# Patient Record
Sex: Female | Born: 1985 | Race: White | Hispanic: No | Marital: Single | Smoking: Former smoker
Health system: Southern US, Community
[De-identification: ages and names within clinical notes are randomized; demographics above are authoritative.]

## PROBLEM LIST (undated history)

## (undated) ENCOUNTER — Inpatient Hospital Stay (HOSPITAL_COMMUNITY): Payer: Self-pay

## (undated) DIAGNOSIS — N390 Urinary tract infection, site not specified: Secondary | ICD-10-CM

## (undated) DIAGNOSIS — F419 Anxiety disorder, unspecified: Secondary | ICD-10-CM

## (undated) DIAGNOSIS — IMO0002 Reserved for concepts with insufficient information to code with codable children: Secondary | ICD-10-CM

## (undated) DIAGNOSIS — M419 Scoliosis, unspecified: Secondary | ICD-10-CM

## (undated) DIAGNOSIS — E7800 Pure hypercholesterolemia, unspecified: Secondary | ICD-10-CM

## (undated) HISTORY — PX: OTHER SURGICAL HISTORY: SHX169

## (undated) HISTORY — PX: BACK SURGERY: SHX140

---

## 2009-01-23 ENCOUNTER — Encounter: Payer: Self-pay | Admitting: Pulmonary Disease

## 2009-06-25 ENCOUNTER — Emergency Department (HOSPITAL_COMMUNITY): Admission: EM | Admit: 2009-06-25 | Discharge: 2009-06-25 | Payer: Self-pay | Admitting: Family Medicine

## 2010-06-26 ENCOUNTER — Emergency Department (HOSPITAL_COMMUNITY): Admission: EM | Admit: 2010-06-26 | Discharge: 2010-06-26 | Payer: Self-pay | Admitting: Emergency Medicine

## 2010-11-06 ENCOUNTER — Emergency Department (HOSPITAL_COMMUNITY)
Admission: EM | Admit: 2010-11-06 | Discharge: 2010-11-06 | Payer: Self-pay | Source: Home / Self Care | Admitting: Family Medicine

## 2010-11-24 ENCOUNTER — Encounter: Payer: Self-pay | Admitting: Pulmonary Disease

## 2010-11-24 ENCOUNTER — Ambulatory Visit: Payer: Self-pay | Admitting: Pulmonary Disease

## 2010-11-24 DIAGNOSIS — K219 Gastro-esophageal reflux disease without esophagitis: Secondary | ICD-10-CM | POA: Insufficient documentation

## 2010-11-24 DIAGNOSIS — R0602 Shortness of breath: Secondary | ICD-10-CM | POA: Insufficient documentation

## 2010-11-24 DIAGNOSIS — Z9189 Other specified personal risk factors, not elsewhere classified: Secondary | ICD-10-CM | POA: Insufficient documentation

## 2010-11-24 DIAGNOSIS — J45909 Unspecified asthma, uncomplicated: Secondary | ICD-10-CM | POA: Insufficient documentation

## 2010-11-24 DIAGNOSIS — J309 Allergic rhinitis, unspecified: Secondary | ICD-10-CM | POA: Insufficient documentation

## 2010-12-16 ENCOUNTER — Ambulatory Visit
Admission: RE | Admit: 2010-12-16 | Discharge: 2010-12-16 | Payer: Self-pay | Source: Home / Self Care | Attending: Pulmonary Disease | Admitting: Pulmonary Disease

## 2010-12-16 ENCOUNTER — Other Ambulatory Visit: Payer: Self-pay | Admitting: Pulmonary Disease

## 2010-12-16 ENCOUNTER — Encounter: Payer: Self-pay | Admitting: Pulmonary Disease

## 2010-12-16 LAB — CBC WITH DIFFERENTIAL/PLATELET
Basophils Absolute: 0 10*3/uL (ref 0.0–0.1)
Basophils Relative: 0.3 % (ref 0.0–3.0)
Eosinophils Absolute: 0 10*3/uL (ref 0.0–0.7)
Eosinophils Relative: 0.5 % (ref 0.0–5.0)
HCT: 42.2 % (ref 36.0–46.0)
Hemoglobin: 14.7 g/dL (ref 12.0–15.0)
Lymphocytes Relative: 24.2 % (ref 12.0–46.0)
Lymphs Abs: 1.5 10*3/uL (ref 0.7–4.0)
MCHC: 34.8 g/dL (ref 30.0–36.0)
MCV: 98.8 fl (ref 78.0–100.0)
Monocytes Absolute: 0.6 10*3/uL (ref 0.1–1.0)
Monocytes Relative: 8.8 % (ref 3.0–12.0)
Neutro Abs: 4.2 10*3/uL (ref 1.4–7.7)
Neutrophils Relative %: 66.2 % (ref 43.0–77.0)
Platelets: 241 10*3/uL (ref 150.0–400.0)
RBC: 4.28 Mil/uL (ref 3.87–5.11)
RDW: 13.4 % (ref 11.5–14.6)
WBC: 6.4 10*3/uL (ref 4.5–10.5)

## 2010-12-16 LAB — BASIC METABOLIC PANEL
BUN: 14 mg/dL (ref 6–23)
CO2: 27 mEq/L (ref 19–32)
Calcium: 9.4 mg/dL (ref 8.4–10.5)
Chloride: 106 mEq/L (ref 96–112)
Creatinine, Ser: 0.7 mg/dL (ref 0.4–1.2)
GFR: 101.84 mL/min (ref >60.00)
Glucose, Bld: 69 mg/dL — ABNORMAL LOW (ref 70–99)
Potassium: 3.9 mEq/L (ref 3.5–5.1)
Sodium: 141 mEq/L (ref 135–145)

## 2010-12-16 LAB — HEPATIC FUNCTION PANEL
ALT: 12 U/L (ref 0–35)
AST: 17 U/L (ref 0–37)
Albumin: 4.1 g/dL (ref 3.5–5.2)
Alkaline Phosphatase: 60 U/L (ref 39–117)
Bilirubin, Direct: 0.1 mg/dL (ref 0.0–0.3)
Total Bilirubin: 0.6 mg/dL (ref 0.3–1.2)
Total Protein: 7.2 g/dL (ref 6.0–8.3)

## 2010-12-18 ENCOUNTER — Ambulatory Visit
Admission: RE | Admit: 2010-12-18 | Discharge: 2010-12-18 | Payer: Self-pay | Source: Home / Self Care | Attending: Pulmonary Disease | Admitting: Pulmonary Disease

## 2010-12-18 ENCOUNTER — Encounter: Payer: Self-pay | Admitting: Pulmonary Disease

## 2010-12-21 LAB — CONVERTED CEMR LAB: IgE (Immunoglobulin E), Serum: <1.5 intl units/mL (ref 0.0–180.0)

## 2010-12-23 ENCOUNTER — Encounter: Payer: Self-pay | Admitting: Pulmonary Disease

## 2010-12-24 ENCOUNTER — Encounter: Payer: Self-pay | Admitting: Pulmonary Disease

## 2010-12-29 ENCOUNTER — Telehealth (INDEPENDENT_AMBULATORY_CARE_PROVIDER_SITE_OTHER): Payer: Self-pay | Admitting: *Deleted

## 2011-01-07 ENCOUNTER — Ambulatory Visit: Admit: 2011-01-07 | Payer: Self-pay | Admitting: Pulmonary Disease

## 2011-01-07 ENCOUNTER — Encounter: Payer: Self-pay | Admitting: Pulmonary Disease

## 2011-01-07 ENCOUNTER — Ambulatory Visit (INDEPENDENT_AMBULATORY_CARE_PROVIDER_SITE_OTHER): Payer: Self-pay | Admitting: Pulmonary Disease

## 2011-01-07 DIAGNOSIS — K219 Gastro-esophageal reflux disease without esophagitis: Secondary | ICD-10-CM

## 2011-01-07 DIAGNOSIS — R0602 Shortness of breath: Secondary | ICD-10-CM

## 2011-01-07 DIAGNOSIS — Z9189 Other specified personal risk factors, not elsewhere classified: Secondary | ICD-10-CM

## 2011-01-07 DIAGNOSIS — J309 Allergic rhinitis, unspecified: Secondary | ICD-10-CM

## 2011-01-07 DIAGNOSIS — J45909 Unspecified asthma, uncomplicated: Secondary | ICD-10-CM

## 2011-01-07 NOTE — Miscellaneous (Signed)
Summary: Orders Update pft charges  Clinical Lists Changes  Orders: Added new Service order of Carbon Monoxide diffusing w/capacity (94720) - Signed Added new Service order of Lung Volumes (94240) - Signed Added new Service order of Spirometry (Pre & Post) (94060) - Signed 

## 2011-01-07 NOTE — Assessment & Plan Note (Signed)
Summary: rov 3 wks ///kp   Primary Provider/Referring Provider:  Dr. Cheri Rous in [place], [place]  CC:  3 month asthma follow up. Pt states breathing is the same. No new complaints.  History of Present Illness: 25 yo female with dyspnea, asthma, rhinitis, GERD, atypical chest pain, and ?vcd  She has been breathing better through her nose.  Her chest pain has resolved with therapy for reflux.  She still gets winded with walking after about 1/2 mile.  She feels like her asthma is coming on when she walks to much.  She feels like something is stuck in her throat.  She was confused about how to use her inhaler.  She is now using prevacid every other day.   Preventive Screening-Counseling & Management  Alcohol-Tobacco     Alcohol drinks/day: <1     Smoking Status: never  Current Medications (verified): 1)  Symbicort 160-4.5 Mcg/act Aero (Budesonide-Formoterol Fumarate) .... 2 Puffs Two Times A Day 2)  Proair Hfa 108 (90 Base) Mcg/act Aers (Albuterol Sulfate) .... 2 Puffs As Needed For Shortness of Breath 3)  Singulair 10 Mg Tabs (Montelukast Sodium) .... One By Mouth At Bedtime 4)  Prevacid 15 Mg Cpdr (Lansoprazole) .... Take 1 Tablet By Mouth Once A Day (Otc Pt Unsure of Strength) 5)  Fluticasone Propionate 50 Mcg/act Susp (Fluticasone Propionate) .... Two Sprays Once Daily  Allergies (verified): No Known Drug Allergies  Vital Signs:  Patient profile:   25 year old female Height:      67 inches Weight:      169.2 pounds BMI:     26.60 O2 Sat:      98 % on Room air Temp:     98.1 degrees F oral Pulse rate:   80 / minute BP sitting:   106 / 70  (left arm) Cuff size:   regular  Vitals Entered By: Zackery Barefoot CMA (December 16, 2010 9:11 AM)  O2 Flow:  Room air CC: 3 month asthma follow up. Pt states breathing is the same. No new complaints Comments Medications reviewed with patient Verified contact number and pharmacy with patient Zackery Barefoot CMA  December 16, 2010  9:11 AM    Physical Exam  General:  normal appearance and healthy appearing.   Nose:  Narrow nasal angles, clear drainage, no tenderness Mouth:  Retrognathic, MP 4, no exudate Neck:  no masses, thyromegaly, or abnormal cervical nodes Lungs:  clear bilaterally to auscultation and percussion Heart:  regular rate and rhythm, S1, S2 without murmurs, rubs, gallops, or clicks Extremities:  no clubbing, cyanosis, edema, or deformity noted Neurologic:  normal CN II-XII and strength normal.   Cervical Nodes:  no significant adenopathy Psych:  alert and cooperative; normal mood and affect; normal attention span and concentration   Impression & Recommendations:  Problem # 1:  DYSPNEA (ICD-786.05) She has partial improvement.  Will arrange for pulmonary function testing to further assess.  Will then determine is she needs to have CPST.  May also need further evaluation for VCD.  Problem # 2:  ASTHMA (ICD-493.90) She is to continue on symbicort, singulair, and as needed proair.  Reviewed proper use of her inhalers.  Will reassess after review of her labs and PFT.  Problem # 3:  ALLERGIC RHINITIS (ICD-477.9) Continue flonase and singulair.  Will check RAST, IgE, and hypersensitivity panel.  Problem # 4:  GERD (ICD-530.81) She can use as needed PPI.  Problem # 5:  CHEST PAIN, ATYPICAL, HX OF (ICD-V15.89) Likely from  GERD.  Improved with PPI therapy.  Medications Added to Medication List This Visit: 1)  Prevacid 15 Mg Cpdr (Lansoprazole) .... Take 1 tablet by mouth once a day (otc pt unsure of strength)  Complete Medication List: 1)  Symbicort 160-4.5 Mcg/act Aero (Budesonide-formoterol fumarate) .... 2 puffs two times a day 2)  Proair Hfa 108 (90 Base) Mcg/act Aers (Albuterol sulfate) .... 2 puffs as needed for shortness of breath 3)  Singulair 10 Mg Tabs (Montelukast sodium) .... One by mouth at bedtime 4)  Prevacid 15 Mg Cpdr (Lansoprazole) .... Take 1 tablet by mouth once a day (otc pt  unsure of strength) 5)  Fluticasone Propionate 50 Mcg/act Susp (Fluticasone propionate) .... Two sprays once daily  Other Orders: Est. Patient Level IV (16109) Full Pulmonary Function Test (PFT) TLB-BMP (Basic Metabolic Panel-BMET) (80048-METABOL) TLB-CBC Platelet - w/Differential (85025-CBCD) TLB-Hepatic/Liver Function Pnl (80076-HEPATIC) T-"RAST" (Allergy Full Profile) IGE (60454-09811) T-Hypersens Panel (86331/86609-85902)  Patient Instructions: 1)  Lab test today 2)  Will schedule breathing test (PFT) 3)  Continue symbicort two puffs two times a day 4)  Continue singulair 10mg  at bedtime 5)  Continue flonase two sprays once daily 6)  Prevacid 15 mg as needed for heartburn 7)  Follow up in 3 to 4 weeks

## 2011-01-07 NOTE — Procedures (Signed)
Summary: Spirometry / [place]  Spirometry / [facility]   Imported By: Lennie Odor 12/28/2010 15:39:35  _____________________________________________________________________  External Attachment:    Type:   Image     Comment:   External Document

## 2011-01-07 NOTE — Assessment & Plan Note (Signed)
Summary: self-referral SOB//jrc   Primary Provider/Referring Provider:  Dr. Cheri Rous in [place], [place]  CC:  Patient is self referred for asthma evaluation...pt c/o increased SOB x3 months and using her Proair 5-6 times daily.  History of Present Illness: 25 yo female fore evaluation of dyspnea in setting of asthma.  She was diagnosed with asthma at age 22.  She had trouble with her breathing associated with cough and wheeze.  This started after she had a cold.  She has used inhaler therapy intermittently since then, and currently has been using symbicort and proair.  These have helped, but don't seem to be as effective now.  As a result she has decided to have further pulmonary evaluation.  She was seen in the emergency room on Dec. 2 for throat swelling.  She was given prednisone and flonase.  It does not appear that she used either of these.  She states that she has not been on prednisone for the past year, and has not used nasal sprays before.  She has been getting a runny nose.  She feels like something is stuck in her throat, but she can't really cough anything up.  She notices her cough is worse with activity.  She has been getting chest tightness, but not so much wheeze.  She feels she has more trouble getting air into her lungs.  She denies fever, hemoptysis, epistaxis, rashes, gland swelling, or joint swelling.  She gets frequent episodes of heartburn.  This happens at least twice per day.  She is not taking any medicine for this.  She gets seasonal allergies.  She has noticed most problems related to dust and pollen.  Summer seems to be her worst season.  She has never had allergy testing done before.  She has never seen an allergist or pulmonologist before.  She had PFT's years ago.  There is no history of pneumonia or TB.  She denies smoking, but has second hand exposure from her Father and boyfriend.  She works as a Advertising copywriter, and has noticed trouble around cleaning product  fumes.  Her Father breeds birds.  She had to move out of his house because the exposure to the birds made her breathing worse.  She does have a pet parrot.  She denies recent sick exposures or travel history.  She had difficulty doing spirometry today, and therefore test can not be interpretted.  CXR  Procedure date:  06/26/2010  Findings:       CHEST - 2 VIEW    Comparison: None.    Findings: The lungs are clear bilaterally.  There are no confluent   airspace opacities, pleural effusions or pneumothoraces seen. A   metallic foreign body seen projecting in the mid chest, correlated   to a piercing.  The cardiac silhouette is normal in size and   contour. The upper abdomen and osseous structures are unremarkable.    IMPRESSION:   No acute cardiopulmonary disease.  CXR  Procedure date:  11/24/2010  Findings:      CHEST - 2 VIEW    Comparison: June 26, 2010    Findings: The cardiac silhouette, mediastinum, pulmonary   vasculature are within normal limits.  Both lungs are clear.   There is no acute bony abnormality.    IMPRESSION:   There is no evidence of acute cardiac or pulmonary process.   Imaging Exam  Procedure date:  11/06/2010  Findings:      NECK SOFT TISSUES - 1+ VIEW  Comparison: None.    Findings: No opaque foreign body is seen.  The hypopharyngeal   airway appears normal and the epiglottis appears normal.  The   cervical vertebrae are in normal alignment.    IMPRESSION:   Negative lateral soft tissue view of the neck.  No opaque foreign   body is seen.    Preventive Screening-Counseling & Management  Alcohol-Tobacco     Alcohol drinks/day: <1     Smoking Status: never  Current Medications (verified): 1)  Symbicort 160-4.5 Mcg/act Aero (Budesonide-Formoterol Fumarate) .... 2 Puffs Two Times A Day 2)  Proair Hfa 108 (90 Base) Mcg/act Aers (Albuterol Sulfate) .... 2 Puffs As Needed For Shortness of Breath  Allergies (verified): No Known  Drug Allergies  Past History:  Past Medical History: Asthma Allergic rhinitis Atypical chest pain  Past Surgical History: None  Family History: Paternal grandfather - Prostate cancer, Heart disease, Bone Cancer, DM Paternal grandmother - Colon cancer, allergies  Social History: Single.  No children.  Works as Advertising copywriter.  Second hand tobacco exposure from Father and Boyfriend.Smoking Status:  never Alcohol drinks/day:  <1  Review of Systems       The patient complains of shortness of breath with activity, productive cough, acid heartburn, loss of appetite, tooth/dental problems, and anxiety.  The patient denies shortness of breath at rest, non-productive cough, coughing up blood, chest pain, irregular heartbeats, indigestion, weight change, abdominal pain, difficulty swallowing, sore throat, headaches, nasal congestion/difficulty breathing through nose, sneezing, itching, ear ache, depression, hand/feet swelling, joint stiffness or pain, rash, change in color of mucus, and fever.    Vital Signs:  Patient profile:   25 year old female Height:      67 inches (170.18 cm) Weight:      172.38 pounds (78.35 kg) BMI:     27.10 O2 Sat:      100 % on Room air Temp:     98.1 degrees F (36.72 degrees C) oral Pulse rate:   91 / minute BP sitting:   108 / 70  (left arm) Cuff size:   regular  Vitals Entered By: Michel Bickers CMA (November 24, 2010 3:43 PM)  O2 Sat at Rest %:  100 O2 Flow:  Room air CC: Patient is self referred for asthma evaluation...pt c/o increased SOB x3 months and using her Proair 5-6 times daily Is Patient Diabetic? No Comments Medications reviewed with patient Michel Bickers [facility]  November 24, 2010 3:44 PM   Physical Exam  General:  normal appearance and healthy appearing.   Eyes:  PERRLA and EOMI.   Ears:  TMs intact and clear with normal canals Nose:  Narrow nasal angles, clear drainage, no tenderness Mouth:  Retrognathic, MP 4, no exudate Neck:  no masses,  thyromegaly, or abnormal cervical nodes Chest Wall:  no deformities noted Lungs:  clear bilaterally to auscultation and percussion Heart:  regular rate and rhythm, S1, S2 without murmurs, rubs, gallops, or clicks Abdomen:  bowel sounds positive; abdomen soft and non-tender without masses, or organomegaly Msk:  no deformity or scoliosis noted with normal posture Pulses:  pulses normal Extremities:  no clubbing, cyanosis, edema, or deformity noted Neurologic:  normal CN II-XII and strength normal.   Skin:  intact without lesions or rashes Cervical Nodes:  no significant adenopathy Psych:  alert and cooperative; normal mood and affect; normal attention span and concentration   Impression & Recommendations:  Problem # 1:  DYSPNEA (ICD-786.05) This is likely related to asthma.  She  may also have a component of vocal cord dysfunction related to rhinitis with post-nasal drip and reflux.  She does have exposure to birds, but her chest xray and clinical exam are not in favor of allergic alveolitis as a cause of her dyspnea.  There is nothing in her history or exam to suggest cardiac disease, thrombo-embolic disease, interstitial lung disease or muscle weakness as a cause of her dyspnea.  Will defer evaluation for these disorders.  Problem # 2:  ASTHMA (ICD-493.90) Will continue symbicort and as needed proair.  Will add singulair to her regimen.  Depending on her response she may need further pulmonary function testing.  Problem # 3:  ALLERGIC RHINITIS (ICD-477.9) She likely has post-nasal drip contributing to her symptoms.  Will have her use nasal irrigation and fluticasone on a regular basis until her return visit.  If she has not improved, she would then need further allergy testing.  Problem # 4:  GERD (ICD-530.81) She likely has reflux contributing to her upper airway irritation.  Will have her use omeprazole once daily until return visit, and then re-assess whether she needs further  evaluation for reflux.  Problem # 5:  CHEST PAIN, ATYPICAL, HX OF (ICD-V15.89) This may be related to reflux.    Medications Added to Medication List This Visit: 1)  Symbicort 160-4.5 Mcg/act Aero (Budesonide-formoterol fumarate) .... 2 puffs two times a day 2)  Proair Hfa 108 (90 Base) Mcg/act Aers (Albuterol sulfate) .... 2 puffs as needed for shortness of breath 3)  Singulair 10 Mg Tabs (Montelukast sodium) .... One by mouth at bedtime 4)  Omeprazole 20 Mg Cpdr (Omeprazole) .... One by mouth once daily 5)  Fluticasone Propionate 50 Mcg/act Susp (Fluticasone propionate) .... Two sprays once daily  Complete Medication List: 1)  Symbicort 160-4.5 Mcg/act Aero (Budesonide-formoterol fumarate) .... 2 puffs two times a day 2)  Proair Hfa 108 (90 Base) Mcg/act Aers (Albuterol sulfate) .... 2 puffs as needed for shortness of breath 3)  Singulair 10 Mg Tabs (Montelukast sodium) .... One by mouth at bedtime 4)  Omeprazole 20 Mg Cpdr (Omeprazole) .... One by mouth once daily 5)  Fluticasone Propionate 50 Mcg/act Susp (Fluticasone propionate) .... Two sprays once daily  Other Orders: Spirometry w/Graph (94010) New Patient Level IV (16109) T-2 View CXR (71020TC)  Patient Instructions: 1)  Continue symbicort two puffs two times a day 2)  Singulair 10 mg at bedtime 3)  Nasal irrigation (Salt water saline spray) once daily  4)  Fluticasone two sprays in each nostril once daily  5)  Omeprazole 20 mg once daily for heartburn 6)  Follow up in 3 weeks Prescriptions: FLUTICASONE PROPIONATE 50 MCG/ACT SUSP (FLUTICASONE PROPIONATE) two sprays once daily  #1 x 3   Entered and Authorized by:   Coralyn Helling MD   Signed by:   Coralyn Helling MD on 11/24/2010   Method used:   Print then Give to Patient   RxID:   [id number] OMEPRAZOLE 20 MG CPDR (OMEPRAZOLE) one by mouth once daily  #30 x 3   Entered and Authorized by:   Coralyn Helling MD   Signed by:   Coralyn Helling MD on 11/24/2010   Method used:   Print  then Give to Patient   RxID:   [id number] SINGULAIR 10 MG TABS (MONTELUKAST SODIUM) one by mouth at bedtime  #30 x 3   Entered and Authorized by:   Coralyn Helling MD   Signed by:   Coralyn Helling MD on 11/24/2010  Method used:   Print then Give to Patient   RxID:   [id number]    Immunization History:  Influenza Immunization History:    Influenza:  historical (09/05/2010)

## 2011-01-07 NOTE — Miscellaneous (Signed)
Summary: Pulmonary function test   Pulmonary Function Test Date: 12/18/2010 Height (in.): 67 Gender: Female  Pre-Spirometry FVC    Value: 3.92 L/min   Pred: 4.28 L/min     % Pred: 92 % FEV1    Value: 3.46 L     Pred: 3.43 L     % Pred: 101 % FEV1/FVC  Value: 88 %     Pred: 80 %     % Pred: . % FEF 25-75  Value: 4.30 L/min   Pred: 3.85 L/min     % Pred: 112 %  Post-Spirometry FVC    Value: 3.92 L/min   Pred: 4.28 L/min     % Pred: 92 % FEV1    Value: 3.54 L     Pred: 3.43 L     % Pred: 102 % FEV1/FVC  Value: 90 %     Pred: 80 %     % Pred: . % FEF 25-75  Value: 4.55 L/min   Pred: 3.85 L/min     % Pred: 118 %  Lung Volumes TLC    Value: 5.08 L   % Pred: 88 % RV    Value: 1.13 L   % Pred: 64 % DLCO    Value: 25.6 %   % Pred: 95 % DLCO/VA  Value: 5.78 %   % Pred: 126 %  Comments: Normal spirometry.  No bronchodilator response.  Isolated decrease in residual volume, otherwise normal lung volumes.  Normal diffusion capacity. Clinical Lists Changes  Observations: Added new observation of PFT COMMENTS: Normal spirometry.  No bronchodilator response.  Isolated decrease in residual volume, otherwise normal lung volumes.  Normal diffusion capacity. (12/18/2010 13:15) Added new observation of DLCO/VA%EXP: 126 % (12/18/2010 13:15) Added new observation of DLCO/VA: 5.78 % (12/18/2010 13:15) Added new observation of DLCO % EXPEC: 95 % (12/18/2010 13:15) Added new observation of DLCO: 25.6 % (12/18/2010 13:15) Added new observation of RV % EXPECT: 64 % (12/18/2010 13:15) Added new observation of RV: 1.13 L (12/18/2010 13:15) Added new observation of TLC % EXPECT: 88 % (12/18/2010 13:15) Added new observation of TLC: 5.08 L (12/18/2010 13:15) Added new observation of FEF2575%EXPS: 118 % (12/18/2010 13:15) Added new observation of PSTFEF25/75P: 3.85  (12/18/2010 13:15) Added new observation of PSTFEF25/75%: 4.55 L/min (12/18/2010 13:15) Added new observation of PSTFEV1/FCV%: . % (12/18/2010  13:15) Added new observation of FEV1FVCPRDPS: 80 % (12/18/2010 13:15) Added new observation of PSTFEV1/FVC: 90 % (12/18/2010 13:15) Added new observation of POSTFEV1%PRD: 102 % (12/18/2010 13:15) Added new observation of FEV1PRDPST: 3.43 L (12/18/2010 13:15) Added new observation of POST FEV1: 3.54 L/min (12/18/2010 13:15) Added new observation of POST FVC%EXP: 92 % (12/18/2010 13:15) Added new observation of FVCPRDPST: 4.28 L/min (12/18/2010 13:15) Added new observation of POST FVC: 3.92 L (12/18/2010 13:15) Added new observation of FEF % EXPEC: 112 % (12/18/2010 13:15) Added new observation of FEF25-75%PRE: 3.85 L/min (12/18/2010 13:15) Added new observation of FEF 25-75%: 4.30 L/min (12/18/2010 13:15) Added new observation of FEV1/FVC%EXP: . % (12/18/2010 13:15) Added new observation of FEV1/FVC PRE: 80 % (12/18/2010 13:15) Added new observation of FEV1/FVC: 88 % (12/18/2010 13:15) Added new observation of FEV1 % EXP: 101 % (12/18/2010 13:15) Added new observation of FEV1 PREDICT: 3.43 L (12/18/2010 13:15) Added new observation of FEV1: 3.46 L (12/18/2010 13:15) Added new observation of FVC % EXPECT: 92 % (12/18/2010 13:15) Added new observation of FVC PREDICT: 4.28 L (12/18/2010 13:15) Added new observation of FVC: 3.92 L (12/18/2010 13:15) Added new  observation of PFT HEIGHT: 67  (12/18/2010 13:15) Added new observation of PFT DATE: 12/18/2010  (12/18/2010 13:15)

## 2011-01-07 NOTE — Letter (Signed)
Summary: [facility]  [facility]   Imported By: Lester Richardton 12/10/2010 08:34:19  _____________________________________________________________________  External Attachment:    Type:   Image     Comment:   External Document

## 2011-01-07 NOTE — Progress Notes (Signed)
Summary: returning a call  Phone Note Call from Patient   Caller: Patient Call For: dr sood Summary of Call: patient phoned stated that she was returning a call from the nurse. She can be reached [phone] Initial call taken by: Vedia Coffer,  December 29, 2010 10:50 AM  Follow-up for Phone Call        Spoke with pt and notified of results/recs per VS append to labs.  Pt verbalized understanding. Follow-up by: Vernie Murders,  December 29, 2010 10:54 AM

## 2011-01-07 NOTE — Letter (Signed)
Summary: [facility] Results Follow Up Letter  [facility]  [address]   [place], [place] [address]   Phone: [phone]  Fax: [phone]    12/24/2010 MRN: [id number]  JASENIA WEILBACHER [address] [place], [place]  [address]  Dear Ms. Sahota,  We have received the results from your recent tests and have been unable to contact you.  Please call our office at [phone] so that Dr. Craige Cotta or his nurse may review the results with you.    Thank you,  Nature conservation officer Pulmonary Division

## 2011-01-13 NOTE — Assessment & Plan Note (Signed)
Summary: 3/4 week rov   Vital Signs:  Patient profile:   25 year old female Height:      67 inches Weight:      171.25 pounds BMI:     26.92 O2 Sat:      95 % on Room air Temp:     98.4 degrees F oral Pulse rate:   109 / minute BP sitting:   110 / 74  (left arm) Cuff size:   regular  Vitals Entered By: Carver Fila (January 07, 2011 3:53 PM)  O2 Flow:  Room air CC: follow up. Pt states her breathing is getting better but still has some sob but is getting better. Pt denies any cough.  Comments meds and allergies updated Phone number updated Carver Fila  January 07, 2011 3:53 PM    Primary Provider/Referring Provider:  Dr. Cheri Rous in [place], [place]  CC:  follow up. Pt states her breathing is getting better but still has some sob but is getting better. Pt denies any cough. .  History of Present Illness: 25 yo female with dyspnea, asthma, rhinitis, GERD, atypical chest pain, and ?vcd  She has been doing better.  She has not needed to use proair recently.  She is only using prevacid as needed.  She continues to use symbicort and singulair w/o problem.  She has started exercising, and feels like this helps.  Her sinuses have been okay.  Pulmonary function test Dec 18, 2009>>Normal spirometry.  No bronchodilator response.  Isolated decrease in residual volume, otherwise normal lung volumes.  Normal diffusion capacity.  Preventive Screening-Counseling & Management  Alcohol-Tobacco     Smoking Status: quit  Current Medications (verified): 1)  Symbicort 160-4.5 Mcg/act Aero (Budesonide-Formoterol Fumarate) .... 2 Puffs Two Times A Day 2)  Proair Hfa 108 (90 Base) Mcg/act Aers (Albuterol Sulfate) .... 2 Puffs As Needed For Shortness of Breath 3)  Singulair 10 Mg Tabs (Montelukast Sodium) .... One By Mouth At Bedtime 4)  Prevacid 15 Mg Cpdr (Lansoprazole) .... Take 1 Tablet By Mouth Once A Day (Otc Pt Unsure of Strength) As Needed 5)  Fluticasone Propionate 50 Mcg/act Susp  (Fluticasone Propionate) .... Two Sprays Once Daily As Needed  Allergies (verified): No Known Drug Allergies  Past History:  Past Medical History: Last updated: 11/24/2010 Asthma Allergic rhinitis Atypical chest pain  Past Surgical History: Last updated: 11/24/2010 None  Family History: Last updated: 11/24/2010 Paternal grandfather - Prostate cancer, Heart disease, Bone Cancer, DM Paternal grandmother - Colon cancer, allergies  Social History: Last updated: 01/07/2011 Single.  No children.  Works as Advertising copywriter.  Second hand tobacco exposure from Father and Boyfriend. Patient states former smoker. Quit 2005. 1/2 ppd. started age 75  Social History: Single.  No children.  Works as Advertising copywriter.  Second hand tobacco exposure from Father and Boyfriend. Patient states former smoker. Quit 2005. 1/2 ppd. started age 61 Smoking Status:  quit  Physical Exam  General:  normal appearance and healthy appearing.   Nose:  Narrow nasal angles, clear drainage, no tenderness Mouth:  Retrognathic, MP 4, no exudate Neck:  no masses, thyromegaly, or abnormal cervical nodes Lungs:  clear bilaterally to auscultation and percussion Heart:  regular rate and rhythm, S1, S2 without murmurs, rubs, gallops, or clicks Extremities:  no clubbing, cyanosis, edema, or deformity noted Neurologic:  normal CN II-XII and strength normal.   Cervical Nodes:  no significant adenopathy Psych:  alert and cooperative; normal mood and affect; normal attention span and  concentration   Impression & Recommendations:  Problem # 1:  DYSPNEA (ICD-786.05)  She has continued improvement with therapy.  Problem # 2:  ASTHMA (ICD-493.90) She is to continue symbicort and singulair for now.  I have advised her that she could try to stop singulair, and monitor her response.  Problem # 3:  ALLERGIC RHINITIS (ICD-477.9)  Continue flonase.  Problem # 4:  GERD (ICD-530.81) Improved.  She can continue as needed as  needed prevacid.  Problem # 5:  CHEST PAIN, ATYPICAL, HX OF (ICD-V15.89) Resolved, likely related reflux.  Medications Added to Medication List This Visit: 1)  Prevacid 15 Mg Cpdr (Lansoprazole) .... Take 1 tablet by mouth once a day (otc pt unsure of strength) as needed 2)  Fluticasone Propionate 50 Mcg/act Susp (Fluticasone propionate) .... Two sprays once daily as needed  Complete Medication List: 1)  Symbicort 160-4.5 Mcg/act Aero (Budesonide-formoterol fumarate) .... 2 puffs two times a day 2)  Proair Hfa 108 (90 Base) Mcg/act Aers (Albuterol sulfate) .... 2 puffs as needed for shortness of breath 3)  Singulair 10 Mg Tabs (Montelukast sodium) .... One by mouth at bedtime 4)  Prevacid 15 Mg Cpdr (Lansoprazole) .... Take 1 tablet by mouth once a day (otc pt unsure of strength) as needed 5)  Fluticasone Propionate 50 Mcg/act Susp (Fluticasone propionate) .... Two sprays once daily as needed  Other Orders: Est. Patient Level III (16109)  Patient Instructions: 1)  Follow up in 2 to 3 months   Orders Added: 1)  Est. Patient Level III [60454]

## 2011-02-16 LAB — POCT PREGNANCY, URINE: Preg Test, Ur: NEGATIVE

## 2011-02-20 LAB — BASIC METABOLIC PANEL
BUN: 11 mg/dL (ref 6–23)
CO2: 24 mEq/L (ref 19–32)
Chloride: 110 mEq/L (ref 96–112)
Glucose, Bld: 87 mg/dL (ref 70–99)
Potassium: 3.7 mEq/L (ref 3.5–5.1)
Sodium: 141 mEq/L (ref 135–145)

## 2011-02-20 LAB — DIFFERENTIAL
Basophils Relative: 1 % (ref 0–1)
Eosinophils Absolute: 0.1 10*3/uL (ref 0.0–0.7)
Eosinophils Relative: 1 % (ref 0–5)
Monocytes Absolute: 0.4 10*3/uL (ref 0.1–1.0)
Monocytes Relative: 6 % (ref 3–12)
Neutro Abs: 3.3 10*3/uL (ref 1.7–7.7)

## 2011-02-20 LAB — URINALYSIS, ROUTINE W REFLEX MICROSCOPIC
Bilirubin Urine: NEGATIVE
Hgb urine dipstick: NEGATIVE
Ketones, ur: NEGATIVE mg/dL
Specific Gravity, Urine: 1.034 — ABNORMAL HIGH (ref 1.005–1.030)
pH: 6.5 (ref 5.0–8.0)

## 2011-02-20 LAB — CBC
HCT: 40.2 % (ref 36.0–46.0)
Hemoglobin: 13.8 g/dL (ref 12.0–15.0)
MCH: 33.9 pg (ref 26.0–34.0)
MCHC: 34.4 g/dL (ref 30.0–36.0)
MCV: 98.6 fL (ref 78.0–100.0)
RDW: 12.4 % (ref 11.5–15.5)

## 2011-02-20 LAB — POCT CARDIAC MARKERS: Myoglobin, poc: 89.5 ng/mL (ref 12–200)

## 2011-03-14 LAB — URINE CULTURE: Colony Count: >=100000

## 2011-03-14 LAB — POCT URINALYSIS DIP (DEVICE)
Bilirubin Urine: NEGATIVE
Glucose, UA: NEGATIVE mg/dL
Specific Gravity, Urine: 1.02 (ref 1.005–1.030)
Urobilinogen, UA: 0.2 mg/dL (ref 0.0–1.0)

## 2011-03-14 LAB — POCT PREGNANCY, URINE: Preg Test, Ur: NEGATIVE

## 2011-07-23 ENCOUNTER — Emergency Department (HOSPITAL_COMMUNITY)
Admission: EM | Admit: 2011-07-23 | Discharge: 2011-07-23 | Disposition: A | Discharge status: Home / Self Care | Payer: BC Managed Care – PPO | Attending: Emergency Medicine | Admitting: Emergency Medicine

## 2011-07-23 DIAGNOSIS — J45909 Unspecified asthma, uncomplicated: Secondary | ICD-10-CM | POA: Insufficient documentation

## 2011-10-03 ENCOUNTER — Emergency Department (HOSPITAL_COMMUNITY)
Admission: EM | Admit: 2011-10-03 | Discharge: 2011-10-03 | Disposition: A | Discharge status: Home / Self Care | Payer: BC Managed Care – PPO | Attending: Emergency Medicine | Admitting: Emergency Medicine

## 2011-10-03 DIAGNOSIS — J45909 Unspecified asthma, uncomplicated: Secondary | ICD-10-CM | POA: Insufficient documentation

## 2011-10-03 DIAGNOSIS — R059 Cough, unspecified: Secondary | ICD-10-CM

## 2011-10-03 DIAGNOSIS — R0602 Shortness of breath: Secondary | ICD-10-CM | POA: Insufficient documentation

## 2011-10-03 DIAGNOSIS — J3489 Other specified disorders of nose and nasal sinuses: Secondary | ICD-10-CM | POA: Insufficient documentation

## 2011-10-03 DIAGNOSIS — F17200 Nicotine dependence, unspecified, uncomplicated: Secondary | ICD-10-CM | POA: Insufficient documentation

## 2011-10-03 DIAGNOSIS — R05 Cough: Secondary | ICD-10-CM | POA: Insufficient documentation

## 2011-10-03 MED ORDER — HYDROCOD POLST-CHLORPHEN POLST 10-8 MG/5ML PO LQCR: ORAL | Status: DC

## 2011-10-03 MED ORDER — PREDNISONE 10 MG PO TABS: ORAL_TABLET | ORAL | Status: DC

## 2011-10-03 MED ORDER — BUDESONIDE-FORMOTEROL FUMARATE 80-4.5 MCG/ACT IN AERO: 2.0000 | INHALATION_SPRAY | Freq: Two times a day (BID) | RESPIRATORY_TRACT | Status: DC

## 2011-10-03 MED ORDER — ALBUTEROL SULFATE HFA 108 (90 BASE) MCG/ACT IN AERS: 1.0000 | INHALATION_SPRAY | Freq: Four times a day (QID) | RESPIRATORY_TRACT | Status: DC | PRN

## 2011-10-03 NOTE — ED Notes (Signed)
Cough, congestion and sore chest

## 2011-10-03 NOTE — ED Notes (Signed)
Pt c/o sore throat cough and congestion. Pt states she has had sx since Thursday. Pt states her chest is now sore from coughing. Pts longs clear bilat, no cough present at this time.

## 2011-10-03 NOTE — ED Provider Notes (Signed)
History     CSN: [id number] Arrival date & time: 10/03/2011 10:06 AM   First MD Initiated Contact with Patient 10/03/11 1005      Chief Complaint  Patient presents with  . Nasal Congestion    (Consider location/radiation/quality/duration/timing/severity/associated sxs/prior treatment) HPI Comments: Patient c/o non-productive cough, sore throat and nasal congestion for 3 days.  States she has recently ran out of her albuterol and symbicort inhalers.  C/o wheezing and shortness of breath with exertion.  She denies fever, neck pain or stiffness.    Patient is a 25 y.o. female presenting with cough. The history is provided by the patient.  Cough This is a new problem. The current episode started more than 2 days ago. The problem occurs every few minutes. The problem has not changed since onset.The cough is non-productive. There has been no fever. Associated symptoms include rhinorrhea, sore throat, shortness of breath and wheezing. Pertinent negatives include no chest pain, no chills, no ear congestion, no ear pain, no headaches, no myalgias and no eye redness. She has tried nothing for the symptoms. The treatment provided no relief. She is a smoker. Her past medical history is significant for asthma. Her past medical history does not include bronchitis, pneumonia or COPD.    Past Medical History  Diagnosis Date  . Asthma     History reviewed. No pertinent past surgical history.  History reviewed. No pertinent family history.  History  Substance Use Topics  . Smoking status: Current Everyday Smoker  . Smokeless tobacco: Not on file  . Alcohol Use: No    OB History    Grav Para Term Preterm Abortions TAB SAB Ect Mult Living                  Review of Systems  Constitutional: Negative for fever, chills and fatigue.  HENT: Positive for sore throat, rhinorrhea and sneezing. Negative for ear pain, facial swelling, trouble swallowing, neck pain and neck stiffness.   Eyes:  Negative for redness.  Respiratory: Positive for cough, shortness of breath and wheezing. Negative for chest tightness.   Cardiovascular: Negative for chest pain.  Gastrointestinal: Negative for nausea, vomiting and abdominal pain.  Genitourinary: Negative for dysuria, hematuria and flank pain.  Musculoskeletal: Negative for myalgias, back pain and arthralgias.  Skin: Negative for rash.  Neurological: Negative for dizziness, weakness, numbness and headaches.  Hematological: Does not bruise/bleed easily.  All other systems reviewed and are negative.    Allergies  Review of patient's allergies indicates no known allergies.  Home Medications  No current outpatient prescriptions on file.  BP 106/78  Pulse 113  Temp(Src) 98.1 F (36.7 C) (Oral)  Resp 18  Ht 5\' 7"  (1.702 m)  Wt 143 lb 7 oz (65.063 kg)  BMI 22.47 kg/m2  SpO2 100%  LMP 10/01/2011  Physical Exam  Nursing note and vitals reviewed. Constitutional: She is oriented to person, place, and time. She appears well-developed and well-nourished. No distress.  HENT:  Head: Normocephalic and atraumatic. No trismus in the jaw.  Right Ear: Tympanic membrane normal. No mastoid tenderness. No hemotympanum.  Left Ear: Tympanic membrane normal. No mastoid tenderness. No hemotympanum.  Nose: Mucosal edema and rhinorrhea present.  Mouth/Throat: Uvula is midline, oropharynx is clear and moist and mucous membranes are normal. No uvula swelling.  Neck: Normal range of motion. Neck supple.  Cardiovascular: Normal rate, regular rhythm and normal heart sounds.   Pulmonary/Chest: Effort normal. No respiratory distress. She has wheezes. She has no rales.  She exhibits no tenderness.  Abdominal: Soft. She exhibits no distension. There is no tenderness.  Musculoskeletal: Normal range of motion. She exhibits no tenderness.  Lymphadenopathy:    She has no cervical adenopathy.  Neurological: She is alert and oriented to person, place, and time. No  cranial nerve deficit. She exhibits normal muscle tone. Coordination normal.  Skin: Skin is warm and dry.  Psychiatric: She has a normal mood and affect.    ED Course  Procedures (including critical care time)       MDM    10:32 AM patient is alert, NAD.  Non-toxic appearing. Few scattered expiratory wheezes on exam.  Vitals stable.  Has hx of asthma and currently out of her inhalers.  Sx's today are likely related to URI.  Lung sounds cleared with active coughing.  No fever, hypoxia, tachycardia or tachypnea.  Patient agrees to close f/u with her PMD   Pt feels improved after observation and/or treatment in ED.   Patient / Family / Caregiver understand and agree with initial ED impression and plan with expectations set for ED visit.   OUTPATIENT MEDICATIONS PRESCRIBED FROM THE ED:   New Prescriptions   ALBUTEROL (PROVENTIL HFA;VENTOLIN HFA) 108 (90 BASE) MCG/ACT INHALER    Inhale 1-2 puffs into the lungs every 6 (six) hours as needed for wheezing.   BUDESONIDE-FORMOTEROL (SYMBICORT) 80-4.5 MCG/ACT INHALER    Inhale 2 puffs into the lungs 2 (two) times daily.   CHLORPHENIRAMINE-HYDROCODONE (TUSSIONEX PENNKINETIC ER) 10-8 MG/5ML LQCR    Take 5 ml po q 12 hrs prn cough   PREDNISONE (DELTASONE) 10 MG TABLET    Take 6 tablets day one, 5 tablets day two, 4 tablets day three, 3 tablets day four, 2 tablets day five, then 1 tablet day six        Pierson Vantol L. [place], [place] 10/06/11 1639

## 2011-10-04 ENCOUNTER — Telehealth: Payer: Self-pay | Admitting: *Deleted

## 2011-10-04 NOTE — Telephone Encounter (Signed)
[email or web address]

## 2011-10-04 NOTE — Telephone Encounter (Signed)
Message copied by Tommie Sams on Mon Oct 04, 2011  8:37 AM ------      Message from: Coralyn Helling      Created: Sun Oct 03, 2011 11:52 AM       [name] was in ED for asthma exacerbation.  Please schedule her for ROV to further assess.  Thanks.

## 2011-10-05 NOTE — Telephone Encounter (Signed)
[email or web address]  

## 2011-10-06 NOTE — Telephone Encounter (Signed)
[email or web address]  

## 2011-10-07 NOTE — ED Provider Notes (Signed)
Medical screening examination/treatment/procedure(s) were performed by non-physician practitioner and as supervising physician I was immediately available for consultation/collaboration.   Debarah Mccumbers W. Justiss Gerbino, MD 10/07/11 0822 

## 2011-10-08 NOTE — Telephone Encounter (Signed)
[email or web address]  

## 2011-10-11 ENCOUNTER — Encounter: Payer: Self-pay | Admitting: *Deleted

## 2011-10-11 NOTE — Telephone Encounter (Signed)
[email or web address]  

## 2011-10-11 NOTE — Telephone Encounter (Signed)
Will send letter out to pt home advising her to call our office back

## 2011-11-24 ENCOUNTER — Encounter (HOSPITAL_COMMUNITY): Payer: Self-pay | Admitting: *Deleted

## 2011-11-24 ENCOUNTER — Emergency Department (HOSPITAL_COMMUNITY)
Admission: EM | Admit: 2011-11-24 | Discharge: 2011-11-24 | Disposition: A | Discharge status: Home / Self Care | Payer: BC Managed Care – PPO | Attending: Emergency Medicine | Admitting: Emergency Medicine

## 2011-11-24 DIAGNOSIS — J45909 Unspecified asthma, uncomplicated: Secondary | ICD-10-CM | POA: Insufficient documentation

## 2011-11-24 DIAGNOSIS — R6883 Chills (without fever): Secondary | ICD-10-CM | POA: Insufficient documentation

## 2011-11-24 DIAGNOSIS — R059 Cough, unspecified: Secondary | ICD-10-CM | POA: Insufficient documentation

## 2011-11-24 DIAGNOSIS — R51 Headache: Secondary | ICD-10-CM | POA: Insufficient documentation

## 2011-11-24 DIAGNOSIS — R05 Cough: Secondary | ICD-10-CM | POA: Insufficient documentation

## 2011-11-24 DIAGNOSIS — J111 Influenza due to unidentified influenza virus with other respiratory manifestations: Secondary | ICD-10-CM | POA: Insufficient documentation

## 2011-11-24 DIAGNOSIS — IMO0001 Reserved for inherently not codable concepts without codable children: Secondary | ICD-10-CM | POA: Insufficient documentation

## 2011-11-24 DIAGNOSIS — R61 Generalized hyperhidrosis: Secondary | ICD-10-CM | POA: Insufficient documentation

## 2011-11-24 DIAGNOSIS — F17200 Nicotine dependence, unspecified, uncomplicated: Secondary | ICD-10-CM | POA: Insufficient documentation

## 2011-11-24 MED ORDER — HYDROCOD POLST-CHLORPHEN POLST 10-8 MG/5ML PO LQCR: 5.0000 mL | Freq: Two times a day (BID) | ORAL | Status: DC

## 2011-11-24 NOTE — ED Notes (Signed)
Pt states body aches, nonproductive cough, congestion, headache. Vomited 2-3 times yesterday. Denies N/V/D today.

## 2011-11-24 NOTE — ED Provider Notes (Signed)
History     CSN: [id number] Arrival date & time: 11/24/2011  8:08 AM   First MD Initiated Contact with Patient 11/24/11 [phone]      Chief Complaint  Patient presents with  . Cough    (Consider location/radiation/quality/duration/timing/severity/associated sxs/prior treatment) Patient is a 25 y.o. female presenting with cough. The history is provided by the patient.  Cough This is a new problem. The current episode started yesterday. The problem occurs hourly. The problem has been gradually worsening. The cough is non-productive. Maximum temperature: suspected, but not measured. Associated symptoms include chills, sweats, headaches and myalgias. Pertinent negatives include no chest pain, no shortness of breath and no wheezing. She has tried nothing for the symptoms. The treatment provided no relief. She is a smoker. Her past medical history is significant for bronchitis.    Past Medical History  Diagnosis Date  . Asthma     History reviewed. No pertinent past surgical history.  No family history on file.  History  Substance Use Topics  . Smoking status: Current Everyday Smoker  . Smokeless tobacco: Not on file  . Alcohol Use: No    OB History    Grav Para Term Preterm Abortions TAB SAB Ect Mult Living                  Review of Systems  Constitutional: Positive for chills. Negative for activity change.       All ROS Neg except as noted in HPI  HENT: Negative for nosebleeds and neck pain.   Eyes: Negative for photophobia and discharge.  Respiratory: Positive for cough. Negative for shortness of breath and wheezing.   Cardiovascular: Negative for chest pain and palpitations.  Gastrointestinal: Negative for abdominal pain and blood in stool.  Genitourinary: Negative for dysuria, frequency and hematuria.  Musculoskeletal: Positive for myalgias. Negative for back pain and arthralgias.  Skin: Negative.   Neurological: Positive for headaches. Negative for dizziness,  seizures and speech difficulty.  Psychiatric/Behavioral: Negative for hallucinations and confusion.    Allergies  Review of patient's allergies indicates no known allergies.  Home Medications   Current Outpatient Rx  Name Route Sig Dispense Refill  . ALBUTEROL SULFATE HFA 108 (90 BASE) MCG/ACT IN AERS Inhalation Inhale 1-2 puffs into the lungs every 6 (six) hours as needed for wheezing. 1 Inhaler 0  . BUDESONIDE-FORMOTEROL FUMARATE 80-4.5 MCG/ACT IN AERO Inhalation Inhale 2 puffs into the lungs 2 (two) times daily. 1 Inhaler 0  . GUAIFENESIN 100 MG/5ML PO SOLN Oral Take 5 mLs by mouth every 4 (four) hours as needed. For cough    . HYDROCOD POLST-CHLORPHEN POLST 10-8 MG/5ML PO LQCR Oral Take 5 mLs by mouth every 12 (twelve) hours. 120 mL 0    BP 97/71  Pulse 104  Temp(Src) 98.7 F (37.1 C) (Oral)  Resp 18  Ht 5\' 7"  (1.702 m)  Wt 140 lb (63.504 kg)  BMI 21.93 kg/m2  SpO2 97%  LMP 11/24/2011  Physical Exam  Nursing note and vitals reviewed. Constitutional: She is oriented to person, place, and time. She appears well-developed and well-nourished.  Non-toxic appearance.  HENT:  Head: Normocephalic.  Right Ear: Tympanic membrane and external ear normal.  Left Ear: Tympanic membrane and external ear normal.       Congestion present.  Eyes: EOM and lids are normal. Pupils are equal, round, and reactive to light.  Neck: Normal range of motion. Neck supple. Carotid bruit is not present.  Cardiovascular: Normal rate, regular rhythm, normal heart  sounds, intact distal pulses and normal pulses.   Pulmonary/Chest: No respiratory distress. She has rhonchi.  Abdominal: Soft. Bowel sounds are normal. There is no tenderness. There is no guarding.  Musculoskeletal: Normal range of motion.  Lymphadenopathy:       Head (right side): No submandibular adenopathy present.       Head (left side): No submandibular adenopathy present.    She has no cervical adenopathy.  Neurological: She is alert  and oriented to person, place, and time. She has normal strength. No cranial nerve deficit or sensory deficit.  Skin: Skin is warm and dry.  Psychiatric: She has a normal mood and affect. Her speech is normal.    ED Course  Procedures (including critical care time)  Labs Reviewed - No data to display No results found.   1. Influenza       MDM  I have reviewed nursing notes, vital signs, and all appropriate lab and imaging results for this patient.        Kathie Dike, [place] 11/24/11 1723

## 2011-11-25 NOTE — ED Provider Notes (Signed)
Medical screening examination/treatment/procedure(s) were performed by non-physician practitioner and as supervising physician I was immediately available for consultation/collaboration.  Donnetta Hutching, MD 11/25/11 [phone]

## 2012-01-17 ENCOUNTER — Emergency Department (HOSPITAL_COMMUNITY): Payer: BC Managed Care – PPO

## 2012-01-17 ENCOUNTER — Encounter (HOSPITAL_COMMUNITY): Payer: Self-pay

## 2012-01-17 ENCOUNTER — Emergency Department (HOSPITAL_COMMUNITY)
Admission: EM | Admit: 2012-01-17 | Discharge: 2012-01-17 | Disposition: A | Discharge status: Home / Self Care | Payer: BC Managed Care – PPO | Attending: Emergency Medicine | Admitting: Emergency Medicine

## 2012-01-17 DIAGNOSIS — J45909 Unspecified asthma, uncomplicated: Secondary | ICD-10-CM

## 2012-01-17 DIAGNOSIS — R05 Cough: Secondary | ICD-10-CM | POA: Insufficient documentation

## 2012-01-17 DIAGNOSIS — R059 Cough, unspecified: Secondary | ICD-10-CM | POA: Insufficient documentation

## 2012-01-17 DIAGNOSIS — J3489 Other specified disorders of nose and nasal sinuses: Secondary | ICD-10-CM | POA: Insufficient documentation

## 2012-01-17 MED ORDER — PREDNISONE 20 MG PO TABS: 60.0000 mg | ORAL_TABLET | Freq: Every day | ORAL | Status: DC

## 2012-01-17 MED ORDER — BUDESONIDE-FORMOTEROL FUMARATE 80-4.5 MCG/ACT IN AERO: 2.0000 | INHALATION_SPRAY | Freq: Two times a day (BID) | RESPIRATORY_TRACT | Status: DC

## 2012-01-17 MED ORDER — ALBUTEROL SULFATE HFA 108 (90 BASE) MCG/ACT IN AERS
2.0000 | INHALATION_SPRAY | Freq: Once | RESPIRATORY_TRACT | Status: AC
  Administered 2012-01-17: 2 via RESPIRATORY_TRACT
  Filled 2012-01-17: qty 6.7

## 2012-01-17 MED ORDER — PREDNISONE 20 MG PO TABS
60.0000 mg | ORAL_TABLET | Freq: Once | ORAL | Status: AC
  Administered 2012-01-17: 60 mg via ORAL
  Filled 2012-01-17: qty 3

## 2012-01-17 MED ORDER — ALBUTEROL SULFATE (5 MG/ML) 0.5% IN NEBU
5.0000 mg | INHALATION_SOLUTION | Freq: Once | RESPIRATORY_TRACT | Status: AC
  Administered 2012-01-17: 5 mg via RESPIRATORY_TRACT
  Filled 2012-01-17: qty 1

## 2012-01-17 MED ORDER — ALBUTEROL SULFATE HFA 108 (90 BASE) MCG/ACT IN AERS: 1.0000 | INHALATION_SPRAY | Freq: Four times a day (QID) | RESPIRATORY_TRACT | Status: DC | PRN

## 2012-01-17 MED ORDER — IPRATROPIUM BROMIDE 0.02 % IN SOLN
0.5000 mg | Freq: Once | RESPIRATORY_TRACT | Status: AC
  Administered 2012-01-17: 0.5 mg via RESPIRATORY_TRACT
  Filled 2012-01-17: qty 2.5

## 2012-01-17 NOTE — ED Notes (Signed)
Pain across chest, pt thinks is due to frequent coughing.  Out of inhalers.

## 2012-01-17 NOTE — ED Notes (Signed)
Pt reports having "asthma attack this morning and where I coughed so much my chest hurts now" nad noted. Pt a/o x3. resp even/nonlabored. Skin warm/dry. Speaks complete sentences without difficulty. Pt also reports using last dose of medication for asthma this am.

## 2012-01-17 NOTE — ED Notes (Signed)
HHN in progress 

## 2012-01-19 NOTE — ED Provider Notes (Signed)
History     CSN: [id number]  Arrival date & time 01/17/12  1252   First MD Initiated Contact with Patient 01/17/12 1416      Chief Complaint  Patient presents with  . Nasal Congestion  . Asthma    (Consider location/radiation/quality/duration/timing/severity/associated sxs/prior treatment) Patient is a 26 y.o. female presenting with asthma. The history is provided by the patient.  Asthma This is a recurrent problem. The current episode started today. The problem has been unchanged. Associated symptoms include congestion and coughing. Pertinent negatives include no fever or sore throat. The symptoms are aggravated by nothing. Treatments tried: She has been using her home medications, but used up her albuterol this morning.  Also states has been out of  her steroid inhaler last week. The treatment provided no relief.    Past Medical History  Diagnosis Date  . Asthma     History reviewed. No pertinent past surgical history.  No family history on file.  History  Substance Use Topics  . Smoking status: Current Everyday Smoker  . Smokeless tobacco: Not on file  . Alcohol Use: No    OB History    Grav Para Term Preterm Abortions TAB SAB Ect Mult Living                  Review of Systems  Constitutional: Negative for fever.  HENT: Positive for congestion and rhinorrhea. Negative for sore throat.   Respiratory: Positive for cough and wheezing.     Allergies  Other  Home Medications   Current Outpatient Rx  Name Route Sig Dispense Refill  . ALBUTEROL SULFATE HFA 108 (90 BASE) MCG/ACT IN AERS Inhalation Inhale 1-2 puffs into the lungs every 6 (six) hours as needed for wheezing. 1 Inhaler 0  . BUDESONIDE-FORMOTEROL FUMARATE 80-4.5 MCG/ACT IN AERO Inhalation Inhale 2 puffs into the lungs 2 (two) times daily. 1 Inhaler 0  . ALBUTEROL SULFATE HFA 108 (90 BASE) MCG/ACT IN AERS Inhalation Inhale 1-2 puffs into the lungs every 6 (six) hours as needed for wheezing. 1 Inhaler 0    . BUDESONIDE-FORMOTEROL FUMARATE 80-4.5 MCG/ACT IN AERO Inhalation Inhale 2 puffs into the lungs 2 (two) times daily. 1 Inhaler 12  . PREDNISONE 20 MG PO TABS Oral Take 3 tablets (60 mg total) by mouth daily. 12 tablet 0    BP 104/66  Pulse 64  Temp(Src) 97.9 F (36.6 C) (Oral)  Resp 18  Ht 5\' 7"  (1.702 m)  Wt 135 lb (61.236 kg)  BMI 21.14 kg/m2  SpO2 98%  LMP 01/16/2012  Physical Exam  Nursing note and vitals reviewed. Constitutional: She is oriented to person, place, and time. She appears well-developed and well-nourished.  HENT:  Head: Normocephalic and atraumatic.  Eyes: Conjunctivae are normal.  Neck: Normal range of motion.  Cardiovascular: Normal rate, regular rhythm, normal heart sounds and intact distal pulses.   Pulmonary/Chest: Effort normal. She has wheezes. She exhibits no tenderness.  Abdominal: Soft. Bowel sounds are normal. There is no tenderness.  Musculoskeletal: Normal range of motion.  Neurological: She is alert and oriented to person, place, and time.  Skin: Skin is warm and dry.  Psychiatric: She has a normal mood and affect.    ED Course  Procedures (including critical care time)  Labs Reviewed - No data to display No results found.   1. ASTHMA     Albuterol 5mg  /atrovent 0.5 mg neb given with resolution of symptoms.  MDM  Patient prescribed albuterol and symbicort (refill).  Also prescribed prednisone,  Advised to start taking symbicort as prescribed once steroid pulse dose completed.        Candis Musa, PA 01/19/12 1514

## 2012-01-21 NOTE — ED Provider Notes (Signed)
Medical screening examination/treatment/procedure(s) were performed by non-physician practitioner and as supervising physician I was immediately available for consultation/collaboration.   Celene Kras, MD 01/21/12 1501

## 2012-03-06 ENCOUNTER — Encounter (HOSPITAL_COMMUNITY): Payer: Self-pay | Admitting: *Deleted

## 2012-03-06 ENCOUNTER — Emergency Department (HOSPITAL_COMMUNITY)
Admission: EM | Admit: 2012-03-06 | Discharge: 2012-03-06 | Disposition: A | Discharge status: Home / Self Care | Payer: BC Managed Care – PPO | Attending: Emergency Medicine | Admitting: Emergency Medicine

## 2012-03-06 DIAGNOSIS — M545 Low back pain, unspecified: Secondary | ICD-10-CM

## 2012-03-06 DIAGNOSIS — Z87891 Personal history of nicotine dependence: Secondary | ICD-10-CM | POA: Insufficient documentation

## 2012-03-06 DIAGNOSIS — M4120 Other idiopathic scoliosis, site unspecified: Secondary | ICD-10-CM | POA: Insufficient documentation

## 2012-03-06 DIAGNOSIS — M419 Scoliosis, unspecified: Secondary | ICD-10-CM

## 2012-03-06 DIAGNOSIS — J45909 Unspecified asthma, uncomplicated: Secondary | ICD-10-CM | POA: Insufficient documentation

## 2012-03-06 MED ORDER — PREDNISONE 20 MG PO TABS
60.0000 mg | ORAL_TABLET | Freq: Once | ORAL | Status: AC
  Administered 2012-03-06: 60 mg via ORAL
  Filled 2012-03-06: qty 3

## 2012-03-06 MED ORDER — PREDNISONE 10 MG PO TABS: 20.0000 mg | ORAL_TABLET | Freq: Every day | ORAL | Status: DC

## 2012-03-06 MED ORDER — HYDROCODONE-ACETAMINOPHEN 5-325 MG PO TABS
2.0000 | ORAL_TABLET | Freq: Once | ORAL | Status: AC
  Administered 2012-03-06: 2 via ORAL
  Filled 2012-03-06: qty 2

## 2012-03-06 MED ORDER — HYDROCODONE-ACETAMINOPHEN 5-325 MG PO TABS: ORAL_TABLET | ORAL | Status: DC

## 2012-03-06 MED ORDER — METHOCARBAMOL 500 MG PO TABS: 500.0000 mg | ORAL_TABLET | Freq: Three times a day (TID) | ORAL | Status: AC

## 2012-03-06 MED ORDER — ONDANSETRON HCL 4 MG PO TABS
4.0000 mg | ORAL_TABLET | Freq: Once | ORAL | Status: AC
  Administered 2012-03-06: 4 mg via ORAL
  Filled 2012-03-06: qty 1

## 2012-03-06 MED ORDER — DIAZEPAM 5 MG PO TABS
5.0000 mg | ORAL_TABLET | Freq: Once | ORAL | Status: AC
  Administered 2012-03-06: 5 mg via ORAL
  Filled 2012-03-06: qty 1

## 2012-03-06 NOTE — ED Notes (Signed)
Low back pain with radiation down lt leg for 2-3 days, No known injury.

## 2012-03-06 NOTE — Discharge Instructions (Signed)
I do not find acute or emergent changes involving your lower back on this emergency room visit. Please rest her back, please use a heating pad when possible, and please take medications as suggested. Norco and Robaxin may cause drowsiness, please use with caution. Please see your primary physician as soon as possible for additional evaluation and possible referral concerning your back.Back Pain, Adult Low back pain is very common. About 1 in 5 people have back pain.The cause of low back pain is rarely dangerous. The pain often gets better over time.About half of people with a sudden onset of back pain feel better in just 2 weeks. About 8 in 10 people feel better by 6 weeks.  CAUSES Some common causes of back pain include:  Strain of the muscles or ligaments supporting the spine.   Wear and tear (degeneration) of the spinal discs.   Arthritis.   Direct injury to the back.  DIAGNOSIS Most of the time, the direct cause of low back pain is not known.However, back pain can be treated effectively even when the exact cause of the pain is unknown.Answering your caregiver's questions about your overall health and symptoms is one of the most accurate ways to make sure the cause of your pain is not dangerous. If your caregiver needs more information, he or she may order lab work or imaging tests (X-rays or MRIs).However, even if imaging tests show changes in your back, this usually does not require surgery. HOME CARE INSTRUCTIONS For many people, back pain returns.Since low back pain is rarely dangerous, it is often a condition that people can learn to [facility] their own.   Remain active. It is stressful on the back to sit or stand in one place. Do not sit, drive, or stand in one place for more than 30 minutes at a time. Take short walks on level surfaces as soon as pain allows.Try to increase the length of time you walk each day.   Do not stay in bed.Resting more than 1 or 2 days can delay your  recovery.   Do not avoid exercise or work.Your body is made to move.It is not dangerous to be active, even though your back may hurt.Your back will likely heal faster if you return to being active before your pain is gone.   Pay attention to your body when you bend and lift. Many people have less discomfortwhen lifting if they bend their knees, keep the load close to their bodies,and avoid twisting. Often, the most comfortable positions are those that put less stress on your recovering back.   Find a comfortable position to sleep. Use a firm mattress and lie on your side with your knees slightly bent. If you lie on your back, put a pillow under your knees.   Only take over-the-counter or prescription medicines as directed by your caregiver. Over-the-counter medicines to reduce pain and inflammation are often the most helpful.Your caregiver may prescribe muscle relaxant drugs.These medicines help dull your pain so you can more quickly return to your normal activities and healthy exercise.   Put ice on the injured area.   Put ice in a plastic bag.   Place a towel between your skin and the bag.   Leave the ice on for 15 to 20 minutes, 3 to 4 times a day for the first 2 to 3 days. After that, ice and heat may be alternated to reduce pain and spasms.   Ask your caregiver about trying back exercises and gentle massage. This  may be of some benefit.   Avoid feeling anxious or stressed.Stress increases muscle tension and can worsen back pain.It is important to recognize when you are anxious or stressed and learn ways to manage it.Exercise is a great option.  SEEK MEDICAL CARE IF:  You have pain that is not relieved with rest or medicine.   You have pain that does not improve in 1 week.   You have new symptoms.   You are generally not feeling well.  SEEK IMMEDIATE MEDICAL CARE IF:   You have pain that radiates from your back into your legs.   You develop new bowel or bladder  control problems.   You have unusual weakness or numbness in your arms or legs.   You develop nausea or vomiting.   You develop abdominal pain.   You feel faint.  Document Released: 11/22/2005 Document Revised: 11/11/2011 Document Reviewed: 04/12/2011 [facility] Patient Information 2012 [place], [place].

## 2012-03-06 NOTE — ED Notes (Signed)
Pt presents with lower back pain that radiates to left leg x 3 days. Pt denies injury to back.

## 2012-03-08 ENCOUNTER — Encounter (HOSPITAL_COMMUNITY): Payer: Self-pay | Admitting: *Deleted

## 2012-03-08 ENCOUNTER — Emergency Department (HOSPITAL_COMMUNITY)
Admission: EM | Admit: 2012-03-08 | Discharge: 2012-03-08 | Disposition: A | Discharge status: Home / Self Care | Payer: BC Managed Care – PPO | Attending: Emergency Medicine | Admitting: Emergency Medicine

## 2012-03-08 DIAGNOSIS — G8929 Other chronic pain: Secondary | ICD-10-CM | POA: Insufficient documentation

## 2012-03-08 DIAGNOSIS — M5416 Radiculopathy, lumbar region: Secondary | ICD-10-CM

## 2012-03-08 DIAGNOSIS — Z87891 Personal history of nicotine dependence: Secondary | ICD-10-CM | POA: Insufficient documentation

## 2012-03-08 DIAGNOSIS — M4120 Other idiopathic scoliosis, site unspecified: Secondary | ICD-10-CM | POA: Insufficient documentation

## 2012-03-08 DIAGNOSIS — J45909 Unspecified asthma, uncomplicated: Secondary | ICD-10-CM | POA: Insufficient documentation

## 2012-03-08 DIAGNOSIS — M545 Low back pain, unspecified: Secondary | ICD-10-CM

## 2012-03-08 HISTORY — DX: Scoliosis, unspecified: M41.9

## 2012-03-08 MED ORDER — PREDNISONE 50 MG PO TABS: 50.0000 mg | ORAL_TABLET | Freq: Every day | ORAL | Status: DC

## 2012-03-08 MED ORDER — HYDROCODONE-ACETAMINOPHEN 5-325 MG PO TABS: 1.0000 | ORAL_TABLET | Freq: Four times a day (QID) | ORAL | Status: AC | PRN

## 2012-03-08 NOTE — ED Notes (Signed)
Pain low back , seen here 4/1.  Numbness in left leg "getting worse"

## 2012-03-08 NOTE — ED Provider Notes (Signed)
History     CSN: [id number]  Arrival date & time 03/08/12  1151   First MD Initiated Contact with Patient 03/08/12 1205      Chief Complaint  Patient presents with  . Back Pain    (Consider location/radiation/quality/duration/timing/severity/associated sxs/prior treatment) HPI Comments: Here 2 days ago with same complaint.  She has known scoliosis and chronic low back pain.  She has developed L leg radicular symptoms in past week or so.  No known injury.  Out of pain med.  Taking prednisone 20 mg daily without improvement.  States she contacted dr. Tressie Stalker office who told her they would see her if she had an ED referral.  She also wants a note stating she can return to work tomorrow.  If she doesn't return to work in 2 days she was told they would fire her.  Patient is a 26 y.o. female presenting with back pain. The history is provided by the patient.  Back Pain  This is a chronic problem. The problem occurs constantly. The problem has not changed since onset.The pain is associated with no known injury. The pain is present in the lumbar spine. The pain is severe. The symptoms are aggravated by bending and twisting. The pain is the same all the time. Associated symptoms include numbness. She has tried analgesics and muscle relaxants for the symptoms.    Past Medical History  Diagnosis Date  . Asthma   . Scoliosis     History reviewed. No pertinent past surgical history.  History reviewed. No pertinent family history.  History  Substance Use Topics  . Smoking status: Former Smoker    Quit date: 02/14/2012  . Smokeless tobacco: Not on file  . Alcohol Use: No    OB History    Grav Para Term Preterm Abortions TAB SAB Ect Mult Living                  Review of Systems  Musculoskeletal: Positive for back pain.  Neurological: Positive for numbness.  All other systems reviewed and are negative.    Allergies  Other  Home Medications   Current Outpatient Rx    Name Route Sig Dispense Refill  . ALBUTEROL SULFATE HFA 108 (90 BASE) MCG/ACT IN AERS Inhalation Inhale 1-2 puffs into the lungs every 6 (six) hours as needed for wheezing. 1 Inhaler 0  . BUDESONIDE-FORMOTEROL FUMARATE 80-4.5 MCG/ACT IN AERO Inhalation Inhale 2 puffs into the lungs 2 (two) times daily. 1 Inhaler 0  . HYDROCODONE-ACETAMINOPHEN 5-325 MG PO TABS  1 or 2 po q4h prn pain 20 tablet 0  . METHOCARBAMOL 500 MG PO TABS Oral Take 1 tablet (500 mg total) by mouth 3 (three) times daily. 21 tablet 0  . PREDNISONE 10 MG PO TABS Oral Take 2 tablets (20 mg total) by mouth daily. Take with food 12 tablet 0    BP 96/59  Pulse 96  Temp(Src) 99.2 F (37.3 C) (Oral)  Resp 18  Ht 5\' 7"  (1.702 m)  Wt 140 lb (63.504 kg)  BMI 21.93 kg/m2  SpO2 99%  LMP 02/17/2012  Physical Exam  Nursing note and vitals reviewed. Constitutional: She is oriented to person, place, and time. She appears well-developed and well-nourished. No distress.  HENT:  Head: Normocephalic and atraumatic.  Eyes: EOM are normal.  Neck: Normal range of motion.  Cardiovascular: Normal rate, regular rhythm and normal heart sounds.   Pulmonary/Chest: Effort normal and breath sounds normal.  Abdominal: Soft. She exhibits no  distension. There is no tenderness.  Musculoskeletal:       Lumbar back: She exhibits decreased range of motion and pain. She exhibits no tenderness, no bony tenderness and normal pulse.       Back:  Neurological: She is alert and oriented to person, place, and time. She has normal strength. She displays normal reflexes. No sensory deficit. Coordination normal. GCS eye subscore is 4. GCS verbal subscore is 5. GCS motor subscore is 6.  Reflex Scores:      Patellar reflexes are 2+ on the right side and 2+ on the left side.      Achilles reflexes are 2+ on the right side and 2+ on the left side. Skin: Skin is warm and dry.  Psychiatric: She has a normal mood and affect. Judgment normal.    ED Course   Procedures (including critical care time)  Labs Reviewed - No data to display No results found.   No diagnosis found.    MDM  rx prednisone 50 mg, 6 rx-hydrocodone, 20 Ice F/u with dr. Laurey Morale, PA 03/08/12 [phone]

## 2012-03-08 NOTE — ED Provider Notes (Signed)
Medical screening examination/treatment/procedure(s) were performed by non-physician practitioner and as supervising physician I was immediately available for consultation/collaboration.  Paulena Servais M Eliel Dudding, MD 03/08/12 2156 

## 2012-03-08 NOTE — Discharge Instructions (Signed)
Back Pain, Adult Low back pain is very common. About 1 in 5 people have back pain.The cause of low back pain is rarely dangerous. The pain often gets better over time.About half of people with a sudden onset of back pain feel better in just 2 weeks. About 8 in 10 people feel better by 6 weeks.  CAUSES Some common causes of back pain include:  Strain of the muscles or ligaments supporting the spine.   Wear and tear (degeneration) of the spinal discs.   Arthritis.   Direct injury to the back.  DIAGNOSIS Most of the time, the direct cause of low back pain is not known.However, back pain can be treated effectively even when the exact cause of the pain is unknown.Answering your caregiver's questions about your overall health and symptoms is one of the most accurate ways to make sure the cause of your pain is not dangerous. If your caregiver needs more information, he or she may order lab work or imaging tests (X-rays or MRIs).However, even if imaging tests show changes in your back, this usually does not require surgery. HOME CARE INSTRUCTIONS For many people, back pain returns.Since low back pain is rarely dangerous, it is often a condition that people can learn to manageon their own.   Remain active. It is stressful on the back to sit or stand in one place. Do not sit, drive, or stand in one place for more than 30 minutes at a time. Take short walks on level surfaces as soon as pain allows.Try to increase the length of time you walk each day.   Do not stay in bed.Resting more than 1 or 2 days can delay your recovery.   Do not avoid exercise or work.Your body is made to move.It is not dangerous to be active, even though your back may hurt.Your back will likely heal faster if you return to being active before your pain is gone.   Pay attention to your body when you bend and lift. Many people have less discomfortwhen lifting if they bend their knees, keep the load close to their  bodies,and avoid twisting. Often, the most comfortable positions are those that put less stress on your recovering back.   Find a comfortable position to sleep. Use a firm mattress and lie on your side with your knees slightly bent. If you lie on your back, put a pillow under your knees.   Only take over-the-counter or prescription medicines as directed by your caregiver. Over-the-counter medicines to reduce pain and inflammation are often the most helpful.Your caregiver may prescribe muscle relaxant drugs.These medicines help dull your pain so you can more quickly return to your normal activities and healthy exercise.   Put ice on the injured area.   Put ice in a plastic bag.   Place a towel between your skin and the bag.   Leave the ice on for 15 to 20 minutes, 3 to 4 times a day for the first 2 to 3 days. After that, ice and heat may be alternated to reduce pain and spasms.   Ask your caregiver about trying back exercises and gentle massage. This may be of some benefit.   Avoid feeling anxious or stressed.Stress increases muscle tension and can worsen back pain.It is important to recognize when you are anxious or stressed and learn ways to manage it.Exercise is a great option.  SEEK MEDICAL CARE IF:  You have pain that is not relieved with rest or medicine.   You have   pain that does not improve in 1 week.   You have new symptoms.   You are generally not feeling well.  SEEK IMMEDIATE MEDICAL CARE IF:   You have pain that radiates from your back into your legs.   You develop new bowel or bladder control problems.   You have unusual weakness or numbness in your arms or legs.   You develop nausea or vomiting.   You develop abdominal pain.   You feel faint.  Document Released: 11/22/2005 Document Revised: 11/11/2011 Document Reviewed: 04/12/2011 [facility] Patient Information 2012 [place], [place].   Take the pain med as directed.  i have written for a higher dose of  prednisone.  Apply ice several times daily to lower back.  Call dr. Lovell Sheehan for an appt.  Either he or a primary care MD will need to prescribe any further narcotic pain meds you might need.

## 2012-03-22 NOTE — ED Provider Notes (Signed)
Medical screening examination/treatment/procedure(s) were performed by non-physician practitioner and as supervising physician I was immediately available for consultation/collaboration.   Benny Lennert, MD 03/22/12 [phone]

## 2012-03-22 NOTE — ED Provider Notes (Signed)
History     CSN: [id number]  Arrival date & time 03/06/12  1641   First MD Initiated Contact with Patient 03/06/12 1855      Chief Complaint  Patient presents with  . Back Pain    (Consider location/radiation/quality/duration/timing/severity/associated sxs/prior treatment) Patient is a 26 y.o. female presenting with back pain. The history is provided by the patient.  Back Pain  This is a recurrent problem. The current episode started more than 2 days ago. The problem occurs daily. The problem has been gradually worsening. Associated with: Scolliosis and chronic back problems. The pain is present in the thoracic spine and lumbar spine. The quality of the pain is described as aching (sharpe). The pain is severe. The symptoms are aggravated by certain positions. The pain is the same all the time. Stiffness is present all day. Associated symptoms include numbness and tingling. Pertinent negatives include no chest pain, no fever, no abdominal pain, no bowel incontinence, no perianal numbness, no bladder incontinence and no dysuria. She has tried analgesics and bed rest for the symptoms. The treatment provided no relief.    Past Medical History  Diagnosis Date  . Asthma   . Scoliosis     History reviewed. No pertinent past surgical history.  History reviewed. No pertinent family history.  History  Substance Use Topics  . Smoking status: Former Smoker    Quit date: 02/14/2012  . Smokeless tobacco: Not on file  . Alcohol Use: No    OB History    Grav Para Term Preterm Abortions TAB SAB Ect Mult Living                  Review of Systems  Constitutional: Negative for fever and activity change.       All ROS Neg except as noted in HPI  HENT: Negative for nosebleeds and neck pain.   Eyes: Negative for photophobia and discharge.  Respiratory: Negative for cough, shortness of breath and wheezing.   Cardiovascular: Negative for chest pain and palpitations.  Gastrointestinal:  Negative for abdominal pain, blood in stool and bowel incontinence.  Genitourinary: Negative for bladder incontinence, dysuria, frequency and hematuria.  Musculoskeletal: Positive for back pain. Negative for arthralgias.  Skin: Negative.   Neurological: Positive for tingling and numbness. Negative for dizziness, seizures and speech difficulty.  Psychiatric/Behavioral: Negative for hallucinations and confusion.    Allergies  Other  Home Medications   Current Outpatient Rx  Name Route Sig Dispense Refill  . ALBUTEROL SULFATE HFA 108 (90 BASE) MCG/ACT IN AERS Inhalation Inhale 1-2 puffs into the lungs every 6 (six) hours as needed for wheezing. 1 Inhaler 0  . BUDESONIDE-FORMOTEROL FUMARATE 80-4.5 MCG/ACT IN AERO Inhalation Inhale 2 puffs into the lungs 2 (two) times daily. 1 Inhaler 0  . HYDROCODONE-ACETAMINOPHEN 5-325 MG PO TABS  1 or 2 po q4h prn pain 20 tablet 0  . PREDNISONE 10 MG PO TABS Oral Take 2 tablets (20 mg total) by mouth daily. Take with food 12 tablet 0  . PREDNISONE 50 MG PO TABS Oral Take 1 tablet (50 mg total) by mouth daily. 6 tablet 0    BP 103/66  Pulse 92  Temp(Src) 97.8 F (36.6 C) (Oral)  Resp 16  Ht 5\' 7"  (1.702 m)  Wt 140 lb (63.504 kg)  BMI 21.93 kg/m2  SpO2 100%  LMP 02/17/2012  Physical Exam  Nursing note and vitals reviewed. Constitutional: She is oriented to person, place, and time. She appears well-developed and well-nourished.  Non-toxic appearance.  HENT:  Head: Normocephalic.  Right Ear: Tympanic membrane and external ear normal.  Left Ear: Tympanic membrane and external ear normal.  Eyes: EOM and lids are normal. Pupils are equal, round, and reactive to light.  Neck: Normal range of motion. Neck supple. Carotid bruit is not present.  Cardiovascular: Normal rate, regular rhythm, normal heart sounds, intact distal pulses and normal pulses.   Pulmonary/Chest: Breath sounds normal. No respiratory distress.  Abdominal: Soft. Bowel sounds are  normal. There is no tenderness. There is no guarding.  Musculoskeletal: Normal range of motion.       Lower thoracic and lumbar area pain to palpation. Decrease attempted ROM due to pain. The area is not Hot.  Lymphadenopathy:       Head (right side): No submandibular adenopathy present.       Head (left side): No submandibular adenopathy present.    She has no cervical adenopathy.  Neurological: She is alert and oriented to person, place, and time. She has normal strength. No cranial nerve deficit or sensory deficit. She exhibits normal muscle tone. Coordination normal. GCS eye subscore is 4. GCS verbal subscore is 5. GCS motor subscore is 6.  Skin: Skin is warm and dry.  Psychiatric: Her speech is normal. Her mood appears anxious.    ED Course  Procedures (including critical care time)  Labs Reviewed - No data to display No results found.   1. Low back pain   2. Scoliosis       MDM  I have reviewed nursing notes, vital signs, and all appropriate lab and imaging results for this patient. Pt has hx of scoliosis and back problems. She is working now and stand is aggravating the lower back. No gross neuro changes noted on exam. Pt to see her PCP or neurosurgeon for followup and recheck. Rx for prednisone and Norco given to the patient.       Kathie Dike, [place] 03/22/12 1531

## 2012-03-29 ENCOUNTER — Emergency Department (HOSPITAL_COMMUNITY)
Admission: EM | Admit: 2012-03-29 | Discharge: 2012-03-29 | Disposition: A | Discharge status: Home / Self Care | Payer: Worker's Compensation | Attending: Emergency Medicine | Admitting: Emergency Medicine

## 2012-03-29 ENCOUNTER — Encounter (HOSPITAL_COMMUNITY): Payer: Self-pay | Admitting: Emergency Medicine

## 2012-03-29 ENCOUNTER — Emergency Department (HOSPITAL_COMMUNITY): Payer: Worker's Compensation

## 2012-03-29 DIAGNOSIS — M5416 Radiculopathy, lumbar region: Secondary | ICD-10-CM

## 2012-03-29 DIAGNOSIS — IMO0002 Reserved for concepts with insufficient information to code with codable children: Secondary | ICD-10-CM | POA: Insufficient documentation

## 2012-03-29 DIAGNOSIS — R209 Unspecified disturbances of skin sensation: Secondary | ICD-10-CM | POA: Insufficient documentation

## 2012-03-29 DIAGNOSIS — J45909 Unspecified asthma, uncomplicated: Secondary | ICD-10-CM | POA: Insufficient documentation

## 2012-03-29 DIAGNOSIS — Z79899 Other long term (current) drug therapy: Secondary | ICD-10-CM | POA: Insufficient documentation

## 2012-03-29 LAB — URINALYSIS, ROUTINE W REFLEX MICROSCOPIC
Ketones, ur: NEGATIVE mg/dL
Leukocytes, UA: NEGATIVE
Nitrite: NEGATIVE
Protein, ur: NEGATIVE mg/dL
Urobilinogen, UA: 0.2 mg/dL (ref 0.0–1.0)
pH: 7 (ref 5.0–8.0)

## 2012-03-29 MED ORDER — OXYCODONE-ACETAMINOPHEN 5-325 MG PO TABS: 1.0000 | ORAL_TABLET | ORAL | Status: AC | PRN

## 2012-03-29 MED ORDER — HYDROCODONE-ACETAMINOPHEN 5-325 MG PO TABS
1.0000 | ORAL_TABLET | Freq: Once | ORAL | Status: AC
  Administered 2012-03-29: 1 via ORAL
  Filled 2012-03-29: qty 1

## 2012-03-29 MED ORDER — IBUPROFEN 800 MG PO TABS
800.0000 mg | ORAL_TABLET | Freq: Once | ORAL | Status: AC
  Administered 2012-03-29: 800 mg via ORAL
  Filled 2012-03-29: qty 1

## 2012-03-29 NOTE — ED Provider Notes (Signed)
History     CSN: [id number]  Arrival date & time 03/29/12  1932   First MD Initiated Contact with Patient 03/29/12 1956      Chief Complaint  Patient presents with  . Back Pain    (Consider location/radiation/quality/duration/timing/severity/associated sxs/prior treatment) HPI Comments: Olivia Fuller presents for treatment of acute on chronic low back pain.  She is currently under the care of Dr. Lovell Sheehan, neurosurgeon who has scheduled her for physical therapy for treatment of her chronic low back pain which is secondary to scoliosis.  He is also in the process of scheduling an MRI for this patient.  At work this morning she was throwing trash out when she felt a pop in her lower back and now has increased pain at this location.  The pain is sharp and constant and worse with certain movements such as bending and with walking.  She does have radiation into her left posterior upper thigh, but states this radiation of pain is chronic.  She also has numbness in her left lateral thigh which is also chronic.  She denies fevers and chills, no chest pain or abdominal pain, no urinary or bowel retention or incontinence.  No perianal numbness.  She is currently taking hydrocodone which does not relieve her pain.  Patient is a 26 y.o. female presenting with back pain. The history is provided by the patient.  Back Pain  Associated symptoms include numbness. Pertinent negatives include no chest pain, no fever, no abdominal pain, no dysuria and no weakness.    Past Medical History  Diagnosis Date  . Asthma   . Scoliosis     History reviewed. No pertinent past surgical history.  No family history on file.  History  Substance Use Topics  . Smoking status: Former Smoker    Quit date: 02/14/2012  . Smokeless tobacco: Not on file  . Alcohol Use: No    OB History    Grav Para Term Preterm Abortions TAB SAB Ect Mult Living                  Review of Systems  Constitutional: Negative for  fever.  Respiratory: Negative for shortness of breath.   Cardiovascular: Negative for chest pain and leg swelling.  Gastrointestinal: Negative for abdominal pain, constipation and abdominal distention.  Genitourinary: Negative for dysuria, urgency, frequency, flank pain and difficulty urinating.  Musculoskeletal: Positive for back pain. Negative for joint swelling and gait problem.  Skin: Negative for rash.  Neurological: Positive for numbness. Negative for weakness.    Allergies  Other  Home Medications   Current Outpatient Rx  Name Route Sig Dispense Refill  . ALBUTEROL SULFATE HFA 108 (90 BASE) MCG/ACT IN AERS Inhalation Inhale 1-2 puffs into the lungs every 6 (six) hours as needed for wheezing. 1 Inhaler 0  . BUDESONIDE-FORMOTEROL FUMARATE 80-4.5 MCG/ACT IN AERO Inhalation Inhale 2 puffs into the lungs 2 (two) times daily. 1 Inhaler 0  . CLOMIPHENE CITRATE 50 MG PO TABS Oral Take 50 mg by mouth See admin instructions. Take one tablet by mouth each day on days 3 through 7 of menstrual cycle.    . ETODOLAC 400 MG PO TABS Oral Take 400 mg by mouth 2 (two) times daily with a meal.    . HYDROCODONE-ACETAMINOPHEN 10-325 MG PO TABS Oral Take 1 tablet by mouth every 4 (four) hours as needed. For pain    . METHOCARBAMOL 500 MG PO TABS Oral Take 1,000 mg by mouth 3 (three) times  daily as needed. For pain    . ONE-A-DAY WOMENS FORMULA PO Oral Take 1 tablet by mouth daily.    . OXYCODONE-ACETAMINOPHEN 5-325 MG PO TABS Oral Take 1 tablet by mouth every 4 (four) hours as needed for pain. 20 tablet 0  . PREDNISONE 10 MG PO TABS Oral Take 2 tablets (20 mg total) by mouth daily. Take with food 12 tablet 0  . PREDNISONE 50 MG PO TABS Oral Take 1 tablet (50 mg total) by mouth daily. 6 tablet 0    BP 106/61  Pulse 86  Temp(Src) 98.2 F (36.8 C) (Oral)  Resp 16  Ht 5\' 7"  (1.702 m)  Wt 135 lb (61.236 kg)  BMI 21.14 kg/m2  SpO2 100%  LMP 03/24/2012  Physical Exam  Nursing note and vitals  reviewed. Constitutional: She appears well-developed and well-nourished.  HENT:  Head: Normocephalic.  Eyes: Conjunctivae are normal.  Neck: Normal range of motion. Neck supple.  Cardiovascular: Normal rate and intact distal pulses.        Pedal pulses normal.  Pulmonary/Chest: Effort normal.  Abdominal: Soft. Bowel sounds are normal. She exhibits no distension and no mass.  Musculoskeletal: Normal range of motion. She exhibits tenderness. She exhibits no edema.       Lumbar back: She exhibits tenderness. She exhibits no swelling, no edema and no spasm.       Patient is tender to palpation over the mid lower lumbar a left paralumbar.  Neurological: She is alert. She has normal strength. She displays no atrophy and no tremor. No sensory deficit. Gait normal.  Reflex Scores:      Patellar reflexes are 2+ on the right side and 2+ on the left side.      Achilles reflexes are 2+ on the right side and 2+ on the left side.      No strength deficit noted in hip and knee flexor and extensor muscle groups.  Ankle flexion and extension intact.  Skin: Skin is warm and dry.  Psychiatric: She has a normal mood and affect.    ED Course  Procedures (including critical care time)   Labs Reviewed  URINALYSIS, ROUTINE W REFLEX MICROSCOPIC  POCT PREGNANCY, URINE   Dg Lumbar Spine Complete  03/29/2012  *RADIOLOGY REPORT*  Clinical Data: Scoliosis.  Low back pain.  LUMBAR SPINE - COMPLETE 4+ VIEW  Comparison: None.  Findings: Leftward curvature of the lumbar spine is centered at L3. Five non-rib bearing lumbar type vertebral bodies are present.  The vertebral body heights and alignment are maintained.  Limited imaging of the abdomen is unremarkable.  IMPRESSION:  1.  Leftward curvature of lumbar spine. 2.  No other acute or focal abnormality.  Original Report Authenticated By: Jamesetta Orleans. MATTERN, M.D.     1. Lumbar radiculopathy       MDM    Percocet prescribed in place of hydrocodone.   Patient instructed to continue her Robaxin which she is already taking.  Heat therapy apply to lower back.  Follow up with Dr. Lovell Sheehan as his instructions.  No neuro deficit on exam or by history to suggest emergent or surgical presentation.         Candis Musa, PA 03/29/12 2131

## 2012-03-29 NOTE — ED Notes (Signed)
Low lt side back pain with radiation down lt leg. Says she was at work and throwing something into dumpster and felt pain in back.  Injured this am.

## 2012-03-29 NOTE — Discharge Instructions (Signed)
Lumbosacral Radiculopathy Lumbosacral radiculopathy is a pinched nerve or nerves in the low back (lumbosacral area). When this happens you may have weakness in your legs and may not be able to stand on your toes. You may have pain going down into your legs. There may be difficulties with walking normally. There are many causes of this problem. Sometimes this may happen from an injury, or simply from arthritis or boney problems. It may also be caused by other illnesses such as diabetes. If there is no improvement after treatment, further studies may be done to find the exact cause. DIAGNOSIS  X-rays may be needed if the problems become long standing. Electromyograms may be done. This study is one in which the working of nerves and muscles is studied. HOME CARE INSTRUCTIONS   Applications of ice packs may be helpful. Ice can be used in a plastic bag with a towel around it to prevent frostbite to skin. This may be used every 2 hours for 20 to 30 minutes, or as needed, while awake, or as directed by your caregiver.   Only take over-the-counter or prescription medicines for pain, discomfort, or fever as directed by your caregiver.   If physical therapy was prescribed, follow your caregiver's directions.  SEEK IMMEDIATE MEDICAL CARE IF:   You have pain not controlled with medications.   You seem to be getting worse rather than better.   You develop increasing weakness in your legs.   You develop loss of bowel or bladder control.   You have difficulty with walking or balance, or develop clumsiness in the use of your legs.   You have a fever.  MAKE SURE YOU:   Understand these instructions.   Will watch your condition.   Will get help right away if you are not doing well or get worse.  Document Released: 11/22/2005 Document Revised: 11/11/2011 Document Reviewed: 07/12/2008 [facility] Patient Information 2012 [place], [place].   You may try the Percocet in place of hydrocodone which may give  better pain relief.  Follow up with Dr. Lovell Sheehan as already planned for further management of your back pain.

## 2012-03-29 NOTE — ED Notes (Signed)
Pt injured herself this am at working throwing trash out.

## 2012-03-30 NOTE — ED Provider Notes (Signed)
Medical screening examination/treatment/procedure(s) were performed by non-physician practitioner and as supervising physician I was immediately available for consultation/collaboration.   Glynn Octave, MD 03/30/12 [phone]

## 2012-04-16 ENCOUNTER — Encounter (HOSPITAL_COMMUNITY): Payer: Self-pay | Admitting: *Deleted

## 2012-04-16 ENCOUNTER — Emergency Department (HOSPITAL_COMMUNITY)
Admission: EM | Admit: 2012-04-16 | Discharge: 2012-04-16 | Disposition: A | Discharge status: Home / Self Care | Payer: BC Managed Care – PPO | Attending: Emergency Medicine | Admitting: Emergency Medicine

## 2012-04-16 DIAGNOSIS — M549 Dorsalgia, unspecified: Secondary | ICD-10-CM

## 2012-04-16 DIAGNOSIS — Z79899 Other long term (current) drug therapy: Secondary | ICD-10-CM | POA: Insufficient documentation

## 2012-04-16 DIAGNOSIS — J45909 Unspecified asthma, uncomplicated: Secondary | ICD-10-CM | POA: Insufficient documentation

## 2012-04-16 MED ORDER — ONDANSETRON HCL 4 MG PO TABS
4.0000 mg | ORAL_TABLET | Freq: Once | ORAL | Status: AC
  Administered 2012-04-16: 4 mg via ORAL
  Filled 2012-04-16: qty 1

## 2012-04-16 MED ORDER — DIAZEPAM 5 MG PO TABS
5.0000 mg | ORAL_TABLET | Freq: Once | ORAL | Status: AC
  Administered 2012-04-16: 5 mg via ORAL
  Filled 2012-04-16: qty 1

## 2012-04-16 MED ORDER — KETOROLAC TROMETHAMINE 10 MG PO TABS
10.0000 mg | ORAL_TABLET | Freq: Once | ORAL | Status: AC
  Administered 2012-04-16: 10 mg via ORAL
  Filled 2012-04-16: qty 1

## 2012-04-16 MED ORDER — HYDROCODONE-ACETAMINOPHEN 5-325 MG PO TABS: 1.0000 | ORAL_TABLET | ORAL | Status: AC | PRN

## 2012-04-16 MED ORDER — METHOCARBAMOL 500 MG PO TABS: ORAL_TABLET | ORAL | Status: DC

## 2012-04-16 MED ORDER — MELOXICAM 7.5 MG PO TABS: ORAL_TABLET | ORAL | Status: DC

## 2012-04-16 MED ORDER — HYDROCODONE-ACETAMINOPHEN 5-325 MG PO TABS
2.0000 | ORAL_TABLET | Freq: Once | ORAL | Status: AC
  Administered 2012-04-16: 2 via ORAL
  Filled 2012-04-16: qty 2

## 2012-04-16 NOTE — ED Provider Notes (Signed)
Medical screening examination/treatment/procedure(s) were performed by non-physician practitioner and as supervising physician I was immediately available for consultation/collaboration.   Benny Lennert, MD 04/16/12 1440

## 2012-04-16 NOTE — Discharge Instructions (Signed)
Please apply heating pad to your back. See your therapy team tomorrow as scheduled. Use Robaxin for spasm and Mobic for inflammation daily. Use Norco for pain if needed. Back Pain, Adult Low back pain is very common. About 1 in 5 people have back pain.The cause of low back pain is rarely dangerous. The pain often gets better over time.About half of people with a sudden onset of back pain feel better in just 2 weeks. About 8 in 10 people feel better by 6 weeks.  CAUSES Some common causes of back pain include:  Strain of the muscles or ligaments supporting the spine.   Wear and tear (degeneration) of the spinal discs.   Arthritis.   Direct injury to the back.  DIAGNOSIS Most of the time, the direct cause of low back pain is not known.However, back pain can be treated effectively even when the exact cause of the pain is unknown.Answering your caregiver's questions about your overall health and symptoms is one of the most accurate ways to make sure the cause of your pain is not dangerous. If your caregiver needs more information, he or she may order lab work or imaging tests (X-rays or MRIs).However, even if imaging tests show changes in your back, this usually does not require surgery. HOME CARE INSTRUCTIONS For many people, back pain returns.Since low back pain is rarely dangerous, it is often a condition that people can learn to [facility] their own.   Remain active. It is stressful on the back to sit or stand in one place. Do not sit, drive, or stand in one place for more than 30 minutes at a time. Take short walks on level surfaces as soon as pain allows.Try to increase the length of time you walk each day.   Do not stay in bed.Resting more than 1 or 2 days can delay your recovery.   Do not avoid exercise or work.Your body is made to move.It is not dangerous to be active, even though your back may hurt.Your back will likely heal faster if you return to being active before your pain is  gone.   Pay attention to your body when you bend and lift. Many people have less discomfortwhen lifting if they bend their knees, keep the load close to their bodies,and avoid twisting. Often, the most comfortable positions are those that put less stress on your recovering back.   Find a comfortable position to sleep. Use a firm mattress and lie on your side with your knees slightly bent. If you lie on your back, put a pillow under your knees.   Only take over-the-counter or prescription medicines as directed by your caregiver. Over-the-counter medicines to reduce pain and inflammation are often the most helpful.Your caregiver may prescribe muscle relaxant drugs.These medicines help dull your pain so you can more quickly return to your normal activities and healthy exercise.   Put ice on the injured area.   Put ice in a plastic bag.   Place a towel between your skin and the bag.   Leave the ice on for 15 to 20 minutes, 3 to 4 times a day for the first 2 to 3 days. After that, ice and heat may be alternated to reduce pain and spasms.   Ask your caregiver about trying back exercises and gentle massage. This may be of some benefit.   Avoid feeling anxious or stressed.Stress increases muscle tension and can worsen back pain.It is important to recognize when you are anxious or stressed and learn  ways to manage it.Exercise is a great option.  SEEK MEDICAL CARE IF:  You have pain that is not relieved with rest or medicine.   You have pain that does not improve in 1 week.   You have new symptoms.   You are generally not feeling well.  SEEK IMMEDIATE MEDICAL CARE IF:   You have pain that radiates from your back into your legs.   You develop new bowel or bladder control problems.   You have unusual weakness or numbness in your arms or legs.   You develop nausea or vomiting.   You develop abdominal pain.   You feel faint.  Document Released: 11/22/2005 Document Revised:  11/11/2011 Document Reviewed: 04/12/2011 [facility] Patient Information 2012 [place], [place].

## 2012-04-16 NOTE — ED Notes (Signed)
Lower back pain since injury on April 1st.

## 2012-04-16 NOTE — ED Provider Notes (Signed)
History     CSN: [id number]  Arrival date & time 04/16/12  4098   First MD Initiated Contact with Patient 04/16/12 [phone]      Chief Complaint  Patient presents with  . Back Pain    (Consider location/radiation/quality/duration/timing/severity/associated sxs/prior treatment) HPI Comments: Pt hurt her back April 1 while  cleaning a shower at work. Seen by Dr Lovell Sheehan (Neurosurg) 16th of April. Diagnosed with pulled muscle . Scheduled for therapy on 24th of April but hurt back while pushing trash in dumpster. Saw occupational health. Placed on light duty at work. To see Dr Lovell Sheehan on 31st. Of May. For physical therapy tomorrow 5/13. Pt continues to have pain an is out of hydrocodone. Request assistance with pain.  Patient is a 26 y.o. female presenting with back pain. The history is provided by the patient.  Back Pain  Pertinent negatives include no chest pain, no abdominal pain and no dysuria.    Past Medical History  Diagnosis Date  . Asthma   . Scoliosis     History reviewed. No pertinent past surgical history.  No family history on file.  History  Substance Use Topics  . Smoking status: Former Smoker    Quit date: 02/14/2012  . Smokeless tobacco: Not on file  . Alcohol Use: No    OB History    Grav Para Term Preterm Abortions TAB SAB Ect Mult Living                  Review of Systems  Constitutional: Negative for activity change.       All ROS Neg except as noted in HPI  HENT: Negative for nosebleeds and neck pain.   Eyes: Negative for photophobia and discharge.  Respiratory: Positive for cough and wheezing. Negative for shortness of breath.   Cardiovascular: Negative for chest pain and palpitations.  Gastrointestinal: Negative for abdominal pain and blood in stool.  Genitourinary: Negative for dysuria, frequency and hematuria.  Musculoskeletal: Positive for back pain. Negative for arthralgias.  Skin: Negative.   Neurological: Negative for dizziness, seizures  and speech difficulty.  Psychiatric/Behavioral: Negative for hallucinations and confusion.    Allergies  Other  Home Medications   Current Outpatient Rx  Name Route Sig Dispense Refill  . ALBUTEROL SULFATE HFA 108 (90 BASE) MCG/ACT IN AERS Inhalation Inhale 1-2 puffs into the lungs every 6 (six) hours as needed for wheezing. 1 Inhaler 0  . BUDESONIDE-FORMOTEROL FUMARATE 80-4.5 MCG/ACT IN AERO Inhalation Inhale 2 puffs into the lungs 2 (two) times daily. 1 Inhaler 0  . CLOMIPHENE CITRATE 50 MG PO TABS Oral Take 50 mg by mouth See admin instructions. Take one tablet by mouth each day on days 3 through 7 of menstrual cycle.    . ETODOLAC 400 MG PO TABS Oral Take 400 mg by mouth 2 (two) times daily with a meal.    . HYDROCODONE-ACETAMINOPHEN 10-325 MG PO TABS Oral Take 1 tablet by mouth every 4 (four) hours as needed. For pain    . METHOCARBAMOL 500 MG PO TABS Oral Take 1,000 mg by mouth 3 (three) times daily as needed. For pain    . ONE-A-DAY WOMENS FORMULA PO Oral Take 1 tablet by mouth daily.    Marland Kitchen PREDNISONE 10 MG PO TABS Oral Take 2 tablets (20 mg total) by mouth daily. Take with food 12 tablet 0  . PREDNISONE 50 MG PO TABS Oral Take 1 tablet (50 mg total) by mouth daily. 6 tablet 0  BP 106/60  Pulse 86  Temp(Src) 98.1 F (36.7 C) (Oral)  Resp 16  Ht 5\' 7"  (1.702 m)  Wt 135 lb (61.236 kg)  BMI 21.14 kg/m2  SpO2 100%  LMP 03/21/2012  Physical Exam  Nursing note and vitals reviewed. Constitutional: She is oriented to person, place, and time. She appears well-developed and well-nourished.  Non-toxic appearance.  HENT:  Head: Normocephalic.  Right Ear: Tympanic membrane and external ear normal.  Left Ear: Tympanic membrane and external ear normal.  Eyes: EOM and lids are normal. Pupils are equal, round, and reactive to light.  Neck: Normal range of motion. Neck supple. Carotid bruit is not present.  Cardiovascular: Normal rate, regular rhythm, normal heart sounds, intact  distal pulses and normal pulses.   Pulmonary/Chest: Breath sounds normal. No respiratory distress.  Abdominal: Soft. Bowel sounds are normal. There is no tenderness. There is no guarding.  Musculoskeletal: Normal range of motion.       Pain to palpation and ROM of the lumbar area, some tightness of the lower lumbar area.  Lymphadenopathy:       Head (right side): No submandibular adenopathy present.       Head (left side): No submandibular adenopathy present.    She has no cervical adenopathy.  Neurological: She is alert and oriented to person, place, and time. She has normal strength. No cranial nerve deficit or sensory deficit. Coordination normal.       No gross neuro deficits. No significant changes in gate or strength.  Skin: Skin is warm and dry.  Psychiatric: She has a normal mood and affect. Her speech is normal.    ED Course  Procedures (including critical care time)  Labs Reviewed - No data to display No results found.   No diagnosis found.    MDM  I have reviewed nursing notes, vital signs, and all appropriate lab and imaging results for this patient. Rx for Mobic, Robaxin and Hydrocodone given to pt. She is scheduled for therapy May 13.       Kathie Dike, [place] 04/16/12 [phone]

## 2012-04-16 NOTE — ED Notes (Signed)
Pt seen and evaluated prior to my assessing patient. C/o pain in her back and out of hydrocodone. States that she is supposed to see her MD next week.

## 2012-05-17 ENCOUNTER — Emergency Department (HOSPITAL_COMMUNITY)
Admission: EM | Admit: 2012-05-17 | Discharge: 2012-05-17 | Disposition: A | Discharge status: Home / Self Care | Payer: BC Managed Care – PPO | Attending: Emergency Medicine | Admitting: Emergency Medicine

## 2012-05-17 ENCOUNTER — Encounter (HOSPITAL_COMMUNITY): Payer: Self-pay | Admitting: *Deleted

## 2012-05-17 DIAGNOSIS — Z87891 Personal history of nicotine dependence: Secondary | ICD-10-CM | POA: Insufficient documentation

## 2012-05-17 DIAGNOSIS — M4120 Other idiopathic scoliosis, site unspecified: Secondary | ICD-10-CM | POA: Insufficient documentation

## 2012-05-17 DIAGNOSIS — M549 Dorsalgia, unspecified: Secondary | ICD-10-CM | POA: Insufficient documentation

## 2012-05-17 DIAGNOSIS — G8929 Other chronic pain: Secondary | ICD-10-CM

## 2012-05-17 DIAGNOSIS — J45909 Unspecified asthma, uncomplicated: Secondary | ICD-10-CM | POA: Insufficient documentation

## 2012-05-17 MED ORDER — IBUPROFEN 800 MG PO TABS
800.0000 mg | ORAL_TABLET | Freq: Once | ORAL | Status: AC
  Administered 2012-05-17: 800 mg via ORAL
  Filled 2012-05-17: qty 1

## 2012-05-17 MED ORDER — OXYCODONE HCL 5 MG PO TABS
5.0000 mg | ORAL_TABLET | Freq: Once | ORAL | Status: AC
  Administered 2012-05-17: 5 mg via ORAL
  Filled 2012-05-17: qty 1

## 2012-05-17 MED ORDER — METHOCARBAMOL 500 MG PO TABS
1000.0000 mg | ORAL_TABLET | Freq: Once | ORAL | Status: AC
  Administered 2012-05-17: 1000 mg via ORAL
  Filled 2012-05-17: qty 2

## 2012-05-17 NOTE — ED Provider Notes (Signed)
Medical screening examination/treatment/procedure(s) were performed by non-physician practitioner and as supervising physician I was immediately available for consultation/collaboration.   Milea Klink W Nikea Settle, MD 05/17/12 1424 

## 2012-05-17 NOTE — Discharge Instructions (Signed)
Please see Dr Craige Cotta and Dr Lovell Sheehan for assistance with your on-going back pain problem.Chronic Pain Chronic pain can be defined as pain that is lasting, off and on, and lasts for 3 to 6 months or longer. Many things cause chronic pain, which can make it difficult to make a discrete diagnosis. There are many treatment options available for chronic pain. However, finding a treatment that works well for you may require trying various approaches until a suitable one is found. CAUSES  In some types of chronic medical conditions, the pain is caused by a normal pain response within the body. A normal pain response helps the body identify illness or injury and prevent further damage from being done. In these cases, the cause of the pain may be identified and treated, even if it may not be cured completely. Examples of chronic conditions which can cause chronic pain include:  Inflammation of the joints (arthritis).   Back pain or neck pain (including bulging or herniated disks).   Migraine headaches.   Cancer.  In some other types of chronic pain syndromes, the pain is caused by an abnormal pain response within the body. An abnormal pain response is present when there is no ongoing cause (or stimulus) for the pain, or when the cause of the pain is arising from the nerves or nervous system itself. Examples of conditions which can cause chronic pain due to an abnormal pain response include:  Fibromyalgia.   Reflex sympathetic dystrophy (RSD).   Neuropathy (when the nerves themselves are damaged, and may cause pain).  DIAGNOSIS  Your caregiver will help diagnose your condition over time. In many cases, the initial focus will be on excluding conditions that could be causing the pain. Depending on your symptoms, your caregiver may order some tests to diagnose your condition. Some of these tests include:  Blood tests.   Computerized X-ray scans (CT scan).   Computerized magnetic scans (MRI).   X-rays.     Ultrasounds.   Nerve conduction studies.   Consultation with other physicians or specialists.  TREATMENT  There are many treatment options for people suffering from chronic pain. Finding a treatment that works well may take time.   You may be referred to a pain management specialist.   You may be put on medication to help with the pain. Unfortunately, some medications (such as opiate medications) may not be very effective in cases where chronic pain is due to abnormal pain responses. Finding the right medications can take some time.   Adjunctive therapies may be used to provide additional relief and improve a patient's quality of life. These therapies include:   Mindfulness meditation.   Acupuncture.   Biofeedback.   Cognitive-behavioral therapy.   In certain cases, surgical interventions may be attempted.  HOME CARE INSTRUCTIONS   Make sure you understand these instructions prior to discharge.   Ask any questions and share any further concerns you have with your caregiver prior to discharge.   Take all medications as directed by your caregiver.   Keep all follow-up appointments.  SEEK MEDICAL CARE IF:   Your pain gets worse.   You develop a new pain that was not present before.   You cannot tolerate any medications prescribed by your caregiver.   You develop new symptoms since your last visit with your caregiver.  SEEK IMMEDIATE MEDICAL CARE IF:   You develop muscular weakness.   You have decreased sensation or numbness.   You lose control of bowel or bladder  function.   Your pain suddenly gets much worse.   You have an oral temperature above 102 F (38.9 C), not controlled by medication.   You develop shaking chills, confusion, chest pain, or shortness of breath.  Document Released: 08/14/2002 Document Revised: 11/11/2011 Document Reviewed: 11/20/2008 [facility] Patient Information 2012 [place], [place].

## 2012-05-17 NOTE — ED Notes (Signed)
Lower back pain, worse this morning.

## 2012-05-17 NOTE — ED Provider Notes (Signed)
History     CSN: [id number]  Arrival date & time 05/17/12  1216   None     Chief Complaint  Patient presents with  . Back Pain    (Consider location/radiation/quality/duration/timing/severity/associated sxs/prior treatment) Patient is a 26 y.o. female presenting with back pain. The history is provided by the patient.  Back Pain  This is a recurrent problem. The current episode started yesterday. The problem occurs daily. The problem has been gradually worsening. Associated with: Pt sustained a fall and twisting injury in April 2013. The pain is present in the lumbar spine. The quality of the pain is described as aching. The pain radiates to the left thigh and left knee. The pain is moderate. The symptoms are aggravated by certain positions. The pain is the same all the time. Pertinent negatives include no chest pain, no abdominal pain and no dysuria.    Past Medical History  Diagnosis Date  . Asthma   . Scoliosis     History reviewed. No pertinent past surgical history.  No family history on file.  History  Substance Use Topics  . Smoking status: Former Smoker    Quit date: 02/14/2012  . Smokeless tobacco: Not on file  . Alcohol Use: No    OB History    Grav Para Term Preterm Abortions TAB SAB Ect Mult Living                  Review of Systems  Constitutional: Negative for activity change.       All ROS Neg except as noted in HPI  HENT: Negative for nosebleeds and neck pain.   Eyes: Negative for photophobia and discharge.  Respiratory: Negative for cough, shortness of breath and wheezing.   Cardiovascular: Negative for chest pain and palpitations.  Gastrointestinal: Negative for abdominal pain and blood in stool.  Genitourinary: Negative for dysuria, frequency and hematuria.  Musculoskeletal: Positive for back pain. Negative for arthralgias.  Skin: Negative.   Neurological: Negative for dizziness, seizures and speech difficulty.  Psychiatric/Behavioral:  Negative for hallucinations and confusion.    Allergies  Tylenol  Home Medications   Current Outpatient Rx  Name Route Sig Dispense Refill  . ALBUTEROL SULFATE HFA 108 (90 BASE) MCG/ACT IN AERS Inhalation Inhale 1-2 puffs into the lungs every 6 (six) hours as needed for wheezing. 1 Inhaler 0  . BUDESONIDE-FORMOTEROL FUMARATE 80-4.5 MCG/ACT IN AERO Inhalation Inhale 2 puffs into the lungs 2 (two) times daily. 1 Inhaler 0  . CLOMIPHENE CITRATE 50 MG PO TABS Oral Take 50 mg by mouth See admin instructions. Take one tablet by mouth each day on days 3 through 7 of menstrual cycle.    . ETODOLAC 400 MG PO TABS Oral Take 400 mg by mouth 2 (two) times daily with a meal.    . HYDROCODONE-ACETAMINOPHEN 10-325 MG PO TABS Oral Take 1 tablet by mouth every 4 (four) hours as needed. For pain    . MELOXICAM 7.5 MG PO TABS  1 po bid with food 12 tablet 0  . METHOCARBAMOL 500 MG PO TABS Oral Take 1,000 mg by mouth 3 (three) times daily as needed. For pain    . METHOCARBAMOL 500 MG PO TABS  2 po tid for spasm 20 tablet 0  . ONE-A-DAY WOMENS FORMULA PO Oral Take 1 tablet by mouth daily.    Marland Kitchen PREDNISONE 10 MG PO TABS Oral Take 2 tablets (20 mg total) by mouth daily. Take with food 12 tablet 0  . PREDNISONE  50 MG PO TABS Oral Take 1 tablet (50 mg total) by mouth daily. 6 tablet 0    BP 115/67  Pulse 84  Temp 98.5 F (36.9 C) (Oral)  Resp 16  Ht 5\' 7"  (1.702 m)  Wt 135 lb (61.236 kg)  BMI 21.14 kg/m2  SpO2 100%  LMP 04/19/2012  Physical Exam  Nursing note and vitals reviewed. Constitutional: She is oriented to person, place, and time. She appears well-developed and well-nourished.  Non-toxic appearance.  HENT:  Head: Normocephalic.  Right Ear: Tympanic membrane and external ear normal.  Left Ear: Tympanic membrane and external ear normal.  Eyes: EOM and lids are normal. Pupils are equal, round, and reactive to light.  Neck: Normal range of motion. Neck supple. Carotid bruit is not present.    Cardiovascular: Normal rate, regular rhythm, normal heart sounds, intact distal pulses and normal pulses.   Pulmonary/Chest: Breath sounds normal. No respiratory distress.  Abdominal: Soft. Bowel sounds are normal. There is no tenderness. There is no guarding.  Musculoskeletal: Normal range of motion.       Left paraspinal lower back tenderness. Pain in the lower back with attempted ROM or manipulation of the left leg.  Lymphadenopathy:       Head (right side): No submandibular adenopathy present.       Head (left side): No submandibular adenopathy present.    She has no cervical adenopathy.  Neurological: She is alert and oriented to person, place, and time. She has normal strength. No cranial nerve deficit or sensory deficit.       No gross neuro deficit. Gait wnl.  Skin: Skin is warm and dry.  Psychiatric: She has a normal mood and affect. Her speech is normal.    ED Course  Procedures (including critical care time)  Labs Reviewed - No data to display No results found.   No diagnosis found.    MDM  I have reviewed nursing notes, vital signs, and all appropriate lab and imaging results for this patient. No gross neuro deficit. Old records reviewed, no new changes on today's exam. Discussed with pt the need for pain control by her pcp. She understands plan.        Kathie Dike, [place] 05/17/12 1328

## 2012-05-25 ENCOUNTER — Other Ambulatory Visit: Payer: Self-pay | Admitting: Neurosurgery

## 2012-05-25 DIAGNOSIS — M549 Dorsalgia, unspecified: Secondary | ICD-10-CM

## 2012-05-27 ENCOUNTER — Ambulatory Visit
Admission: RE | Admit: 2012-05-27 | Discharge: 2012-05-27 | Disposition: A | Discharge status: Home / Self Care | Payer: BC Managed Care – PPO | Source: Ambulatory Visit | Attending: Neurosurgery | Admitting: Neurosurgery

## 2012-05-27 DIAGNOSIS — M549 Dorsalgia, unspecified: Secondary | ICD-10-CM

## 2012-06-27 ENCOUNTER — Other Ambulatory Visit: Payer: Self-pay | Admitting: Neurosurgery

## 2012-06-29 ENCOUNTER — Encounter (HOSPITAL_COMMUNITY): Payer: Self-pay | Admitting: Pharmacy Technician

## 2012-07-03 ENCOUNTER — Encounter (HOSPITAL_COMMUNITY)
Admission: RE | Admit: 2012-07-03 | Discharge: 2012-07-03 | Disposition: A | Discharge status: Home / Self Care | Payer: BC Managed Care – PPO | Source: Ambulatory Visit | Attending: Neurosurgery | Admitting: Neurosurgery

## 2012-07-03 ENCOUNTER — Encounter (HOSPITAL_COMMUNITY): Payer: Self-pay

## 2012-07-03 LAB — CBC
HCT: 41.7 % (ref 36.0–46.0)
MCH: 33.3 pg (ref 26.0–34.0)
MCHC: 34.3 g/dL (ref 30.0–36.0)
MCV: 97.2 fL (ref 78.0–100.0)
RDW: 12.1 % (ref 11.5–15.5)

## 2012-07-03 NOTE — Pre-Procedure Instructions (Addendum)
20 BRIENNA BASS  07/03/2012   Your procedure is scheduled on:  Thursday, August 1st.  Report to [facility] at 8:00AM.  Call this number if you have problems the morning of surgery: [phone]   Remember:   Do not eat food or drink any liquid:After Midnight    Take these medicines the morning of surgery with A SIP OF WATER:  Alprazolam (Xanax) and Hydrocodone- Acetaminophen (Norco) if needed.  Use Albuterol inhaler and bring to the hospital with you.    Do not take any Aspirin, NSAIDs  Coumadin, Effient or herbal medications.  Do not wear jewelry, make-up or nail polish.  Do not wear lotions, powders, or perfumes. You may wear deodorant.  Do not shave 48 hours prior to surgery. Men may shave face and neck.  Do not bring valuables to the hospital.  Contacts, dentures or bridgework may not be worn into surgery.  Leave suitcase in the car. After surgery it may be brought to your room.  For patients admitted to the hospital, checkout time is 11:00 AM the day of discharge.   Patients discharged the day of surgery will not be allowed to drive home.  Name and phone number of your driver: NA  Special Instructions: CHG Shower Use Special Wash: 1/2 bottle night before surgery and 1/2 bottle morning of surgery.   Please read over the following fact sheets that you were given: Pain Booklet, Coughing and Deep Breathing and Surgical Site Infection Prevention

## 2012-07-03 NOTE — Progress Notes (Signed)
I called Dr Lovell Sheehan office and asked for orders to be signed.

## 2012-07-03 NOTE — Progress Notes (Signed)
the patient stopped taking Clomid in June.

## 2012-07-05 MED ORDER — CEFAZOLIN SODIUM-DEXTROSE 2-3 GM-% IV SOLR
2.0000 g | INTRAVENOUS | Status: AC
  Administered 2012-07-06: 2 g via INTRAVENOUS
  Filled 2012-07-05: qty 50

## 2012-07-05 NOTE — Progress Notes (Signed)
I received a call from Olivia Fuller in [facility] , who had a call from Neuro PACU personal informing her that Olivia Fuller should arrive at 1000 on 07/06/12, and that I should notify the patient. I called the pts home number and left a message on her voice mail to arrive at 1000, on Jul 06, 2012.

## 2012-07-06 ENCOUNTER — Ambulatory Visit (HOSPITAL_COMMUNITY): Payer: BC Managed Care – PPO

## 2012-07-06 ENCOUNTER — Encounter (HOSPITAL_COMMUNITY): Payer: Self-pay | Admitting: Anesthesiology

## 2012-07-06 ENCOUNTER — Encounter (HOSPITAL_COMMUNITY)
Admission: RE | Disposition: A | Discharge status: Home / Self Care | Payer: Self-pay | Source: Ambulatory Visit | Attending: Neurosurgery

## 2012-07-06 ENCOUNTER — Ambulatory Visit (HOSPITAL_COMMUNITY): Payer: BC Managed Care – PPO | Admitting: Anesthesiology

## 2012-07-06 ENCOUNTER — Inpatient Hospital Stay (HOSPITAL_COMMUNITY)
Admission: RE | Admit: 2012-07-06 | Discharge: 2012-07-07 | DRG: 758 | Disposition: A | Discharge status: Home / Self Care | Payer: BC Managed Care – PPO | Source: Ambulatory Visit | Attending: Neurosurgery | Admitting: Neurosurgery

## 2012-07-06 ENCOUNTER — Encounter (HOSPITAL_COMMUNITY): Payer: Self-pay | Admitting: *Deleted

## 2012-07-06 DIAGNOSIS — J45909 Unspecified asthma, uncomplicated: Secondary | ICD-10-CM | POA: Diagnosis present

## 2012-07-06 DIAGNOSIS — M5126 Other intervertebral disc displacement, lumbar region: Secondary | ICD-10-CM

## 2012-07-06 DIAGNOSIS — IMO0002 Reserved for concepts with insufficient information to code with codable children: Secondary | ICD-10-CM | POA: Diagnosis present

## 2012-07-06 DIAGNOSIS — Z01812 Encounter for preprocedural laboratory examination: Secondary | ICD-10-CM

## 2012-07-06 DIAGNOSIS — Z79899 Other long term (current) drug therapy: Secondary | ICD-10-CM

## 2012-07-06 HISTORY — PX: LUMBAR LAMINECTOMY/DECOMPRESSION MICRODISCECTOMY: SHX5026

## 2012-07-06 SURGERY — LUMBAR LAMINECTOMY/DECOMPRESSION MICRODISCECTOMY 1 LEVEL
Anesthesia: General | Laterality: Left | Wound class: Clean

## 2012-07-06 MED ORDER — MENTHOL 3 MG MT LOZG: 1.0000 | LOZENGE | OROMUCOSAL | Status: DC | PRN

## 2012-07-06 MED ORDER — ONDANSETRON HCL 4 MG/2ML IJ SOLN
4.0000 mg | Freq: Once | INTRAMUSCULAR | Status: AC | PRN
  Administered 2012-07-06: 4 mg via INTRAVENOUS

## 2012-07-06 MED ORDER — CEFAZOLIN SODIUM-DEXTROSE 2-3 GM-% IV SOLR
2.0000 g | Freq: Three times a day (TID) | INTRAVENOUS | Status: AC
  Administered 2012-07-06 – 2012-07-07 (×2): 2 g via INTRAVENOUS
  Filled 2012-07-06 (×3): qty 50

## 2012-07-06 MED ORDER — HYDROMORPHONE HCL PF 1 MG/ML IJ SOLN
0.2500 mg | INTRAMUSCULAR | Status: DC | PRN
  Administered 2012-07-06 (×2): 0.5 mg via INTRAVENOUS

## 2012-07-06 MED ORDER — SODIUM CHLORIDE 0.9 % IR SOLN
Status: DC | PRN
  Administered 2012-07-06: 15:00:00

## 2012-07-06 MED ORDER — ALBUTEROL SULFATE HFA 108 (90 BASE) MCG/ACT IN AERS: 1.0000 | INHALATION_SPRAY | Freq: Four times a day (QID) | RESPIRATORY_TRACT | Status: DC | PRN

## 2012-07-06 MED ORDER — GLYCOPYRROLATE 0.2 MG/ML IJ SOLN
INTRAMUSCULAR | Status: DC | PRN
  Administered 2012-07-06: .4 mg via INTRAVENOUS

## 2012-07-06 MED ORDER — ONDANSETRON HCL 4 MG/2ML IJ SOLN: 4.0000 mg | INTRAMUSCULAR | Status: DC | PRN

## 2012-07-06 MED ORDER — BUPIVACAINE-EPINEPHRINE PF 0.5-1:200000 % IJ SOLN
INTRAMUSCULAR | Status: DC | PRN
  Administered 2012-07-06 (×2): 10 mL

## 2012-07-06 MED ORDER — ACETAMINOPHEN 10 MG/ML IV SOLN
INTRAVENOUS | Status: AC
  Administered 2012-07-06: 1000 mg via INTRAVENOUS
  Filled 2012-07-06: qty 100

## 2012-07-06 MED ORDER — ONDANSETRON HCL 4 MG/2ML IJ SOLN
INTRAMUSCULAR | Status: AC
  Filled 2012-07-06: qty 2

## 2012-07-06 MED ORDER — DEXAMETHASONE SODIUM PHOSPHATE 4 MG/ML IJ SOLN
INTRAMUSCULAR | Status: DC | PRN
  Administered 2012-07-06: 8 mg via INTRAVENOUS

## 2012-07-06 MED ORDER — LACTATED RINGERS IV SOLN
INTRAVENOUS | Status: DC
  Administered 2012-07-06 – 2012-07-07 (×2): via INTRAVENOUS

## 2012-07-06 MED ORDER — HYDROMORPHONE HCL PF 1 MG/ML IJ SOLN
INTRAMUSCULAR | Status: AC
  Filled 2012-07-06: qty 1

## 2012-07-06 MED ORDER — 0.9 % SODIUM CHLORIDE (POUR BTL) OPTIME
TOPICAL | Status: DC | PRN
  Administered 2012-07-06: 1000 mL

## 2012-07-06 MED ORDER — HEMOSTATIC AGENTS (NO CHARGE) OPTIME
TOPICAL | Status: DC | PRN
  Administered 2012-07-06: 1 via TOPICAL

## 2012-07-06 MED ORDER — LIDOCAINE HCL (CARDIAC) 20 MG/ML IV SOLN
INTRAVENOUS | Status: DC | PRN
  Administered 2012-07-06: 100 mg via INTRAVENOUS

## 2012-07-06 MED ORDER — NEOSTIGMINE METHYLSULFATE 1 MG/ML IJ SOLN
INTRAMUSCULAR | Status: DC | PRN
  Administered 2012-07-06: 3.5 mg via INTRAVENOUS

## 2012-07-06 MED ORDER — HYDROCODONE-ACETAMINOPHEN 5-325 MG PO TABS
1.0000 | ORAL_TABLET | Freq: Once | ORAL | Status: AC
  Administered 2012-07-06: 1 via ORAL
  Filled 2012-07-06: qty 1

## 2012-07-06 MED ORDER — PHENYLEPHRINE HCL 10 MG/ML IJ SOLN
INTRAMUSCULAR | Status: DC | PRN
  Administered 2012-07-06 (×3): 100 ug via INTRAVENOUS

## 2012-07-06 MED ORDER — BACITRACIN 50000 UNITS IM SOLR
INTRAMUSCULAR | Status: AC
  Filled 2012-07-06: qty 1

## 2012-07-06 MED ORDER — ALPRAZOLAM 0.25 MG PO TABS
0.5000 mg | ORAL_TABLET | Freq: Once | ORAL | Status: AC
  Administered 2012-07-06: 0.5 mg via ORAL
  Filled 2012-07-06 (×2): qty 2

## 2012-07-06 MED ORDER — DOCUSATE SODIUM 100 MG PO CAPS
100.0000 mg | ORAL_CAPSULE | Freq: Two times a day (BID) | ORAL | Status: DC
  Administered 2012-07-06: 100 mg via ORAL
  Filled 2012-07-06: qty 1

## 2012-07-06 MED ORDER — ONDANSETRON HCL 4 MG/2ML IJ SOLN
INTRAMUSCULAR | Status: DC | PRN
  Administered 2012-07-06: 4 mg via INTRAVENOUS

## 2012-07-06 MED ORDER — DEXTROSE 5 % IV SOLN
INTRAVENOUS | Status: DC | PRN
  Administered 2012-07-06: 14:00:00 via INTRAVENOUS

## 2012-07-06 MED ORDER — DIAZEPAM 5 MG PO TABS: 5.0000 mg | ORAL_TABLET | Freq: Four times a day (QID) | ORAL | Status: DC | PRN

## 2012-07-06 MED ORDER — OXYCODONE-ACETAMINOPHEN 5-325 MG PO TABS
1.0000 | ORAL_TABLET | ORAL | Status: DC | PRN
  Administered 2012-07-06 – 2012-07-07 (×2): 2 via ORAL
  Filled 2012-07-06 (×2): qty 2

## 2012-07-06 MED ORDER — MORPHINE SULFATE 2 MG/ML IJ SOLN
1.0000 mg | INTRAMUSCULAR | Status: DC | PRN
  Administered 2012-07-06: 2 mg via INTRAVENOUS
  Filled 2012-07-06: qty 1

## 2012-07-06 MED ORDER — ZOLPIDEM TARTRATE 5 MG PO TABS: 5.0000 mg | ORAL_TABLET | Freq: Every evening | ORAL | Status: DC | PRN

## 2012-07-06 MED ORDER — ROCURONIUM BROMIDE 100 MG/10ML IV SOLN
INTRAVENOUS | Status: DC | PRN
  Administered 2012-07-06: 50 mg via INTRAVENOUS

## 2012-07-06 MED ORDER — FENTANYL CITRATE 0.05 MG/ML IJ SOLN
INTRAMUSCULAR | Status: DC | PRN
  Administered 2012-07-06: 100 ug via INTRAVENOUS
  Administered 2012-07-06: 150 ug via INTRAVENOUS

## 2012-07-06 MED ORDER — LACTATED RINGERS IV SOLN
INTRAVENOUS | Status: DC | PRN
  Administered 2012-07-06 (×2): via INTRAVENOUS

## 2012-07-06 MED ORDER — THROMBIN 5000 UNITS EX SOLR
CUTANEOUS | Status: DC | PRN
  Administered 2012-07-06 (×2): 5000 [IU] via TOPICAL

## 2012-07-06 MED ORDER — PROPOFOL 10 MG/ML IV EMUL
INTRAVENOUS | Status: DC | PRN
  Administered 2012-07-06: 125 mg via INTRAVENOUS

## 2012-07-06 MED ORDER — HYDROCODONE-ACETAMINOPHEN 5-325 MG PO TABS: 1.0000 | ORAL_TABLET | ORAL | Status: DC | PRN

## 2012-07-06 MED ORDER — PRENATAL MULTIVITAMIN CH
1.0000 | ORAL_TABLET | Freq: Every day | ORAL | Status: DC
  Filled 2012-07-06: qty 1

## 2012-07-06 MED ORDER — BUDESONIDE-FORMOTEROL FUMARATE 80-4.5 MCG/ACT IN AERO
2.0000 | INHALATION_SPRAY | Freq: Two times a day (BID) | RESPIRATORY_TRACT | Status: DC
  Administered 2012-07-06 – 2012-07-07 (×2): 2 via RESPIRATORY_TRACT
  Filled 2012-07-06: qty 6.9

## 2012-07-06 MED ORDER — SODIUM CHLORIDE 0.9 % IV SOLN
INTRAVENOUS | Status: AC
  Filled 2012-07-06: qty 500

## 2012-07-06 MED ORDER — ALPRAZOLAM 0.5 MG PO TABS: 0.5000 mg | ORAL_TABLET | Freq: Three times a day (TID) | ORAL | Status: DC | PRN

## 2012-07-06 MED ORDER — PHENOL 1.4 % MT LIQD: 1.0000 | OROMUCOSAL | Status: DC | PRN

## 2012-07-06 MED ORDER — MIDAZOLAM HCL 5 MG/5ML IJ SOLN
INTRAMUSCULAR | Status: DC | PRN
  Administered 2012-07-06: 2 mg via INTRAVENOUS

## 2012-07-06 MED ORDER — BACITRACIN ZINC 500 UNIT/GM EX OINT
TOPICAL_OINTMENT | CUTANEOUS | Status: DC | PRN
  Administered 2012-07-06: 1 via TOPICAL

## 2012-07-06 SURGICAL SUPPLY — 54 items
BAG DECANTER FOR FLEXI CONT (MISCELLANEOUS) ×2 IMPLANT
BENZOIN TINCTURE PRP APPL 2/3 (GAUZE/BANDAGES/DRESSINGS) ×2 IMPLANT
BLADE SURG ROTATE 9660 (MISCELLANEOUS) IMPLANT
BRUSH SCRUB EZ PLAIN DRY (MISCELLANEOUS) ×2 IMPLANT
BUR ACORN 6.0 (BURR) ×2 IMPLANT
BUR MATCHSTICK NEURO 3.0 LAGG (BURR) ×2 IMPLANT
CANISTER SUCTION 2500CC (MISCELLANEOUS) ×2 IMPLANT
CLOTH BEACON ORANGE TIMEOUT ST (SAFETY) ×2 IMPLANT
CONT SPEC 4OZ CLIKSEAL STRL BL (MISCELLANEOUS) ×2 IMPLANT
DRAPE LAPAROTOMY 100X72X124 (DRAPES) ×2 IMPLANT
DRAPE MICROSCOPE LEICA (MISCELLANEOUS) ×2 IMPLANT
DRAPE POUCH INSTRU U-SHP 10X18 (DRAPES) ×2 IMPLANT
DRAPE SURG 17X23 STRL (DRAPES) ×8 IMPLANT
ELECT BLADE 4.0 EZ CLEAN MEGAD (MISCELLANEOUS) ×2
ELECT REM PT RETURN 9FT ADLT (ELECTROSURGICAL) ×2
ELECTRODE BLDE 4.0 EZ CLN MEGD (MISCELLANEOUS) ×1 IMPLANT
ELECTRODE REM PT RTRN 9FT ADLT (ELECTROSURGICAL) ×1 IMPLANT
GAUZE SPONGE 4X4 16PLY XRAY LF (GAUZE/BANDAGES/DRESSINGS) IMPLANT
GLOVE BIO SURGEON STRL SZ8.5 (GLOVE) ×2 IMPLANT
GLOVE BIOGEL M 8.0 STRL (GLOVE) ×2 IMPLANT
GLOVE BIOGEL PI IND STRL 7.0 (GLOVE) ×2 IMPLANT
GLOVE BIOGEL PI INDICATOR 7.0 (GLOVE) ×2
GLOVE EXAM NITRILE LRG STRL (GLOVE) IMPLANT
GLOVE EXAM NITRILE MD LF STRL (GLOVE) ×2 IMPLANT
GLOVE EXAM NITRILE XL STR (GLOVE) IMPLANT
GLOVE EXAM NITRILE XS STR PU (GLOVE) IMPLANT
GLOVE SS BIOGEL STRL SZ 6.5 (GLOVE) ×2 IMPLANT
GLOVE SS BIOGEL STRL SZ 8 (GLOVE) ×1 IMPLANT
GLOVE SUPERSENSE BIOGEL SZ 6.5 (GLOVE) ×2
GLOVE SUPERSENSE BIOGEL SZ 8 (GLOVE) ×1
GOWN BRE IMP SLV AUR LG STRL (GOWN DISPOSABLE) ×4 IMPLANT
GOWN BRE IMP SLV AUR XL STRL (GOWN DISPOSABLE) ×2 IMPLANT
GOWN STRL REIN 2XL LVL4 (GOWN DISPOSABLE) IMPLANT
KIT BASIN OR (CUSTOM PROCEDURE TRAY) ×2 IMPLANT
KIT ROOM TURNOVER OR (KITS) ×2 IMPLANT
NEEDLE HYPO 21X1.5 SAFETY (NEEDLE) IMPLANT
NEEDLE HYPO 22GX1.5 SAFETY (NEEDLE) ×2 IMPLANT
NS IRRIG 1000ML POUR BTL (IV SOLUTION) ×2 IMPLANT
PACK LAMINECTOMY NEURO (CUSTOM PROCEDURE TRAY) ×2 IMPLANT
PAD ARMBOARD 7.5X6 YLW CONV (MISCELLANEOUS) ×6 IMPLANT
PATTIES SURGICAL .5 X1 (DISPOSABLE) IMPLANT
RUBBERBAND STERILE (MISCELLANEOUS) ×4 IMPLANT
SPONGE GAUZE 4X4 12PLY (GAUZE/BANDAGES/DRESSINGS) ×2 IMPLANT
SPONGE SURGIFOAM ABS GEL SZ50 (HEMOSTASIS) ×2 IMPLANT
STRIP CLOSURE SKIN 1/2X4 (GAUZE/BANDAGES/DRESSINGS) ×2 IMPLANT
SUT VIC AB 1 CT1 18XBRD ANBCTR (SUTURE) ×1 IMPLANT
SUT VIC AB 1 CT1 8-18 (SUTURE) ×1
SUT VIC AB 2-0 CP2 18 (SUTURE) ×2 IMPLANT
SYR 20CC LL (SYRINGE) IMPLANT
SYR 20ML ECCENTRIC (SYRINGE) ×2 IMPLANT
TAPE CLOTH SURG 4X10 WHT LF (GAUZE/BANDAGES/DRESSINGS) ×2 IMPLANT
TOWEL OR 17X24 6PK STRL BLUE (TOWEL DISPOSABLE) ×2 IMPLANT
TOWEL OR 17X26 10 PK STRL BLUE (TOWEL DISPOSABLE) ×2 IMPLANT
WATER STERILE IRR 1000ML POUR (IV SOLUTION) ×2 IMPLANT

## 2012-07-06 NOTE — H&P (Signed)
Subjective: The patient is a 26 year old female who has complained of back buttock and left leg pain. She has failed medical management and was worked up with a lumbar MRI. This demonstrated a herniated disc at L5-S1 on the left. She failed nonsurgical management. I discussed the surgical option of a left L5-S1 discectomy with her. The patient has weighed the risks, benefits, alternatives to surgery and decided proceed with the operation.   Past Medical History  Diagnosis Date  . Asthma   . Scoliosis     Past Surgical History  Procedure Date  . No surgical history     Allergies  Allergen Reactions  . Tylenol (Acetaminophen) Itching and Nausea And Vomiting    History  Substance Use Topics  . Smoking status: Former Smoker -- 5 years    Quit date: 02/14/2012  . Smokeless tobacco: Not on file  . Alcohol Use: No    History reviewed. No pertinent family history. Prior to Admission medications   Medication Sig Start Date End Date Taking? Authorizing Provider  ALPRAZolam Prudy Feeler) 0.5 MG tablet Take 0.5 mg by mouth every 8 (eight) hours as needed. As needed for muscle spasms.   Yes Historical Provider, MD  clomiPHENE (CLOMID) 50 MG tablet Take 50 mg by mouth daily as needed. Take on days 3 through 7 of menstrual cycle.   Yes Historical Provider, MD  HYDROcodone-acetaminophen (NORCO) 10-325 MG per tablet Take 1 tablet by mouth every 6 (six) hours as needed. For pain   Yes Historical Provider, MD  Prenatal Vit-Fe Fumarate-FA (PRENATAL MULTIVITAMIN) TABS Take 1 tablet by mouth daily.   Yes Historical Provider, MD  albuterol (PROVENTIL HFA;VENTOLIN HFA) 108 (90 BASE) MCG/ACT inhaler Inhale 1-2 puffs into the lungs every 6 (six) hours as needed. As needed for shortness of breath.    Historical Provider, MD  budesonide-formoterol (SYMBICORT) 80-4.5 MCG/ACT inhaler Inhale 2 puffs into the lungs 2 (two) times daily as needed. As needed for shortness of breath.    Historical Provider, MD     Review  of Systems  Positive ROS: As above  All other systems have been reviewed and were otherwise negative with the exception of those mentioned in the HPI and as above.  Objective: Vital signs in last 24 hours: Temp:  [97.8 F (36.6 C)] 97.8 F (36.6 C) (08/01 0804) Pulse Rate:  [90] 90  (08/01 0804) Resp:  [20] 20  (08/01 0804) BP: (105)/(73) 105/73 mmHg (08/01 0804) SpO2:  [98 %] 98 % (08/01 0804)  General Appearance: Alert, cooperative, no distress, appears stated age Head: Normocephalic, without obvious abnormality, atraumatic Eyes: PERRL, conjunctiva/corneas clear, EOM's intact, fundi benign, both eyes      Ears: Normal TM's and external ear canals, both ears Throat: Lips, mucosa, and tongue normal; teeth and gums normal Neck: Supple, symmetrical, trachea midline, no adenopathy; thyroid: No enlargement/tenderness/nodules; no carotid bruit or JVD Back: Symmetric, no curvature, ROM normal, no CVA tenderness Lungs: Clear to auscultation bilaterally, respirations unlabored Heart: Regular rate and rhythm, S1 and S2 normal, no murmur, rub or gallop Abdomen: Soft, non-tender, bowel sounds active all four quadrants, no masses, no organomegaly Extremities: Extremities normal, atraumatic, no cyanosis or edema Pulses: 2+ and symmetric all extremities Skin: Skin color, texture, turgor normal, no rashes or lesions  NEUROLOGIC:   Mental status: alert and oriented, no aphasia, good attention span, Fund of knowledge/ memory ok Motor Exam - grossly normal Sensory Exam - grossly normal Reflexes:  Coordination - grossly normal Gait - grossly normal  Balance - grossly normal Cranial Nerves: I: smell Not tested  II: visual acuity  OS: Normal    OD: Normal   II: visual fields Full to confrontation  II: pupils Equal, round, reactive to light  III,VII: ptosis None  III,IV,VI: extraocular muscles  Full ROM  V: mastication Normal  V: facial light touch sensation  Normal  V,VII: corneal reflex   Present  VII: facial muscle function - upper  Normal  VII: facial muscle function - lower Normal  VIII: hearing Not tested  IX: soft palate elevation  Normal  IX,X: gag reflex Present  XI: trapezius strength  5/5  XI: sternocleidomastoid strength 5/5  XI: neck flexion strength  5/5  XII: tongue strength  Normal    Data Review Lab Results  Component Value Date   WBC 5.1 07/03/2012   HGB 14.3 07/03/2012   HCT 41.7 07/03/2012   MCV 97.2 07/03/2012   PLT 215 07/03/2012   Lab Results  Component Value Date   NA 141 12/16/2010   K 3.9 12/16/2010   CL 106 12/16/2010   CO2 27 12/16/2010   BUN 14 12/16/2010   CREATININE 0.7 12/16/2010   GLUCOSE 69* 12/16/2010   No results found for this basename: INR, PROTIME    Assessment/Plan: Left L5-S1 herniated disc, lumbar radiculopathy, lumbago: I discussed situation with the patient. I reviewed her MR scan with her and pointed out the abnormalities. We have discussed the various treatment options including a left L5-S1 discectomy. I've shown her surgical models. I discussed the risks, benefits, alternatives and likelihood of achieving our goals with surgery. I have answered all the patient's questions. She has decided to proceed with the operation.   Kalin Kyler D 07/06/2012 1:37 PM

## 2012-07-06 NOTE — Preoperative (Signed)
Beta Blockers   Reason not to administer Beta Blockers:Not Applicable 

## 2012-07-06 NOTE — Progress Notes (Signed)
Subjective:  The patient is mildly somnolent but easily arousable. She looks well. She is in no apparent distress.  Objective: Vital signs in last 24 hours: Temp:  [97.8 F (36.6 C)] 97.8 F (36.6 C) (08/01 1533) Pulse Rate:  [90] 90  (08/01 0804) Resp:  [20] 20  (08/01 0804) BP: (105)/(73) 105/73 mmHg (08/01 0804) SpO2:  [98 %] 98 % (08/01 0804)  Intake/Output from previous day:   Intake/Output this shift: Total I/O In: 1650 [I.V.:1650] Out: 25 [Blood:25]  Physical exam patient is easily arousable. Her strength is normal in her lower extremities.  Lab Results: No results found for this basename: WBC:2,HGB:2,HCT:2,PLT:2 in the last 72 hours BMET No results found for this basename: NA:2,K:2,CL:2,CO2:2,GLUCOSE:2,BUN:2,CREATININE:2,CALCIUM:2 in the last 72 hours  Studies/Results: No results found.  Assessment/Plan: The patient is doing well.  LOS: 0 days     Edward Guthmiller D 07/06/2012, 3:54 PM

## 2012-07-06 NOTE — Op Note (Signed)
Brief history: The patient is a 26 year old white female who has complained of back left buttock and leg pain consistent with a lumbar radiculopathy. She has failed medical management and was worked up with a lumbar MRI. This demonstrated patient had a herniated disc at L5-S1 left. I discussed the various treatment options with the patient including surgery. The patient has weighed the risks, benefits, and alternatives surgery and decided proceed with a discectomy.  Preoperative diagnosis: Left L5-S1 herniated disc, lumbar radiculopathy, lumbago  Postoperative diagnosis: The same  Procedure: Left L5-S1 Intervertebral discectomy using micro-dissection  Surgeon: Dr. Delma Officer  Asst.: Dr. Hilda Lias  Anesthesia: Gen. endotracheal  Estimated blood loss: 25 cc  Drains: None  Complications: None  Description of procedure: The patient was brought to the operating room by the anesthesia team. General endotracheal anesthesia was induced. The patient was turned to the prone position on the Wilson frame. The patient's lumbosacral region was then prepared with Betadine scrub and Betadine solution. Sterile drapes were applied.  I then injected the area to be incised with Marcaine with epinephrine solution. I then used a scalpel to make a linear midline incision over the L5-S1 intervertebral disc space. I then used electrocautery to perform a left sided subperiosteal dissection exposing the spinous process and lamina of L5 and S1. We obtained intraoperative radiograph to confirm our location. I then inserted the [facility] retractor for exposure.  We then brought the operative microscope into the field. Under its magnification and illumination we completed the microdissection. I used a high-speed drill to perform a laminotomy at L5 on the left. I then used a Kerrison punches to widen the laminotomy and removed the ligamentum flavum at L5-S1 on the left. We then used microdissection to free up the  thecal sac and the left S1 nerve root from the epidural tissue. I then used a Kerrison punch to perform a foraminotomy at about the left S1 nerve root. We then using the nerve root retractor to gently retract the thecal sac and the left S1 nerve root medially. This exposed the intervertebral disc. We identified the ruptured disc and remove it with the pituitary forceps. We performed a partial intervertebral discectomy using the pituitary forceps and the Epstein curettes  I then palpated along the ventral surface of the thecal sac and along exit route of the left L5 nerve root and noted that the neural structures were well decompressed. This completed the decompression.  We then obtained hemostasis using bipolar electrocautery. We irrigated the wound out with bacitracin solution. We then removed the retractor. We then reapproximated the patient's thoracolumbar fascia with interrupted #1 Vicryl suture. We then reapproximated the patient's subcutaneous tissue with interrupted 3-0 Vicryl suture. We then reapproximated patient's skin with Steri-Strips and benzoin. The was then coated with bacitracin ointment. The drapes were removed. The patient was subsequently returned to the supine position where they were extubated by the anesthesia team. The patient was then transported to the postanesthesia care unit in stable condition. All sponge instrument and needle counts were correct at the end of this case.

## 2012-07-06 NOTE — Progress Notes (Addendum)
Dr. Michelle Piper called about patient asking for Xanax and Hydrocodone 5-325 (gets nauseated if takes 10-325 on empty stomach-hoping 5-325 will not make her sick). Orders received.   Pain now 4 on 1-10 scale, down from 9 on 1-10 scale.

## 2012-07-06 NOTE — Transfer of Care (Signed)
Immediate Anesthesia Transfer of Care Note  Patient: Olivia Fuller  Procedure(s) Performed: Procedure(s) (LRB): LUMBAR LAMINECTOMY/DECOMPRESSION MICRODISCECTOMY 1 LEVEL (Left)  Patient Location: PACU  Anesthesia Type: General  Level of Consciousness: awake, alert  and oriented  Airway & Oxygen Therapy: Patient Spontanous Breathing and Patient connected to nasal cannula oxygen  Post-op Assessment: Report given to PACU RN and Post -op Vital signs reviewed and stable  Post vital signs: Reviewed and stable  Complications: No apparent anesthesia complications

## 2012-07-06 NOTE — Anesthesia Postprocedure Evaluation (Signed)
Anesthesia Post Note  Patient: Olivia Fuller  Procedure(s) Performed: Procedure(s) (LRB): LUMBAR LAMINECTOMY/DECOMPRESSION MICRODISCECTOMY 1 LEVEL (Left)  Anesthesia type: general  Patient location: PACU  Post pain: Pain level controlled  Post assessment: Patient's Cardiovascular Status Stable  Last Vitals:  Filed Vitals:   07/06/12 1551  BP: 106/56  Pulse: 93  Temp:   Resp: 20    Post vital signs: Reviewed and stable  Level of consciousness: sedated  Complications: No apparent anesthesia complications

## 2012-07-06 NOTE — Anesthesia Preprocedure Evaluation (Addendum)
Anesthesia Evaluation  Patient identified by MRN, date of birth, ID band Patient awake    Reviewed: Allergy & Precautions, H&P , NPO status , Patient's Chart, lab work & pertinent test results  Airway Mallampati: II TM Distance: >3 FB   Mouth opening: Limited Mouth Opening  Dental  (+) Teeth Intact and Dental Advisory Given   Pulmonary shortness of breath,   Hx of asthma.  "Asthma attack" yesterday resolved w/o inhaler        Cardiovascular negative cardio ROS      Neuro/Psych PSYCHIATRIC DISORDERS Anxiety Left leg pain to her toes negative neurological ROS     GI/Hepatic Neg liver ROS, GERD-  Medicated and Controlled,  Endo/Other  negative endocrine ROS  Renal/GU negative Renal ROS  negative genitourinary   Musculoskeletal   Abdominal   Peds  Hematology   Anesthesia Other Findings   Reproductive/Obstetrics Hasn't taken clomid in 1 month.  Denies pregnancy this day.                         Anesthesia Physical Anesthesia Plan  ASA: II  Anesthesia Plan: General   Post-op Pain Management:    Induction: Intravenous  Airway Management Planned: Oral ETT  Additional Equipment:   Intra-op Plan:   Post-operative Plan: Extubation in OR  Informed Consent: I have reviewed the patients History and Physical, chart, labs and discussed the procedure including the risks, benefits and alternatives for the proposed anesthesia with the patient or authorized representative who has indicated his/her understanding and acceptance.   Dental advisory given  Plan Discussed with: CRNA and Surgeon  Anesthesia Plan Comments:        Anesthesia Quick Evaluation

## 2012-07-06 NOTE — Anesthesia Procedure Notes (Signed)
Procedure Name: Intubation Date/Time: 07/06/2012 2:09 PM Performed by: Lenardo Westwood S Pre-anesthesia Checklist: Patient identified, Emergency Drugs available, Suction available, Patient being monitored and Timeout performed Patient Re-evaluated:Patient Re-evaluated prior to inductionOxygen Delivery Method: Circle system utilized Preoxygenation: Pre-oxygenation with 100% oxygen Intubation Type: IV induction Ventilation: Mask ventilation without difficulty Laryngoscope Size: Mac and 3 Grade View: Grade I Tube type: Oral Tube size: 7.5 mm Number of attempts: 1 Airway Equipment and Method: Stylet Placement Confirmation: ETT inserted through vocal cords under direct vision and breath sounds checked- equal and bilateral Secured at: 22 cm Tube secured with: Tape Dental Injury: Teeth and Oropharynx as per pre-operative assessment

## 2012-07-06 NOTE — Plan of Care (Signed)
Problem: Consults Goal: Diagnosis - Spinal Surgery Outcome: Completed/Met Date Met:  07/06/12 Lumbar Laminectomy (Complex)     

## 2012-07-06 NOTE — Progress Notes (Signed)
Consent had to be signed DOS due to MD orders not being signed at PAT.

## 2012-07-07 ENCOUNTER — Encounter (HOSPITAL_COMMUNITY): Payer: Self-pay | Admitting: Neurosurgery

## 2012-07-07 MED ORDER — OXYCODONE-ACETAMINOPHEN 10-325 MG PO TABS: 1.0000 | ORAL_TABLET | ORAL | Status: AC | PRN

## 2012-07-07 MED ORDER — CYCLOBENZAPRINE HCL 10 MG PO TABS: 10.0000 mg | ORAL_TABLET | Freq: Three times a day (TID) | ORAL | Status: AC | PRN

## 2012-07-07 MED ORDER — DSS 100 MG PO CAPS: 100.0000 mg | ORAL_CAPSULE | Freq: Two times a day (BID) | ORAL | Status: AC

## 2012-07-07 NOTE — Discharge Summary (Signed)
Physician Discharge Summary  Patient ID: Olivia Fuller MRN: [id number] DOB/AGE: 05/29/1986 26 y.o.  Admit date: 07/06/2012 Discharge date: 07/07/2012  Admission Diagnoses: Left L5-S1 herniated disc, lumbar radiculopathy, lumbago  Discharge Diagnoses: The same Active Problems:  * No active hospital problems. *    Discharged Condition: good  Hospital Course: I admitted the patient to [facility] on 07/06/2012. On that day I performed a left L5-S1 discectomy. The surgery went well. The patient's postoperative course was unremarkable and she was discharged home on postop day 1. The patient was given oral and written discharge instructions. All her questions were answered.  Consults: None Significant Diagnostic Studies: None Treatments: Left L5-S1 discectomy using microdissection. Discharge Exam: Blood pressure 80/55, pulse 84, temperature 98.2 F (36.8 C), temperature source Oral, resp. rate 14, last menstrual period 07/02/2012, SpO2 98.00%. The patient is alert and pleasant. Her strength is normal in her lower extremities.  Disposition: Home  Discharge Orders    Future Orders Please Complete By Expires   Diet - low sodium heart healthy      Increase activity slowly      Discharge instructions      Comments:   Call [phone] for a followup appointment.   Remove dressing in 48 hours      Call MD for:  temperature >100.4      Call MD for:  persistant nausea and vomiting      Call MD for:  severe uncontrolled pain      Call MD for:  redness, tenderness, or signs of infection (pain, swelling, redness, odor or green/yellow discharge around incision site)      Call MD for:  difficulty breathing, headache or visual disturbances      Call MD for:  hives      Call MD for:  persistant dizziness or light-headedness      Call MD for:  extreme fatigue        Medication List  As of 07/07/2012  7:29 AM   STOP taking these medications         HYDROcodone-acetaminophen 10-325 MG  per tablet         TAKE these medications         albuterol 108 (90 BASE) MCG/ACT inhaler   Commonly known as: PROVENTIL HFA;VENTOLIN HFA   Inhale 1-2 puffs into the lungs every 6 (six) hours as needed. As needed for shortness of breath.      ALPRAZolam 0.5 MG tablet   Commonly known as: XANAX   Take 0.5 mg by mouth every 8 (eight) hours as needed. As needed for muscle spasms.      budesonide-formoterol 80-4.5 MCG/ACT inhaler   Commonly known as: SYMBICORT   Inhale 2 puffs into the lungs 2 (two) times daily as needed. As needed for shortness of breath.      clomiPHENE 50 MG tablet   Commonly known as: CLOMID   Take 50 mg by mouth daily as needed. Take on days 3 through 7 of menstrual cycle.      cyclobenzaprine 10 MG tablet   Commonly known as: FLEXERIL   Take 1 tablet (10 mg total) by mouth 3 (three) times daily as needed for muscle spasms.      DSS 100 MG Caps   Take 100 mg by mouth 2 (two) times daily.      oxyCODONE-acetaminophen 10-325 MG per tablet   Commonly known as: PERCOCET   Take 1 tablet by mouth every 4 (four) hours as  needed for pain.      prenatal multivitamin Tabs   Take 1 tablet by mouth daily.             SignedCristi Loron 07/07/2012, 7:29 AM

## 2012-07-07 NOTE — Progress Notes (Signed)
Utilization review completed. Alin Hutchins, RN, BSN. 

## 2012-08-23 ENCOUNTER — Emergency Department (HOSPITAL_COMMUNITY)
Admission: EM | Admit: 2012-08-23 | Discharge: 2012-08-23 | Disposition: A | Discharge status: Home / Self Care | Payer: Self-pay | Attending: Emergency Medicine | Admitting: Emergency Medicine

## 2012-08-23 ENCOUNTER — Encounter (HOSPITAL_COMMUNITY): Payer: Self-pay | Admitting: Emergency Medicine

## 2012-08-23 DIAGNOSIS — J069 Acute upper respiratory infection, unspecified: Secondary | ICD-10-CM | POA: Insufficient documentation

## 2012-08-23 DIAGNOSIS — J45909 Unspecified asthma, uncomplicated: Secondary | ICD-10-CM | POA: Insufficient documentation

## 2012-08-23 DIAGNOSIS — Z888 Allergy status to other drugs, medicaments and biological substances status: Secondary | ICD-10-CM | POA: Insufficient documentation

## 2012-08-23 DIAGNOSIS — Z87891 Personal history of nicotine dependence: Secondary | ICD-10-CM | POA: Insufficient documentation

## 2012-08-23 MED ORDER — PSEUDOEPHEDRINE HCL 60 MG PO TABS: ORAL_TABLET | ORAL | Status: DC

## 2012-08-23 MED ORDER — SALINE NASAL SPRAY 0.65 % NA SOLN: 1.0000 | NASAL | Status: DC | PRN

## 2012-08-23 NOTE — ED Notes (Signed)
Patient with c/o sore throat, cough, congestion x 3 days.

## 2012-08-23 NOTE — ED Provider Notes (Signed)
History     CSN: [id number]  Arrival date & time 08/23/12  0844   First MD Initiated Contact with Patient 08/23/12 0912      Chief Complaint  Patient presents with  . Nasal Congestion  . Sore Throat    (Consider location/radiation/quality/duration/timing/severity/associated sxs/prior treatment) Patient is a 26 y.o. female presenting with pharyngitis. The history is provided by the patient.  Sore Throat This is a new problem. The current episode started in the past 7 days. The problem occurs constantly. The problem has been gradually worsening. Associated symptoms include congestion and coughing. Pertinent negatives include no abdominal pain, arthralgias, chest pain or neck pain. Nothing aggravates the symptoms. Treatments tried: OTC cough med. The treatment provided no relief.    Past Medical History  Diagnosis Date  . Asthma   . Scoliosis     Past Surgical History  Procedure Date  . No surgical history   . Lumbar laminectomy/decompression microdiscectomy 07/06/2012    Procedure: LUMBAR LAMINECTOMY/DECOMPRESSION MICRODISCECTOMY 1 LEVEL;  Surgeon: Cristi Loron, MD;  Location: [facility];  Service: Neurosurgery;  Laterality: Left;  LEFT Lumbar Five-Sacral One diskectomy    No family history on file.  History  Substance Use Topics  . Smoking status: Former Smoker -- 5 years    Quit date: 02/14/2012  . Smokeless tobacco: Not on file  . Alcohol Use: No    OB History    Grav Para Term Preterm Abortions TAB SAB Ect Mult Living                  Review of Systems  Constitutional: Negative for activity change.       All ROS Neg except as noted in HPI  HENT: Positive for congestion. Negative for nosebleeds and neck pain.   Eyes: Negative for photophobia and discharge.  Respiratory: Positive for cough. Negative for shortness of breath and wheezing.   Cardiovascular: Negative for chest pain and palpitations.  Gastrointestinal: Negative for abdominal pain and blood in  stool.  Genitourinary: Negative for dysuria, frequency and hematuria.  Musculoskeletal: Positive for back pain. Negative for arthralgias.  Skin: Negative.   Neurological: Negative for dizziness, seizures and speech difficulty.  Psychiatric/Behavioral: Negative for hallucinations and confusion.    Allergies  Tylenol  Home Medications   Current Outpatient Rx  Name Route Sig Dispense Refill  . ALBUTEROL SULFATE HFA 108 (90 BASE) MCG/ACT IN AERS Inhalation Inhale 1-2 puffs into the lungs every 6 (six) hours as needed. As needed for shortness of breath.    . ALPRAZOLAM 0.5 MG PO TABS Oral Take 0.5 mg by mouth every 8 (eight) hours as needed. As needed for muscle spasms.    . BUDESONIDE-FORMOTEROL FUMARATE 80-4.5 MCG/ACT IN AERO Inhalation Inhale 2 puffs into the lungs 2 (two) times daily as needed. As needed for shortness of breath.    Marland Kitchen CLOMIPHENE CITRATE 50 MG PO TABS Oral Take 50 mg by mouth daily as needed. Take on days 3 through 7 of menstrual cycle.    Marland Kitchen PRENATAL MULTIVITAMIN CH Oral Take 1 tablet by mouth daily.      BP 109/67  Pulse 93  Temp 98.6 F (37 C) (Oral)  Resp 18  Ht 5\' 7"  (1.702 m)  Wt 138 lb (62.596 kg)  BMI 21.61 kg/m2  SpO2 100%  LMP 07/26/2012  Physical Exam  Nursing note and vitals reviewed. Constitutional: She is oriented to person, place, and time. She appears well-developed and well-nourished.  Non-toxic appearance.  HENT:  Head: Normocephalic.  Right Ear: Tympanic membrane and external ear normal.  Left Ear: Tympanic membrane and external ear normal.       Nasal congestion.  Eyes: EOM and lids are normal. Pupils are equal, round, and reactive to light.  Neck: Normal range of motion. Neck supple. Carotid bruit is not present.  Cardiovascular: Normal rate, regular rhythm, normal heart sounds, intact distal pulses and normal pulses.   Pulmonary/Chest: Breath sounds normal. No respiratory distress.  Abdominal: Soft. Bowel sounds are normal. There is no  tenderness. There is no guarding.  Musculoskeletal: Normal range of motion.  Lymphadenopathy:       Head (right side): No submandibular adenopathy present.       Head (left side): No submandibular adenopathy present.    She has no cervical adenopathy.  Neurological: She is alert and oriented to person, place, and time. She has normal strength. No cranial nerve deficit or sensory deficit.  Skin: Skin is warm and dry.  Psychiatric: She has a normal mood and affect. Her speech is normal.    ED Course  Procedures (including critical care time)  Labs Reviewed - No data to display No results found. Pulse ox 100% on room air. WNL by my interpretation.  No diagnosis found.    MDM  I have reviewed nursing notes, vital signs, and all appropriate lab and imaging results for this patient. Pt with 3 days of congestion and cough. She has hx of asthma and is concerned that the cough, congestion and asthma may get worse. She also report that the last time she had this "tusseonex" knocked it out in 36 hours. No acute changes noted on exam or vital signs. Pt advised to increase fluids. Wash hands frequently. Sudafed and saline nasal spray for congestion. Tylenol for soreness if needed.       Kathie Dike, [place] 08/28/12 2029

## 2012-08-29 NOTE — ED Provider Notes (Signed)
Medical screening examination/treatment/procedure(s) were performed by non-physician practitioner and as supervising physician I was immediately available for consultation/collaboration.   Benny Lennert, MD 08/29/12 [phone]

## 2012-11-16 ENCOUNTER — Other Ambulatory Visit (HOSPITAL_COMMUNITY): Payer: Self-pay | Admitting: Neurosurgery

## 2012-11-16 DIAGNOSIS — M549 Dorsalgia, unspecified: Secondary | ICD-10-CM

## 2012-11-17 ENCOUNTER — Ambulatory Visit (HOSPITAL_COMMUNITY)
Admission: RE | Admit: 2012-11-17 | Discharge: 2012-11-17 | Disposition: A | Discharge status: Home / Self Care | Payer: Self-pay | Source: Ambulatory Visit | Attending: Neurosurgery | Admitting: Neurosurgery

## 2012-11-17 DIAGNOSIS — M549 Dorsalgia, unspecified: Secondary | ICD-10-CM

## 2012-11-17 DIAGNOSIS — M545 Low back pain, unspecified: Secondary | ICD-10-CM | POA: Insufficient documentation

## 2012-11-17 MED ORDER — GADOBENATE DIMEGLUMINE 529 MG/ML IV SOLN
14.0000 mL | Freq: Once | INTRAVENOUS | Status: AC | PRN
  Administered 2012-11-17: 14 mL via INTRAVENOUS

## 2012-12-06 ENCOUNTER — Emergency Department (HOSPITAL_COMMUNITY)
Admission: EM | Admit: 2012-12-06 | Discharge: 2012-12-06 | Disposition: A | Discharge status: Home / Self Care | Payer: Self-pay | Attending: Emergency Medicine | Admitting: Emergency Medicine

## 2012-12-06 ENCOUNTER — Encounter (HOSPITAL_COMMUNITY): Payer: Self-pay | Admitting: Emergency Medicine

## 2012-12-06 DIAGNOSIS — Z87891 Personal history of nicotine dependence: Secondary | ICD-10-CM | POA: Insufficient documentation

## 2012-12-06 DIAGNOSIS — J45909 Unspecified asthma, uncomplicated: Secondary | ICD-10-CM | POA: Insufficient documentation

## 2012-12-06 DIAGNOSIS — R112 Nausea with vomiting, unspecified: Secondary | ICD-10-CM | POA: Insufficient documentation

## 2012-12-06 DIAGNOSIS — Z79899 Other long term (current) drug therapy: Secondary | ICD-10-CM | POA: Insufficient documentation

## 2012-12-06 DIAGNOSIS — M4120 Other idiopathic scoliosis, site unspecified: Secondary | ICD-10-CM | POA: Insufficient documentation

## 2012-12-06 DIAGNOSIS — R197 Diarrhea, unspecified: Secondary | ICD-10-CM | POA: Insufficient documentation

## 2012-12-06 DIAGNOSIS — IMO0002 Reserved for concepts with insufficient information to code with codable children: Secondary | ICD-10-CM | POA: Insufficient documentation

## 2012-12-06 LAB — URINALYSIS, ROUTINE W REFLEX MICROSCOPIC
Glucose, UA: NEGATIVE mg/dL
Hgb urine dipstick: NEGATIVE
Specific Gravity, Urine: 1.02 (ref 1.005–1.030)

## 2012-12-06 MED ORDER — ONDANSETRON 4 MG PO TBDP: 4.0000 mg | ORAL_TABLET | Freq: Three times a day (TID) | ORAL | Status: DC | PRN

## 2012-12-06 MED ORDER — MORPHINE SULFATE 2 MG/ML IJ SOLN
2.0000 mg | Freq: Once | INTRAMUSCULAR | Status: AC
  Administered 2012-12-06: 2 mg via INTRAVENOUS
  Filled 2012-12-06: qty 1

## 2012-12-06 MED ORDER — SODIUM CHLORIDE 0.9 % IV BOLUS (SEPSIS)
1000.0000 mL | Freq: Once | INTRAVENOUS | Status: AC
  Administered 2012-12-06: 1000 mL via INTRAVENOUS

## 2012-12-06 MED ORDER — ONDANSETRON HCL 4 MG/2ML IJ SOLN
4.0000 mg | Freq: Once | INTRAMUSCULAR | Status: AC
  Administered 2012-12-06: 4 mg via INTRAVENOUS
  Filled 2012-12-06: qty 2

## 2012-12-06 NOTE — ED Notes (Signed)
Vomited "several  6-10 times since 10pm bright red blood and has had two diarrhea stools also bright red blood.   Currently being evaluated for a possible infection - had blood work drawn 12/27 at [facility] - ordered by Neurosurgeon Dr Lovell Sheehan to r/o infection.  Pt does not know the results

## 2012-12-06 NOTE — ED Notes (Signed)
Onset of vomiting and diarrhea last pm   States blood in emesis and stool

## 2012-12-06 NOTE — ED Provider Notes (Addendum)
History     CSN: [id number]  Arrival date & time 12/06/12  0548   First MD Initiated Contact with Patient 12/06/12 [phone]      Chief Complaint  Patient presents with  . Hematemesis    (Consider location/radiation/quality/duration/timing/severity/associated sxs/prior treatment) HPI Olivia Fuller is a 27 y.o. female who presents to the Emergency Department complaining of nausea, vomiting and diarrhea that began at 10 PM last night and has been continuous. Unable to keep down fluids or food. Has taken no medicines. Denies fever, cough, shortness of breath. Has seem blood streaks in both the emesis and diarrhea  PCP Dr. Lovell Sheehan. Past Medical History  Diagnosis Date  . Asthma   . Scoliosis     Past Surgical History  Procedure Date  . No surgical history   . Lumbar laminectomy/decompression microdiscectomy 07/06/2012    Procedure: LUMBAR LAMINECTOMY/DECOMPRESSION MICRODISCECTOMY 1 LEVEL;  Surgeon: Cristi Loron, MD;  Location: [facility];  Service: Neurosurgery;  Laterality: Left;  LEFT Lumbar Five-Sacral One diskectomy    No family history on file.  History  Substance Use Topics  . Smoking status: Former Smoker -- 5 years    Quit date: 02/14/2012  . Smokeless tobacco: Not on file  . Alcohol Use: No    OB History    Grav Para Term Preterm Abortions TAB SAB Ect Mult Living                  Review of Systems  Constitutional: Negative for fever.       10 Systems reviewed and are negative for acute change except as noted in the HPI.  HENT: Negative for congestion.   Eyes: Negative for discharge and redness.  Respiratory: Negative for cough and shortness of breath.   Cardiovascular: Negative for chest pain.  Gastrointestinal: Positive for nausea, vomiting and diarrhea. Negative for abdominal pain.  Musculoskeletal: Negative for back pain.  Skin: Negative for rash.  Neurological: Negative for syncope, numbness and headaches.  Psychiatric/Behavioral:       No behavior  change.    Allergies  Tylenol  Home Medications   Current Outpatient Rx  Name  Route  Sig  Dispense  Refill  . ALPRAZOLAM 0.5 MG PO TABS   Oral   Take 0.5 mg by mouth every 8 (eight) hours as needed. As needed for muscle spasms.         Marland Kitchen CLOMIPHENE CITRATE 50 MG PO TABS   Oral   Take 50 mg by mouth daily as needed. Take on days 3 through 7 of menstrual cycle.         . OXYCODONE-ACETAMINOPHEN 10-325 MG PO TABS   Oral   Take 1 tablet by mouth every 4 (four) hours as needed.         Marland Kitchen PRENATAL MULTIVITAMIN CH   Oral   Take 1 tablet by mouth daily.         . ALBUTEROL SULFATE HFA 108 (90 BASE) MCG/ACT IN AERS   Inhalation   Inhale 1-2 puffs into the lungs every 6 (six) hours as needed. As needed for shortness of breath.         . BUDESONIDE-FORMOTEROL FUMARATE 80-4.5 MCG/ACT IN AERO   Inhalation   Inhale 2 puffs into the lungs 2 (two) times daily as needed. As needed for shortness of breath.         Marland Kitchen PSEUDOEPHEDRINE HCL 60 MG PO TABS      1 po tid for congestion  30 tablet   0   . SALINE NASAL SPRAY 0.65 % NA SOLN   Nasal   Place 1 spray into the nose as needed for congestion.   30 mL   12     BP 114/51  Pulse 93  Temp 99.4 F (37.4 C) (Oral)  Resp 16  Ht 5\' 7"  (1.702 m)  Wt 148 lb (67.132 kg)  BMI 23.18 kg/m2  SpO2 97%  LMP 12/05/2012  Physical Exam  Nursing note and vitals reviewed. Constitutional: She appears well-developed and well-nourished.       Awake, alert, nontoxic appearance.  HENT:  Head: Atraumatic.  Eyes: Right eye exhibits no discharge. Left eye exhibits no discharge.  Neck: Neck supple.  Cardiovascular: Normal heart sounds.   Pulmonary/Chest: Effort normal and breath sounds normal. She exhibits no tenderness.  Abdominal: Soft. There is no tenderness. There is no rebound.  Musculoskeletal: She exhibits no tenderness.       Baseline ROM, no obvious new focal weakness.  Neurological:       Mental status and motor  strength appears baseline for patient and situation.  Skin: No rash noted.  Psychiatric: She has a normal mood and affect.    ED Course  Procedures (including critical care time)  Results for orders placed during the hospital encounter of 12/06/12  URINALYSIS, ROUTINE W REFLEX MICROSCOPIC      Component Value Range   Color, Urine YELLOW  YELLOW   APPearance CLEAR  CLEAR   Specific Gravity, Urine 1.020  1.005 - 1.030   pH 7.0  5.0 - 8.0   Glucose, UA NEGATIVE  NEGATIVE mg/dL   Hgb urine dipstick NEGATIVE  NEGATIVE   Bilirubin Urine NEGATIVE  NEGATIVE   Ketones, ur NEGATIVE  NEGATIVE mg/dL   Protein, ur NEGATIVE  NEGATIVE mg/dL   Urobilinogen, UA 0.2  0.0 - 1.0 mg/dL   Nitrite NEGATIVE  NEGATIVE   Leukocytes, UA NEGATIVE  NEGATIVE       MDM  Patient with nausea, vomiting, diarrhea illness that began last night.Given IVF, antiemetic and analgesic with improvement. Pt stable in ED with no significant deterioration in condition.The patient appears reasonably screened and/or stabilized for discharge and I doubt any other medical condition or other [facility] requiring further screening, evaluation, or treatment in the ED at this time prior to discharge.  MDM Reviewed: nursing note and vitals Interpretation: labs           Nicoletta Dress. Colon Branch, MD 12/06/12 1610  Nicoletta Dress. Colon Branch, MD 12/06/12 [id number]  Nicoletta Dress. Colon Branch, MD 12/06/12 [phone]

## 2013-01-31 ENCOUNTER — Emergency Department (HOSPITAL_COMMUNITY)
Admission: EM | Admit: 2013-01-31 | Discharge: 2013-01-31 | Disposition: A | Discharge status: Home / Self Care | Payer: Self-pay | Attending: Emergency Medicine | Admitting: Emergency Medicine

## 2013-01-31 ENCOUNTER — Encounter (HOSPITAL_COMMUNITY): Payer: Self-pay | Admitting: Emergency Medicine

## 2013-01-31 DIAGNOSIS — Z79899 Other long term (current) drug therapy: Secondary | ICD-10-CM | POA: Insufficient documentation

## 2013-01-31 DIAGNOSIS — J45909 Unspecified asthma, uncomplicated: Secondary | ICD-10-CM | POA: Insufficient documentation

## 2013-01-31 DIAGNOSIS — J029 Acute pharyngitis, unspecified: Secondary | ICD-10-CM | POA: Insufficient documentation

## 2013-01-31 DIAGNOSIS — Z8739 Personal history of other diseases of the musculoskeletal system and connective tissue: Secondary | ICD-10-CM | POA: Insufficient documentation

## 2013-01-31 DIAGNOSIS — Z87891 Personal history of nicotine dependence: Secondary | ICD-10-CM | POA: Insufficient documentation

## 2013-01-31 LAB — RAPID STREP SCREEN (MED CTR MEBANE ONLY): Streptococcus, Group A Screen (Direct): NEGATIVE

## 2013-01-31 NOTE — ED Provider Notes (Signed)
History     CSN: [id number]  Arrival date & time 01/31/13  0310   First MD Initiated Contact with Patient 01/31/13 0335      Chief Complaint  Patient presents with  . Sore Throat    (Consider location/radiation/quality/duration/timing/severity/associated sxs/prior treatment) HPI Olivia Fuller is a 27 y.o. female who presents to the Emergency Department complaining of a sore throat that began yesterday. It is difficult to swallow and feels like "a tennis ball in my throat". She denies fever, chills, nasal congestion.  PCP Dr. Lovell Sheehan  Past Medical History  Diagnosis Date  . Asthma   . Scoliosis     Past Surgical History  Procedure Laterality Date  . No surgical history    . Lumbar laminectomy/decompression microdiscectomy  07/06/2012    Procedure: LUMBAR LAMINECTOMY/DECOMPRESSION MICRODISCECTOMY 1 LEVEL;  Surgeon: Cristi Loron, MD;  Location: [facility];  Service: Neurosurgery;  Laterality: Left;  LEFT Lumbar Five-Sacral One diskectomy    No family history on file.  History  Substance Use Topics  . Smoking status: Former Smoker -- 5 years    Quit date: 02/14/2012  . Smokeless tobacco: Not on file  . Alcohol Use: No    OB History   Grav Para Term Preterm Abortions TAB SAB Ect Mult Living                  Review of Systems  Constitutional: Negative for fever.       10 Systems reviewed and are negative for acute change except as noted in the HPI.  HENT: Positive for sore throat. Negative for congestion.   Eyes: Negative for discharge and redness.  Respiratory: Negative for cough and shortness of breath.   Cardiovascular: Negative for chest pain.  Gastrointestinal: Negative for vomiting and abdominal pain.  Musculoskeletal: Negative for back pain.  Skin: Negative for rash.  Neurological: Negative for syncope, numbness and headaches.  Psychiatric/Behavioral:       No behavior change.    Allergies  Tylenol  Home Medications   Current Outpatient Rx   Name  Route  Sig  Dispense  Refill  . albuterol (PROVENTIL HFA;VENTOLIN HFA) 108 (90 BASE) MCG/ACT inhaler   Inhalation   Inhale 1-2 puffs into the lungs every 6 (six) hours as needed. As needed for shortness of breath.         . ALPRAZolam (XANAX) 0.5 MG tablet   Oral   Take 0.5 mg by mouth every 8 (eight) hours as needed. As needed for muscle spasms.         . budesonide-formoterol (SYMBICORT) 80-4.5 MCG/ACT inhaler   Inhalation   Inhale 2 puffs into the lungs 2 (two) times daily as needed. As needed for shortness of breath.         . clomiPHENE (CLOMID) 50 MG tablet   Oral   Take 50 mg by mouth daily as needed. Take on days 3 through 7 of menstrual cycle.         . ondansetron (ZOFRAN ODT) 4 MG disintegrating tablet   Oral   Take 1 tablet (4 mg total) by mouth every 8 (eight) hours as needed for nausea.   20 tablet   0   . oxyCODONE-acetaminophen (PERCOCET) 10-325 MG per tablet   Oral   Take 1 tablet by mouth every 4 (four) hours as needed.         . Prenatal Vit-Fe Fumarate-FA (PRENATAL MULTIVITAMIN) TABS   Oral   Take 1 tablet by  mouth daily.         . pseudoephedrine (SUDAFED) 60 MG tablet      1 po tid for congestion   30 tablet   0   . sodium chloride (OCEAN NASAL SPRAY) 0.65 % nasal spray   Nasal   Place 1 spray into the nose as needed for congestion.   30 mL   12     BP 102/63  Pulse 98  Temp(Src) 98.7 F (37.1 C) (Oral)  Resp 18  Ht 5\' 7"  (1.702 m)  Wt 160 lb (72.576 kg)  BMI 25.05 kg/m2  SpO2 98%  LMP 01/06/2013  Physical Exam  Nursing note and vitals reviewed. Constitutional:  Awake, alert, nontoxic appearance.  HENT:  Head: Atraumatic.  Right Ear: External ear normal.  Left Ear: External ear normal.  Nose: Nose normal.  Mouth/Throat: Oropharynx is clear and moist.  Eyes: Right eye exhibits no discharge. Left eye exhibits no discharge.  Neck: Neck supple.  Cardiovascular: Normal rate.   Pulmonary/Chest: Effort normal and  breath sounds normal. She exhibits no tenderness.  Abdominal: Soft. There is no tenderness. There is no rebound.  Musculoskeletal: She exhibits no tenderness.  Baseline ROM, no obvious new focal weakness.  Neurological:  Mental status and motor strength appears baseline for patient and situation.  Skin: No rash noted.  Psychiatric: She has a normal mood and affect.    ED Course  Procedures (including critical care time)  Results for orders placed during the hospital encounter of 01/31/13  RAPID STREP SCREEN      Result Value Range   Streptococcus, Group A Screen (Direct) NEGATIVE  NEGATIVE     MDM  Patient with sore throat since yesterday. Strep test is negative. Throat without erythema. Reviewed results with patient.Pt stable in ED with no significant deterioration in condition.The patient appears reasonably screened and/or stabilized for discharge and I doubt any other medical condition or other [facility] requiring further screening, evaluation, or treatment in the ED at this time prior to discharge.  MDM Reviewed: nursing note and vitals Interpretation: labs           Nicoletta Dress. Colon Branch, MD 01/31/13 [id number]

## 2013-01-31 NOTE — ED Notes (Signed)
Pt c/o sore throat x2days. States "it feels like there is a tennis ball in my throat"

## 2013-02-02 ENCOUNTER — Inpatient Hospital Stay (HOSPITAL_COMMUNITY)
Admission: EM | Admit: 2013-02-02 | Discharge: 2013-02-03 | DRG: 155 | Disposition: A | Discharge status: Home / Self Care | Payer: MEDICAID | Attending: Family Medicine | Admitting: Family Medicine

## 2013-02-02 ENCOUNTER — Emergency Department (HOSPITAL_COMMUNITY): Payer: Self-pay

## 2013-02-02 ENCOUNTER — Encounter (HOSPITAL_COMMUNITY): Payer: Self-pay | Admitting: *Deleted

## 2013-02-02 DIAGNOSIS — J45909 Unspecified asthma, uncomplicated: Secondary | ICD-10-CM | POA: Diagnosis present

## 2013-02-02 DIAGNOSIS — M4120 Other idiopathic scoliosis, site unspecified: Secondary | ICD-10-CM | POA: Diagnosis present

## 2013-02-02 DIAGNOSIS — L500 Allergic urticaria: Secondary | ICD-10-CM | POA: Diagnosis not present

## 2013-02-02 DIAGNOSIS — R1310 Dysphagia, unspecified: Secondary | ICD-10-CM | POA: Diagnosis present

## 2013-02-02 DIAGNOSIS — Z87891 Personal history of nicotine dependence: Secondary | ICD-10-CM

## 2013-02-02 DIAGNOSIS — J387 Other diseases of larynx: Secondary | ICD-10-CM | POA: Diagnosis present

## 2013-02-02 DIAGNOSIS — Z23 Encounter for immunization: Secondary | ICD-10-CM

## 2013-02-02 DIAGNOSIS — T450X5A Adverse effect of antiallergic and antiemetic drugs, initial encounter: Secondary | ICD-10-CM | POA: Diagnosis not present

## 2013-02-02 DIAGNOSIS — J0510 Acute epiglottitis without obstruction: Secondary | ICD-10-CM | POA: Diagnosis present

## 2013-02-02 DIAGNOSIS — R0602 Shortness of breath: Secondary | ICD-10-CM | POA: Diagnosis present

## 2013-02-02 DIAGNOSIS — J383 Other diseases of vocal cords: Principal | ICD-10-CM | POA: Diagnosis present

## 2013-02-02 LAB — CBC WITH DIFFERENTIAL/PLATELET
Eosinophils Absolute: 0.1 10*3/uL (ref 0.0–0.7)
HCT: 42.3 % (ref 36.0–46.0)
Hemoglobin: 15 g/dL (ref 12.0–15.0)
MCHC: 35.5 g/dL (ref 30.0–36.0)
MCV: 95.5 fL (ref 78.0–100.0)
Neutro Abs: 5.1 10*3/uL (ref 1.7–7.7)
RDW: 12 % (ref 11.5–15.5)

## 2013-02-02 LAB — BASIC METABOLIC PANEL
GFR calc Af Amer: >90 mL/min (ref >90)
GFR calc non Af Amer: >90 mL/min (ref >90)
Glucose, Bld: 77 mg/dL (ref 70–99)
Potassium: 3.8 mEq/L (ref 3.5–5.1)
Sodium: 139 mEq/L (ref 135–145)

## 2013-02-02 LAB — POCT PREGNANCY, URINE: Preg Test, Ur: NEGATIVE

## 2013-02-02 LAB — MRSA PCR SCREENING: MRSA by PCR: NEGATIVE

## 2013-02-02 MED ORDER — OXYCODONE HCL 5 MG PO TABS
10.0000 mg | ORAL_TABLET | ORAL | Status: DC | PRN
  Administered 2013-02-02 – 2013-02-03 (×2): 10 mg via ORAL
  Filled 2013-02-02 (×2): qty 2

## 2013-02-02 MED ORDER — ONDANSETRON HCL 4 MG/2ML IJ SOLN
4.0000 mg | Freq: Once | INTRAMUSCULAR | Status: AC
  Administered 2013-02-02: 4 mg via INTRAVENOUS
  Filled 2013-02-02: qty 2

## 2013-02-02 MED ORDER — PNEUMOCOCCAL VAC POLYVALENT 25 MCG/0.5ML IJ INJ
0.5000 mL | INJECTION | INTRAMUSCULAR | Status: AC
  Administered 2013-02-03: 0.5 mL via INTRAMUSCULAR
  Filled 2013-02-02: qty 0.5

## 2013-02-02 MED ORDER — DEXTROSE 5 % IV SOLN
1.0000 g | Freq: Two times a day (BID) | INTRAVENOUS | Status: AC
  Administered 2013-02-03: 1 g via INTRAVENOUS
  Filled 2013-02-02: qty 10

## 2013-02-02 MED ORDER — DEXTROSE 5 % IV SOLN: 1.0000 g | Freq: Once | INTRAVENOUS | Status: AC

## 2013-02-02 MED ORDER — ONDANSETRON HCL 4 MG/2ML IJ SOLN: 4.0000 mg | Freq: Three times a day (TID) | INTRAMUSCULAR | Status: AC | PRN

## 2013-02-02 MED ORDER — DEXTROSE 5 % IV SOLN
INTRAVENOUS | Status: AC
  Administered 2013-02-02: 1 g via INTRAVENOUS
  Filled 2013-02-02: qty 10

## 2013-02-02 MED ORDER — DEXTROSE 5 % IV SOLN: 1.0000 g | Freq: Once | INTRAVENOUS | Status: DC

## 2013-02-02 MED ORDER — INFLUENZA VIRUS VACC SPLIT PF IM SUSP
0.5000 mL | INTRAMUSCULAR | Status: AC
  Administered 2013-02-03: 0.5 mL via INTRAMUSCULAR
  Filled 2013-02-02: qty 0.5

## 2013-02-02 MED ORDER — IOHEXOL 300 MG/ML  SOLN
80.0000 mL | Freq: Once | INTRAMUSCULAR | Status: AC | PRN
  Administered 2013-02-02: 80 mL via INTRAVENOUS

## 2013-02-02 MED ORDER — MORPHINE SULFATE 4 MG/ML IJ SOLN
4.0000 mg | Freq: Once | INTRAMUSCULAR | Status: AC
  Administered 2013-02-02: 4 mg via INTRAVENOUS
  Filled 2013-02-02: qty 1

## 2013-02-02 MED ORDER — ALPRAZOLAM 0.5 MG PO TABS
0.5000 mg | ORAL_TABLET | Freq: Three times a day (TID) | ORAL | Status: DC | PRN
  Administered 2013-02-02: 0.5 mg via ORAL
  Filled 2013-02-02: qty 1

## 2013-02-02 NOTE — ED Provider Notes (Signed)
History     CSN: [id number]  Arrival date & time 02/02/13  1045   First MD Initiated Contact with Patient 02/02/13 1118      Chief Complaint  Patient presents with  . Sore Throat    (Consider location/radiation/quality/duration/timing/severity/associated sxs/prior treatment) HPI Comments: Olivia Fuller is a 27 y.o. Female presenting with a sore throat and increased swelling and dysphagia which started 3 days ago.  She was seen here 2 days ago at which time her strep test was negative.  She has worsening symptoms despite attempts to use warm salt gargles.  She has been increasingly unable to swallow anything including pain relievers secondary to pain,  But also due to an increased sensation of swelling in her throat, stating "it feels like a tennis ball" in my throat.  She denies shortness of breath and has had no documented fevers.  She has had no PO intake in the past 24 hours,  Despite trying to eat soup last night,  Was unable to swallow it.  Her past medical history is significant for asthma which has been quiescent,  Denying any wheezing currently.     The history is provided by the patient.    Past Medical History  Diagnosis Date  . Asthma   . Scoliosis     Past Surgical History  Procedure Laterality Date  . No surgical history    . Lumbar laminectomy/decompression microdiscectomy  07/06/2012    Procedure: LUMBAR LAMINECTOMY/DECOMPRESSION MICRODISCECTOMY 1 LEVEL;  Surgeon: Cristi Loron, MD;  Location: [facility];  Service: Neurosurgery;  Laterality: Left;  LEFT Lumbar Five-Sacral One diskectomy  . Back surgery      History reviewed. No pertinent family history.  History  Substance Use Topics  . Smoking status: Former Smoker -- 5 years    Quit date: 02/14/2012  . Smokeless tobacco: Not on file  . Alcohol Use: No    OB History   Grav Para Term Preterm Abortions TAB SAB Ect Mult Living                  Review of Systems  Constitutional: Negative for  fever and chills.  HENT: Positive for sore throat, trouble swallowing and voice change. Negative for ear pain, congestion, rhinorrhea, drooling, neck pain and postnasal drip.   Eyes: Negative.   Respiratory: Negative for chest tightness, shortness of breath, wheezing and stridor.   Cardiovascular: Negative for chest pain.  Gastrointestinal: Negative for nausea and abdominal pain.  Genitourinary: Negative.   Musculoskeletal: Negative for joint swelling and arthralgias.  Skin: Negative.  Negative for rash and wound.  Neurological: Negative for dizziness, weakness, light-headedness, numbness and headaches.  Psychiatric/Behavioral: Negative.     Allergies  Tylenol  Home Medications   Current Outpatient Rx  Name  Route  Sig  Dispense  Refill  . ALPRAZolam (XANAX) 0.5 MG tablet   Oral   Take 0.5 mg by mouth every 8 (eight) hours as needed. As needed for muscle spasms.         Marland Kitchen HYDROcodone-acetaminophen (NORCO) 10-325 MG per tablet   Oral   Take 1 tablet by mouth every 4 (four) hours as needed for pain.         . Oxycodone HCl 10 MG TABS   Oral   Take 10 mg by mouth every 4 (four) hours as needed (pain).         . Prenatal Vit-Fe Fumarate-FA (PRENATAL MULTIVITAMIN) TABS   Oral   Take 1 tablet  by mouth daily.           BP 110/73  Pulse 117  Temp(Src) 98.3 F (36.8 C) (Oral)  Resp 20  Ht 5\' 7"  (1.702 m)  Wt 160 lb (72.576 kg)  BMI 25.05 kg/m2  SpO2 99%  LMP 01/06/2013  Physical Exam  Nursing note and vitals reviewed. Constitutional: She appears well-developed and well-nourished.  HENT:  Head: Normocephalic and atraumatic.  Right Ear: Tympanic membrane normal.  Left Ear: Tympanic membrane normal.  Nose: No mucosal edema or rhinorrhea.  Mouth/Throat: No edematous. Posterior oropharyngeal erythema present. No oropharyngeal exudate, posterior oropharyngeal edema or tonsillar abscesses.  Voice with muffled character, family at bedside confirms change in voice  quality.  Eyes: Conjunctivae are normal.  Neck: Normal range of motion. Neck supple. No thyromegaly present.  Cardiovascular: Normal rate, regular rhythm, normal heart sounds and intact distal pulses.   Pulmonary/Chest: Effort normal and breath sounds normal. She has no wheezes.  Abdominal: Soft. Bowel sounds are normal. There is no tenderness.  Musculoskeletal: Normal range of motion.  Lymphadenopathy:    She has cervical adenopathy.  Neurological: She is alert.  Skin: Skin is warm and dry.  Psychiatric: She has a normal mood and affect.    ED Course  Procedures (including critical care time)  Labs Reviewed  CBC WITH DIFFERENTIAL  BASIC METABOLIC PANEL  POCT PREGNANCY, URINE   Ct Soft Tissue Neck W Contrast  02/02/2013  *RADIOLOGY REPORT*  Clinical Data: Feels like tennis ball in throat for past week. Difficulty swallowing and eating.  CT NECK WITH CONTRAST  Technique:  Multidetector CT imaging of the neck was performed with intravenous contrast.  Contrast: 80mL OMNIPAQUE IOHEXOL 300 MG/ML  SOLN  Comparison: 11/06/2010 plain film examination of the neck.  Findings: Within the vallecula, spanning between the inferiorly located lingual tonsillar tissue and the epiglottis is a low density collection measuring 2.4 x 1.1 x 1.3 cm and greater to the right of midline.  Epiglottis appears slightly thickened.  It may be that this represents an infected vallecular cyst, inflammation of the lingual tonsillar tissue with secondary involvement of the epiglottis or primary epiglottis inflammation with secondary involvement of the lingual tonsillar tissue.  The epiglottis appears thicker than seen on the prior plain film exam.  Symmetric prominent appearance of the palatine tonsils.  No parapharyngeal inflammation.  Cervical adenopathy greater on the left with left level II lymph nodes measuring 1.4 x 1.1 cm transverse dimension spanning over 3.3 cm.  Heterogeneous appearance of thyroid gland with scattered  small nodules largest on the right measures up to 1.6 cm.  This can be evaluated with elective thyroid ultrasound.  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  Caries most notable posterior upper left molar.  Kyphosis centered at the C4-5 level may be related head position or muscle spasm.  IMPRESSION:  Within the vallecula, spanning between the inferiorly located lingual tonsillar tissue and the epiglottis is a low density collection measuring 2.4 x 1.1 x 1.3 cm and greater to the right of midline.  Epiglottis appears slightly thickened.  It may be that this represents an infected vallecular cyst, inflammation of the lingual tonsillar tissue with secondary involvement of the epiglottis or primary epiglottis inflammation with secondary involvement of the lingual tonsillar tissue.  The epiglottis appears thicker than seen on the prior plain film exam.  Cervical adenopathy greater on the left with left level II lymph nodes measuring 1.4 x 1.1 cm transverse dimension spanning over 3.3  cm.  Heterogeneous appearance of thyroid gland with scattered small nodules largest on the right measures up to 1.6 cm.  This can be evaluated with elective thyroid ultrasound.  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  Caries most notable posterior upper left molar.  Kyphosis centered at the C4-5 level may be related head position or muscle spasm.   Critical Value/emergent results were called by telephone at the time of interpretation on 01/25/2013 at 12:52 p.m. to Dr. Hyacinth Meeker, who verbally acknowledged these results.   Original Report Authenticated By: Lacy Duverney, M.D.      1. Vallecular mass       MDM  Ct results reviewed.  Discussed case with Dr Hyacinth Meeker who also saw pt. Plan for admission with consult by ENT.  IV abx given in ed.        Burgess Amor, PA 02/02/13 1425  Burgess Amor, [place] 02/02/13 1451

## 2013-02-02 NOTE — ED Notes (Signed)
Sore throat, says she cannot eat.

## 2013-02-02 NOTE — ED Provider Notes (Signed)
Pt has hx of sore throat and increasing difficulty with swallowing and some difficulty with braething - has gradually gotten worse over 7 days.  No fever, occaional cough.  Sx are moderate to severe.  On exam she appears comfortable with only a slight change in her voice. There is no increased work of breathing, she is tolerating her own secretions.  CT scan shows that she has a significant swelling and inflammatory process that is occurring in her vallecula which is causing her to feel uncomfortable. I have discussed her care with the hospital internal medicine physician Dr. Sudie Bailey who will admit her to the hospital. I have also discussed her care with the ear nose and throat physician who agrees with third-generation cephalosporin coverage in transfer to [facility] screening examination/treatment/procedure(s) were conducted as a shared visit with non-physician practitioner(s) and myself.  I personally evaluated the patient during the encounter    Vida Roller, MD 02/02/13 1454

## 2013-02-02 NOTE — ED Notes (Signed)
Report given to ICU RN

## 2013-02-02 NOTE — Progress Notes (Signed)
Per Dr. Sudie Bailey he is not going to start VTE prophylaxis at this time and will re-evaluate tomorrow.

## 2013-02-03 MED ORDER — ONDANSETRON HCL 4 MG/2ML IJ SOLN
4.0000 mg | Freq: Four times a day (QID) | INTRAMUSCULAR | Status: DC | PRN
  Administered 2013-02-03: 4 mg via INTRAVENOUS
  Filled 2013-02-03: qty 2

## 2013-02-03 MED ORDER — DIPHENHYDRAMINE HCL 50 MG/ML IJ SOLN
25.0000 mg | Freq: Once | INTRAMUSCULAR | Status: AC
  Administered 2013-02-03: 25 mg via INTRAVENOUS
  Filled 2013-02-03: qty 1

## 2013-02-03 MED ORDER — CEFUROXIME AXETIL 500 MG PO TABS: 500.0000 mg | ORAL_TABLET | Freq: Two times a day (BID) | ORAL | Status: DC

## 2013-02-03 NOTE — Progress Notes (Signed)
NOTIFIED DR. MCINNIS OF THE PATIENT C/O CONTINUANCE PROBLEMS WITH SWALLOWING, PARTICULARLY WITH HER CLEAR LIQUID TRAY THIS AM.  VOICED TO HIM THAT SHE HAD TOLD DR KNOWLTON THIS AM THAT SHE FELT BETTER AND SO HE HAD DISCHARGED HER THIS AM.  DR. Renard Matter STATED TO CONTINUE TO MONITOR HER UNTIL THIS AFTERNOON IN THE ICU AND CALL HIM BACK IF SHE CONTINUES TO HAVE PROBLEMS.  OTHERWISE TO PROCEED WITH DISCHARGE.  I WILL CONTINUE TO MONITOR.  I WILL DISCUSS WITH THE PATIENT.

## 2013-02-03 NOTE — Progress Notes (Signed)
GAVE PATIENT PAIN MEDICATION. NO COMPLAINTS WITH SWALLOWING.

## 2013-02-03 NOTE — Progress Notes (Signed)
Approximately 1135 am I gave the patient some Zofran due to her c/o nausea with medications.  This medication was flushed in with NS per protocol.  The patient immediately complained of inching and large hives began to develop on her arm. There was no increase in the swelling in her throat and no changes for worse according to the patient. The Medication was flushed with additional normal saline and then the line was d/c'd.  The patient had voiced that she previously had this problem, but due there throat was unable to voice to the staff previously.  The MD was notified of the incident and new orders were given and followed.  Pt also requested to try some soft foods instead of the liquid.    I did have some discussion with her about her diagnosis.  I voiced to her that due her throat being swollen that she probably was going to have some pain with swallowing.  That most likely she would need time on antibiotics for the swelling to go down and then it may be less pain.  She verbalized understanding. She inquires if she was going to be able to go home today and I voiced to her after lunch if she continued to do well.

## 2013-02-03 NOTE — H&P (Signed)
NAMEMarland Kitchen  NOZOMI, METTLER NO.:  [id number]  MEDICAL RECORD NO.:  [id number]  LOCATION:  [facility]                         FACILITY:  [facility]  PHYSICIAN:  Mila Homer. Sudie Bailey, M.D.DATE OF BIRTH:  07/09/1986  DATE OF ADMISSION:  02/02/2013 DATE OF DISCHARGE:  LH                             HISTORY & PHYSICAL   SUBJECTIVE:  A 27 year old who developed some shortness of breath today presented to the emergency room.  She has also had a sore throat and difficulty swallowing.  Started really about 7 days ago but gradually getting worse.  She has generally been healthy.  I have seen her in the office 1 time prior to this.  Medical history is positive for asthma and scoliosis but she also has diagnoses of atypical chest pain, dyspnea and allergic rhinitis.  CURRENT MEDICATIONS: 1. Alprazolam 0.5 mg q.8 hours for muscle spasms. 2. Hydrocodone/APAP 10/325 for pain. 3. Oxycodone 10 mg q.4 hours for pain. 4. Prenatal vitamins.  She has had minimal cough.  She has had no chest pressure or chest tightness with this.  Her significant other has been sick for 2 weeks with some type of infection, probably pulmonary.  PHYSICAL EXAMINATION:  GENERAL:  Admission exam showed a 27 year old young woman who appeared to be in no acute distress. VITAL SIGNS:  Temperature is 98.4, pulse 79, respiratory rate 20, blood pressure 119/68, O2 saturation 99%. HEENT:  Her pharynx was normal.  There were tender anterior cervical nodes. LUNGS:  The lungs appear clear throughout.  She is moving air well. HEART:  Her heart had a regular rhythm and a rate of 80. ABDOMEN:  Soft, without organomegaly or mass or tenderness.  There was no edema of the ankles. NEUROLOGICAL:  Her speech was normal.  Blood tests showed a white cell count 8200 with a normal diff. Hemoglobin 15.0 and normal BMP.  Pregnancy test was negative.  Group A strep screen was negative.  CT of the soft tissue of the neck with contrast  showed cervical adenopathy greater on the left.  The left level 2 lymph node measuring 1.4 x 1.1 cm as well as a heterogeneous appearance of the thyroid gland with small nodules.  She had a low density collection between the lingual tonsillar tissue and epiglottis measuring 2.4 x 1.1 x 1.3 cm with slight thickening of the epiglottis.  This was felt to represent an infected vallecular cyst, inflammation of the lingual tonsil tissue, secondary involvement of the epiglottis or primary epiglottis inflammation or secondary involvement of the lingual tonsil tissue.  She also had dental caries, most notably affecting upper left molar and kyphosis at C4-5 that was felt to be possibly position related.  ADMISSION DIAGNOSES: 1. Question of early epiglottitis. 2. 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 3. Chronic anxiety. 4. Chronic pain, question etiology.  PLAN:  She is admitted to the ICU step-down to have her on a monitor and observe overnight.  Her CT scan was reviewed by an ENT doctor in [place].  She will be on ceftriaxone IV for coverage against H flu and hopefully in re-evaluation be improved in the morning and be  able to be discharged home at that time on H flu coverage.     Mila Homer. Sudie Bailey, M.D.     SDK/MEDQ  D:  02/02/2013  T:  02/03/2013  Job:  [id number]

## 2013-02-03 NOTE — ED Provider Notes (Signed)
Medical screening examination/treatment/procedure(s) were conducted as a shared visit with non-physician practitioner(s) and myself.  I personally evaluated the patient during the encounter  Please see my separate respective documentation pertaining to this patient encounter   Vida Roller, MD 02/03/13 [phone]

## 2013-02-03 NOTE — Progress Notes (Signed)
Pt discharged with instructions, prescriptions and care notes.  She verbalized understanding.  Today she has taken her pills and at all her mash potatoes without any more difficulty as she was when she spoke with Dr. Sudie Bailey according to the patient she actually is feeling better.  I voiced to her the importance of coming back to the ED for worsening problems with the throat otherwise to keep her previously scheduled appointment for 02/09/13.  Pt is ready to be discharged without further complaints from allergic reaction.  She is currently stable.

## 2013-02-03 NOTE — Discharge Summary (Cosign Needed)
NAMEMarland Kitchen  Olivia Fuller, Olivia Fuller NO.:  [id number]  MEDICAL RECORD NO.:  [id number]  LOCATION:  [facility]                         FACILITY:  [facility]  PHYSICIAN:  Mila Homer. Sudie Bailey, M.D.DATE OF BIRTH:  [birth year]  DATE OF ADMISSION:  02/02/2013 DATE OF DISCHARGE:  LH                              DISCHARGE SUMMARY   SUBJECTIVE:  She feels a good deal better today.  She is breathing better.  She feels ready to go home.  She was admitted yesterday because of some dyspnea and turned out she had what appeared to be either inflammation of lingual tonsil and secondary involvement of epiglottis or primary epiglottis inflammation with secondary involvement of the lingula tonsil tissue.  It was also felt that this might have been an infected vallecular cyst.  She has also had blood tests which showed a normal white cell count of 8200, normal BMP and she also had a negative pregnancy test.  Her MRSA by PCR was negative.  She was admitted to the hospital on IV fluids.  She was in the ICU for close observation by nursing.  She was started on ceftriaxone a gram IV. Within 12 hours of this she really was much improved and ready for discharge home.  FINAL DISCHARGE DIAGNOSES: 1. Infection of the vocal cords. 2. History of asthma.  DISCHARGE MEDICATIONS: 1. She is discharged home on her usual medications which includes     alprazolam 0.5 mg q.8 hours p.r.n. muscle spasms. 2. Hydrocodone/APAP 10/325 q.4 hours for pain. 3. Oxycodone for severe pain. 4. Cefuroxime 500 mg b.i.d. for a 7 day course (14 with a refill).  FOLLOWUP:  Will be in the office in 4 days.     Mila Homer. Sudie Bailey, M.D.    SDK/MEDQ  D:  02/03/2013  T:  02/03/2013  Job:  [id number]

## 2013-02-03 NOTE — Progress Notes (Signed)
Notified Dr. Renard Matter due to patient c/o nausea related to empty stomach and pain med according to her.  New orders given and followed.

## 2013-02-08 NOTE — Progress Notes (Signed)
UR Chart Review Completed  

## 2013-05-22 ENCOUNTER — Emergency Department (HOSPITAL_COMMUNITY)
Admission: EM | Admit: 2013-05-22 | Discharge: 2013-05-22 | Disposition: A | Discharge status: Home / Self Care | Payer: Self-pay | Attending: Emergency Medicine | Admitting: Emergency Medicine

## 2013-05-22 ENCOUNTER — Encounter (HOSPITAL_COMMUNITY): Payer: Self-pay | Admitting: Emergency Medicine

## 2013-05-22 ENCOUNTER — Emergency Department (HOSPITAL_COMMUNITY)
Admission: EM | Admit: 2013-05-22 | Discharge: 2013-05-22 | Discharge status: Against Medical Advice | Payer: Self-pay | Attending: Emergency Medicine | Admitting: Emergency Medicine

## 2013-05-22 DIAGNOSIS — Z8739 Personal history of other diseases of the musculoskeletal system and connective tissue: Secondary | ICD-10-CM | POA: Insufficient documentation

## 2013-05-22 DIAGNOSIS — J45909 Unspecified asthma, uncomplicated: Secondary | ICD-10-CM | POA: Insufficient documentation

## 2013-05-22 DIAGNOSIS — IMO0002 Reserved for concepts with insufficient information to code with codable children: Secondary | ICD-10-CM | POA: Insufficient documentation

## 2013-05-22 DIAGNOSIS — Z9889 Other specified postprocedural states: Secondary | ICD-10-CM | POA: Insufficient documentation

## 2013-05-22 DIAGNOSIS — Z87891 Personal history of nicotine dependence: Secondary | ICD-10-CM | POA: Insufficient documentation

## 2013-05-22 DIAGNOSIS — Z79899 Other long term (current) drug therapy: Secondary | ICD-10-CM | POA: Insufficient documentation

## 2013-05-22 DIAGNOSIS — M5416 Radiculopathy, lumbar region: Secondary | ICD-10-CM

## 2013-05-22 DIAGNOSIS — M549 Dorsalgia, unspecified: Secondary | ICD-10-CM | POA: Insufficient documentation

## 2013-05-22 HISTORY — DX: Reserved for concepts with insufficient information to code with codable children: IMO0002

## 2013-05-22 MED ORDER — MELOXICAM 7.5 MG PO TABS: 7.5000 mg | ORAL_TABLET | Freq: Every day | ORAL | Status: DC

## 2013-05-22 MED ORDER — CYCLOBENZAPRINE HCL 10 MG PO TABS: 10.0000 mg | ORAL_TABLET | Freq: Two times a day (BID) | ORAL | Status: DC | PRN

## 2013-05-22 NOTE — ED Notes (Signed)
Pt refusing to see physician's on staff in er today, states "I have had bad problems with [place] PA, pt states "I am leaving if I have to see him", explained to pt that we only had certain physician's on staff today. Pt states that she is going somewhere else. ama form explained to pt and signed by same, pt left ambulatory, no problems with gait noted,

## 2013-05-22 NOTE — ED Notes (Addendum)
Pt c/o lower back pain that became worse after being "jerked" while walking a dog last night, pt state that she is no longer seeing Dr. Sudie Bailey for her back pain, is suppose to be transferred to a pain clinic.

## 2013-05-22 NOTE — ED Notes (Signed)
Pt c/o lower back pain after being "pulled and jerked" by dog while walking him last night.

## 2013-05-22 NOTE — ED Notes (Signed)
Pt left er ama because she refused to see er physician's that were on staff for that day, pt gait steady with no limp noted, pt ambulatory upon discharge from er.

## 2013-05-22 NOTE — ED Notes (Signed)
PA at bedside.

## 2013-05-22 NOTE — ED Notes (Signed)
Pt. Stated, i was walking my dog and he saw a cat and sorted dragged me.  I think it hurt my back.  I had surgery about a year ago.

## 2013-05-22 NOTE — ED Provider Notes (Signed)
History     CSN: [id number]  Arrival date & time 05/22/13  1057   First MD Initiated Contact with Patient 05/22/13 1119      Chief Complaint  Patient presents with  . Back Pain    (Consider location/radiation/quality/duration/timing/severity/associated sxs/prior treatment) HPI  Olivia Fuller is a 27 y.o. female complaining of low back pain after she was jerked by her dog while walking him last night. There was no fall or trauma. Patient rates her pain a 9/10 it is in the low back and is nonradiating, it is exacerbated by palpation weightbearing. Patient has had a prior laminectomy with microdiscectomy by Dr. Lovell Sheehan. Patient denies any numbness, paresthesia, difficulty ambulating, change in bowel or bladder habits, fever, history of IV drug use or history of cancer. Patient is pending admission into pain management clinic she has an appointment in August.  Past Medical History  Diagnosis Date  . Asthma   . Scoliosis   . DDD (degenerative disc disease)     Past Surgical History  Procedure Laterality Date  . No surgical history    . Lumbar laminectomy/decompression microdiscectomy  07/06/2012    Procedure: LUMBAR LAMINECTOMY/DECOMPRESSION MICRODISCECTOMY 1 LEVEL;  Surgeon: Cristi Loron, MD;  Location: [facility];  Service: Neurosurgery;  Laterality: Left;  LEFT Lumbar Five-Sacral One diskectomy  . Back surgery      No family history on file.  History  Substance Use Topics  . Smoking status: Former Smoker -- 5 years    Quit date: 02/14/2012  . Smokeless tobacco: Not on file  . Alcohol Use: No    OB History   Grav Para Term Preterm Abortions TAB SAB Ect Mult Living                  Review of Systems  Constitutional:       Negative except as described in HPI  HENT:       Negative except as described in HPI  Respiratory:       Negative except as described in HPI  Cardiovascular:       Negative except as described in HPI  Gastrointestinal:       Negative  except as described in HPI  Genitourinary:       Negative except as described in HPI  Musculoskeletal:       Negative except as described in HPI  Skin:       Negative except as described in HPI  Neurological:       Negative except as described in HPI  All other systems reviewed and are negative.    Allergies  Tylenol and Zofran  Home Medications   Current Outpatient Rx  Name  Route  Sig  Dispense  Refill  . albuterol (PROVENTIL HFA;VENTOLIN HFA) 108 (90 BASE) MCG/ACT inhaler   Inhalation   Inhale 2 puffs into the lungs every 6 (six) hours as needed for wheezing.         Marland Kitchen ALPRAZolam (XANAX) 1 MG tablet   Oral   Take 1 mg by mouth 4 (four) times daily as needed for anxiety.         . beclomethasone (QVAR) 80 MCG/ACT inhaler   Inhalation   Inhale 1 puff into the lungs as needed.         . Oxycodone HCl 10 MG TABS   Oral   Take 10 mg by mouth every 4 (four) hours as needed (pain).         Marland Kitchen  cyclobenzaprine (FLEXERIL) 10 MG tablet   Oral   Take 1 tablet (10 mg total) by mouth 2 (two) times daily as needed for muscle spasms.   20 tablet   0   . meloxicam (MOBIC) 7.5 MG tablet   Oral   Take 1 tablet (7.5 mg total) by mouth daily.   20 tablet   0     BP 109/68  Pulse 89  Temp(Src) 98.5 F (36.9 C) (Oral)  Resp 16  SpO2 97%  LMP 04/27/2013  Physical Exam  Nursing note and vitals reviewed. Constitutional: She is oriented to person, place, and time. She appears well-developed and well-nourished. No distress.  HENT:  Head: Normocephalic.  Eyes: Conjunctivae and EOM are normal.  Cardiovascular: Normal rate.   Pulmonary/Chest: Effort normal. No stridor.  Musculoskeletal: Normal range of motion.  Neurological: She is alert and oriented to person, place, and time.  Follows commands, Goal oriented speech, Strength is 5 out of 5x4 extremities, patient ambulates with a coordinated in nonantalgic gait. Sensation is grossly intact.  Psychiatric: She has a  normal mood and affect.    ED Course  Procedures (including critical care time)  Labs Reviewed - No data to display No results found.   1. Lumbar radicular pain       MDM   Filed Vitals:   05/22/13 1107  BP: 109/68  Pulse: 89  Temp: 98.5 F (36.9 C)  TempSrc: Oral  Resp: 16  SpO2: 97%     Olivia Fuller is a 27 y.o. female back pain.  No neurological deficits and normal neuro exam.  Patient can walk but states is painful.  No loss of bowel or bladder control.  No concern for cauda equina.  No fever, night sweats, weight loss, h/o cancer, IVDU.  RICE protocol and pain medicine indicated and discussed with patient.   Pt is hemodynamically stable, appropriate for, and amenable to discharge at this time. Pt verbalized understanding and agrees with care plan. Outpatient follow-up and specific return precautions discussed.    New Prescriptions   CYCLOBENZAPRINE (FLEXERIL) 10 MG TABLET    Take 1 tablet (10 mg total) by mouth 2 (two) times daily as needed for muscle spasms.   MELOXICAM (MOBIC) 7.5 MG TABLET    Take 1 tablet (7.5 mg total) by mouth daily.           Wynetta Emery, PA-C 05/22/13 1548

## 2013-05-23 NOTE — ED Provider Notes (Signed)
Medical screening examination/treatment/procedure(s) were performed by non-physician practitioner and as supervising physician I was immediately available for consultation/collaboration.   Laray Anger, DO 05/23/13 [phone]

## 2013-11-22 ENCOUNTER — Emergency Department (HOSPITAL_COMMUNITY)
Admission: EM | Admit: 2013-11-22 | Discharge: 2013-11-23 | Disposition: A | Discharge status: Home / Self Care | Payer: Self-pay | Attending: Emergency Medicine | Admitting: Emergency Medicine

## 2013-11-22 ENCOUNTER — Encounter (HOSPITAL_COMMUNITY): Payer: Self-pay | Admitting: Emergency Medicine

## 2013-11-22 DIAGNOSIS — J029 Acute pharyngitis, unspecified: Secondary | ICD-10-CM | POA: Insufficient documentation

## 2013-11-22 DIAGNOSIS — M4120 Other idiopathic scoliosis, site unspecified: Secondary | ICD-10-CM | POA: Insufficient documentation

## 2013-11-22 DIAGNOSIS — J069 Acute upper respiratory infection, unspecified: Secondary | ICD-10-CM | POA: Insufficient documentation

## 2013-11-22 DIAGNOSIS — Z87891 Personal history of nicotine dependence: Secondary | ICD-10-CM | POA: Insufficient documentation

## 2013-11-22 DIAGNOSIS — J329 Chronic sinusitis, unspecified: Secondary | ICD-10-CM | POA: Insufficient documentation

## 2013-11-22 DIAGNOSIS — J45909 Unspecified asthma, uncomplicated: Secondary | ICD-10-CM | POA: Insufficient documentation

## 2013-11-22 DIAGNOSIS — H9209 Otalgia, unspecified ear: Secondary | ICD-10-CM | POA: Insufficient documentation

## 2013-11-22 DIAGNOSIS — IMO0002 Reserved for concepts with insufficient information to code with codable children: Secondary | ICD-10-CM | POA: Insufficient documentation

## 2013-11-22 DIAGNOSIS — R51 Headache: Secondary | ICD-10-CM | POA: Insufficient documentation

## 2013-11-22 DIAGNOSIS — Z791 Long term (current) use of non-steroidal anti-inflammatories (NSAID): Secondary | ICD-10-CM | POA: Insufficient documentation

## 2013-11-22 DIAGNOSIS — Z79899 Other long term (current) drug therapy: Secondary | ICD-10-CM | POA: Insufficient documentation

## 2013-11-22 NOTE — ED Provider Notes (Signed)
CSN: [id number]     Arrival date & time 11/22/13  2337 History   First MD Initiated Contact with Patient 11/22/13 2348     Chief Complaint  Patient presents with  . Nasal Congestion  . Cough  . Sore Throat   (Consider location/radiation/quality/duration/timing/severity/associated sxs/prior Treatment) Patient is a 27 y.o. female presenting with cough and pharyngitis. The history is provided by the patient.  Cough Cough characteristics:  Non-productive Severity:  Moderate Onset quality:  Gradual Duration:  5 days Progression:  Worsening Chronicity:  New Smoker: no   Context: sick contacts and weather changes   Relieved by:  Nothing Worsened by:  Nothing tried Associated symptoms: ear pain, headaches, sinus congestion and sore throat   Associated symptoms: no chest pain, no chills, no eye discharge, no rash, no shortness of breath and no wheezing   Sore Throat Associated symptoms include congestion, coughing, headaches and a sore throat. Pertinent negatives include no abdominal pain, arthralgias, chest pain, chills, neck pain or rash.    Past Medical History  Diagnosis Date  . Asthma   . Scoliosis   . DDD (degenerative disc disease)    Past Surgical History  Procedure Laterality Date  . No surgical history    . Lumbar laminectomy/decompression microdiscectomy  07/06/2012    Procedure: LUMBAR LAMINECTOMY/DECOMPRESSION MICRODISCECTOMY 1 LEVEL;  Surgeon: Cristi Loron, MD;  Location: [facility];  Service: Neurosurgery;  Laterality: Left;  LEFT Lumbar Five-Sacral One diskectomy  . Back surgery     History reviewed. No pertinent family history. History  Substance Use Topics  . Smoking status: Former Smoker -- 5 years    Quit date: 02/14/2012  . Smokeless tobacco: Not on file  . Alcohol Use: No   OB History   Grav Para Term Preterm Abortions TAB SAB Ect Mult Living                 Review of Systems  Constitutional: Negative for chills and activity change.       All  ROS Neg except as noted in HPI  HENT: Positive for congestion, ear pain and sore throat. Negative for nosebleeds.   Eyes: Negative for photophobia and discharge.  Respiratory: Positive for cough. Negative for shortness of breath and wheezing.   Cardiovascular: Negative for chest pain and palpitations.  Gastrointestinal: Negative for abdominal pain and blood in stool.  Genitourinary: Negative for dysuria, frequency and hematuria.  Musculoskeletal: Negative for arthralgias, back pain and neck pain.  Skin: Negative.  Negative for rash.  Neurological: Positive for headaches. Negative for dizziness, seizures and speech difficulty.  Psychiatric/Behavioral: Negative for hallucinations and confusion.    Allergies  Tylenol and Zofran  Home Medications   Current Outpatient Rx  Name  Route  Sig  Dispense  Refill  . albuterol (PROVENTIL HFA;VENTOLIN HFA) 108 (90 BASE) MCG/ACT inhaler   Inhalation   Inhale 2 puffs into the lungs every 6 (six) hours as needed for wheezing.         Marland Kitchen ALPRAZolam (XANAX) 1 MG tablet   Oral   Take 1 mg by mouth 4 (four) times daily as needed for anxiety.         Marland Kitchen amphetamine-dextroamphetamine (ADDERALL) 20 MG tablet   Oral   Take 20 mg by mouth daily.         . beclomethasone (QVAR) 80 MCG/ACT inhaler   Inhalation   Inhale 1 puff into the lungs as needed.         Marland Kitchen  cyclobenzaprine (FLEXERIL) 10 MG tablet   Oral   Take 1 tablet (10 mg total) by mouth 2 (two) times daily as needed for muscle spasms.   20 tablet   0   . meloxicam (MOBIC) 7.5 MG tablet   Oral   Take 1 tablet (7.5 mg total) by mouth daily.   20 tablet   0   . Oxycodone HCl 10 MG TABS   Oral   Take 10 mg by mouth every 4 (four) hours as needed (pain).          BP 133/62  Pulse 100  Temp(Src) 98.8 F (37.1 C) (Oral)  Resp 20  Ht 5\' 7"  (1.702 m)  Wt 164 lb (74.39 kg)  BMI 25.68 kg/m2  SpO2 100%  LMP 10/25/2013 Physical Exam  Nursing note and vitals  reviewed. Constitutional: She is oriented to person, place, and time. She appears well-developed and well-nourished.  Non-toxic appearance.  HENT:  Head: Normocephalic.  Right Ear: Tympanic membrane and external ear normal.  Left Ear: Tympanic membrane and external ear normal.  Nasal congestion present. Mild increase redness of the posterior pharynx. Uvula midline  Eyes: EOM and lids are normal. Pupils are equal, round, and reactive to light.  Neck: Normal range of motion. Neck supple. Carotid bruit is not present.  Cardiovascular: Normal rate, regular rhythm, normal heart sounds, intact distal pulses and normal pulses.   Pulmonary/Chest: Breath sounds normal. No respiratory distress.  cpurse breath sounds. No wheeze. Symmetrical rise and fall of the chest.  Abdominal: Soft. Bowel sounds are normal. There is no tenderness. There is no guarding.  Musculoskeletal: Normal range of motion.  Lymphadenopathy:       Head (right side): No submandibular adenopathy present.       Head (left side): No submandibular adenopathy present.    She has no cervical adenopathy.  Neurological: She is alert and oriented to person, place, and time. She has normal strength. No cranial nerve deficit or sensory deficit.  Skin: Skin is warm and dry.  Psychiatric: She has a normal mood and affect. Her speech is normal.    ED Course  Procedures (including critical care time) Labs Review Labs Reviewed - No data to display Imaging Review No results found.  EKG Interpretation   None       MDM  No diagnosis found. **I have reviewed nursing notes, vital signs, and all appropriate lab and imaging results for this patient.* Patient treated in the emergency department with ibuprofen 800 mg, Afrin spray and prednisone. Prescription for prednisone and ibuprofen 800 mg given to the patient. Patient advised to increase fluids. She is to see her primary physician for additional management. Patient given instructions  on return if any acute changes or complications.   Kathie Dike, PA-C 11/23/13 [phone]

## 2013-11-22 NOTE — ED Notes (Signed)
Pt c/o cough, sore throat and congestion x5 days 

## 2013-11-23 MED ORDER — IBUPROFEN 800 MG PO TABS
800.0000 mg | ORAL_TABLET | Freq: Once | ORAL | Status: AC
  Administered 2013-11-23: 800 mg via ORAL
  Filled 2013-11-23: qty 1

## 2013-11-23 MED ORDER — OXYMETAZOLINE HCL 0.05 % NA SOLN
1.0000 | Freq: Once | NASAL | Status: AC
  Administered 2013-11-23: 1 via NASAL
  Filled 2013-11-23: qty 15

## 2013-11-23 MED ORDER — IBUPROFEN 800 MG PO TABS: 800.0000 mg | ORAL_TABLET | Freq: Three times a day (TID) | ORAL | Status: DC

## 2013-11-23 MED ORDER — PREDNISONE 50 MG PO TABS
50.0000 mg | ORAL_TABLET | Freq: Once | ORAL | Status: AC
  Administered 2013-11-23: 50 mg via ORAL
  Filled 2013-11-23: qty 1

## 2013-11-23 MED ORDER — PREDNISONE 10 MG PO TABS: ORAL_TABLET | ORAL | Status: DC

## 2013-11-23 NOTE — ED Provider Notes (Signed)
Medical screening examination/treatment/procedure(s) were performed by non-physician practitioner and as supervising physician I was immediately available for consultation/collaboration.  EKG Interpretation   None         Rokhaya Quinn L Omer Monter, MD 11/23/13 0752 

## 2013-11-23 NOTE — ED Notes (Signed)
Pt alert & oriented x4, stable gait. Patient given discharge instructions, paperwork & prescription(s). Patient  instructed to stop at the registration desk to finish any additional paperwork. Patient verbalized understanding. Pt left department w/ no further questions. 

## 2014-03-02 ENCOUNTER — Encounter (HOSPITAL_COMMUNITY): Payer: Self-pay | Admitting: Emergency Medicine

## 2014-03-02 ENCOUNTER — Emergency Department (HOSPITAL_COMMUNITY)
Admission: EM | Admit: 2014-03-02 | Discharge: 2014-03-02 | Disposition: A | Discharge status: Home / Self Care | Payer: Self-pay | Attending: Emergency Medicine | Admitting: Emergency Medicine

## 2014-03-02 DIAGNOSIS — IMO0002 Reserved for concepts with insufficient information to code with codable children: Secondary | ICD-10-CM | POA: Insufficient documentation

## 2014-03-02 DIAGNOSIS — Z79899 Other long term (current) drug therapy: Secondary | ICD-10-CM | POA: Insufficient documentation

## 2014-03-02 DIAGNOSIS — Z207 Contact with and (suspected) exposure to pediculosis, acariasis and other infestations: Secondary | ICD-10-CM

## 2014-03-02 DIAGNOSIS — B86 Scabies: Secondary | ICD-10-CM | POA: Insufficient documentation

## 2014-03-02 DIAGNOSIS — Z791 Long term (current) use of non-steroidal anti-inflammatories (NSAID): Secondary | ICD-10-CM | POA: Insufficient documentation

## 2014-03-02 DIAGNOSIS — Z87891 Personal history of nicotine dependence: Secondary | ICD-10-CM | POA: Insufficient documentation

## 2014-03-02 DIAGNOSIS — J45909 Unspecified asthma, uncomplicated: Secondary | ICD-10-CM | POA: Insufficient documentation

## 2014-03-02 DIAGNOSIS — Z2089 Contact with and (suspected) exposure to other communicable diseases: Secondary | ICD-10-CM

## 2014-03-02 MED ORDER — PERMETHRIN 5 % EX CREA
TOPICAL_CREAM | CUTANEOUS | Status: DC
Start: 2014-03-02 — End: 2014-06-10

## 2014-03-02 NOTE — ED Provider Notes (Signed)
CSN: [id number]     Arrival date & time 03/02/14  1924 History   First MD Initiated Contact with Patient 03/02/14 1948     Chief Complaint  Patient presents with  . Rash     (Consider location/radiation/quality/duration/timing/severity/associated sxs/prior Treatment) HPI Comments: Olivia Fuller is a 28 y.o. 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  She has developed itching with small scattered raised papules which started on her left antecubital space and has now spread to her forearms,  Hands and a few scattered areas along her bra line and in her groin creases.  She has taken no medicines prior to arrival.  She denies fever and chills, sore throat, headache, and there has been no drainage from the rash lesions.     The history is provided by the patient.    Past Medical History  Diagnosis Date  . Asthma   . Scoliosis   . DDD (degenerative disc disease)    Past Surgical History  Procedure Laterality Date  . No surgical history    . Lumbar laminectomy/decompression microdiscectomy  07/06/2012    Procedure: LUMBAR LAMINECTOMY/DECOMPRESSION MICRODISCECTOMY 1 LEVEL;  Surgeon: Cristi LoronJeffrey D Jenkins, MD;  Location: [facility];  Service: Neurosurgery;  Laterality: Left;  LEFT Lumbar Five-Sacral One diskectomy  . Back surgery     History reviewed. No pertinent family history. History  Substance Use Topics  . Smoking status: Former Smoker -- 5 years    Quit date: 02/14/2012  . Smokeless tobacco: Not on file  . Alcohol Use: No   OB History   Grav Para Term Preterm Abortions TAB SAB Ect Mult Living                 Review of Systems  Constitutional: Negative for fever and chills.  HENT: Negative for facial swelling.   Respiratory: Negative for shortness of breath and wheezing.   Skin: Positive for rash.  Neurological: Negative for numbness.      Allergies  Tylenol and Zofran  Home Medications   Current Outpatient Rx  Name  Route  Sig   Dispense  Refill  . albuterol (PROVENTIL HFA;VENTOLIN HFA) 108 (90 BASE) MCG/ACT inhaler   Inhalation   Inhale 2 puffs into the lungs every 6 (six) hours as needed for wheezing.         Marland Kitchen. ALPRAZolam (XANAX) 1 MG tablet   Oral   Take 1 mg by mouth 4 (four) times daily as needed for anxiety.         Marland Kitchen. amphetamine-dextroamphetamine (ADDERALL) 20 MG tablet   Oral   Take 20 mg by mouth daily.         . beclomethasone (QVAR) 80 MCG/ACT inhaler   Inhalation   Inhale 1 puff into the lungs as needed.         . cyclobenzaprine (FLEXERIL) 10 MG tablet   Oral   Take 1 tablet (10 mg total) by mouth 2 (two) times daily as needed for muscle spasms.   20 tablet   0   . ibuprofen (ADVIL,MOTRIN) 800 MG tablet   Oral   Take 1 tablet (800 mg total) by mouth 3 (three) times daily.   21 tablet   0   . meloxicam (MOBIC) 7.5 MG tablet   Oral   Take 1 tablet (7.5 mg total) by mouth daily.   20 tablet   0   . Oxycodone HCl 10 MG TABS   Oral  Take 10 mg by mouth every 4 (four) hours as needed (pain).         . permethrin (ELIMITE) 5 % cream      Apply as directed and leave on for 8-12 hours.  May repeat x 1 in 10 days if symptoms persist   60 g   1   . predniSONE (DELTASONE) 10 MG tablet      5,4,3,2,1 - take with food   15 tablet   0    BP 117/63  Pulse 83  Temp(Src) 97.9 F (36.6 C) (Oral)  Resp 20  Ht 5\' 7"  (1.702 m)  Wt 154 lb (69.854 kg)  BMI 24.11 kg/m2  SpO2 100%  LMP 01/31/2014 Physical Exam  Constitutional: She appears well-developed and well-nourished. No distress.  HENT:  Head: Normocephalic.  Neck: Neck supple.  Cardiovascular: Normal rate.   Pulmonary/Chest: Effort normal. She has no wheezes.  Musculoskeletal: Normal range of motion. She exhibits no edema.  Skin: Rash noted. Rash is papular.  Tiny scattered small papules bilateral hands,  Volar forearms, elbow creases and along bra line of chest.      ED Course  Procedures (including critical  care time) Labs Review Labs Reviewed - No data to display Imaging Review No results found.   EKG Interpretation None      MDM   Final diagnoses:  Scabies  Scabies exposure    Patient with pruritic rash and known exposure to scabies.  She was prescribed permethrin with one refill.  Instructions given on use and reasons to repeat in 10-14 days.  When necessary followup anticipated.    Burgess Amor, PA-C 03/02/14 2014

## 2014-03-02 NOTE — Discharge Instructions (Signed)
Scabies  Scabies are small bugs (mites) that burrow under the skin and cause red bumps and severe itching. These bugs can only be seen with a microscope. Scabies are highly contagious. They can spread easily from person to person by direct contact. They are also spread through sharing clothing or linens that have the scabies mites living in them. It is not unusual for an entire family to become infected through shared towels, clothing, or bedding.   HOME CARE INSTRUCTIONS   · Your caregiver may prescribe a cream or lotion to kill the mites. If cream is prescribed, massage the cream into the entire body from the neck to the bottom of both feet. Also massage the cream into the scalp and face if your child is less than 1 year old. Avoid the eyes and mouth. Do not wash your hands after application.  · Leave the cream on for 8 to 12 hours. Your child should bathe or shower after the 8 to 12 hour application period. Sometimes it is helpful to apply the cream to your child right before bedtime.  · One treatment is usually effective and will eliminate approximately 95% of infestations. For severe cases, your caregiver may decide to repeat the treatment in 1 week. Everyone in your household should be treated with one application of the cream.  · New rashes or burrows should not appear within 24 to 48 hours after successful treatment. However, the itching and rash may last for 2 to 4 weeks after successful treatment. Your caregiver may prescribe a medicine to help with the itching or to help the rash go away more quickly.  · Scabies can live on clothing or linens for up to 3 days. All of your child's recently used clothing, towels, stuffed toys, and bed linens should be washed in hot water and then dried in a dryer for at least 20 minutes on high heat. Items that cannot be washed should be enclosed in a plastic bag for at least 3 days.  · To help relieve itching, bathe your child in a cool bath or apply cool washcloths to the  affected areas.  · Your child may return to school after treatment with the prescribed cream.  SEEK MEDICAL CARE IF:   · The itching persists longer than 4 weeks after treatment.  · The rash spreads or becomes infected. Signs of infection include red blisters or yellow-tan crust.  Document Released: 11/22/2005 Document Revised: 02/14/2012 Document Reviewed: 04/02/2009  Exit[employer]® Patient Information ©2014 [employer].

## 2014-03-02 NOTE — ED Notes (Signed)
Pt states there has been an outbreak of scabies where she works. Pt has a rash on arms, hands, and legs.

## 2014-03-03 NOTE — ED Provider Notes (Signed)
Medical screening examination/treatment/procedure(s) were performed by non-physician practitioner and as supervising physician I was immediately available for consultation/collaboration.   EKG Interpretation None       Kaylany Tesoriero, MD 03/03/14 1513 

## 2014-06-10 ENCOUNTER — Encounter (HOSPITAL_COMMUNITY): Payer: Self-pay | Admitting: Emergency Medicine

## 2014-06-10 ENCOUNTER — Emergency Department (HOSPITAL_COMMUNITY)
Admission: EM | Admit: 2014-06-10 | Discharge: 2014-06-10 | Disposition: A | Discharge status: Home / Self Care | Payer: Self-pay | Attending: Emergency Medicine | Admitting: Emergency Medicine

## 2014-06-10 DIAGNOSIS — R350 Frequency of micturition: Secondary | ICD-10-CM | POA: Insufficient documentation

## 2014-06-10 DIAGNOSIS — J45909 Unspecified asthma, uncomplicated: Secondary | ICD-10-CM | POA: Insufficient documentation

## 2014-06-10 DIAGNOSIS — Z87891 Personal history of nicotine dependence: Secondary | ICD-10-CM | POA: Insufficient documentation

## 2014-06-10 DIAGNOSIS — Z8739 Personal history of other diseases of the musculoskeletal system and connective tissue: Secondary | ICD-10-CM | POA: Insufficient documentation

## 2014-06-10 DIAGNOSIS — N760 Acute vaginitis: Secondary | ICD-10-CM | POA: Insufficient documentation

## 2014-06-10 DIAGNOSIS — R399 Unspecified symptoms and signs involving the genitourinary system: Secondary | ICD-10-CM

## 2014-06-10 DIAGNOSIS — M549 Dorsalgia, unspecified: Secondary | ICD-10-CM | POA: Insufficient documentation

## 2014-06-10 DIAGNOSIS — Z3202 Encounter for pregnancy test, result negative: Secondary | ICD-10-CM | POA: Insufficient documentation

## 2014-06-10 DIAGNOSIS — Z79899 Other long term (current) drug therapy: Secondary | ICD-10-CM | POA: Insufficient documentation

## 2014-06-10 DIAGNOSIS — B9689 Other specified bacterial agents as the cause of diseases classified elsewhere: Secondary | ICD-10-CM | POA: Insufficient documentation

## 2014-06-10 DIAGNOSIS — R300 Dysuria: Secondary | ICD-10-CM | POA: Insufficient documentation

## 2014-06-10 DIAGNOSIS — A499 Bacterial infection, unspecified: Secondary | ICD-10-CM | POA: Insufficient documentation

## 2014-06-10 LAB — WET PREP, GENITAL
TRICH WET PREP: NONE SEEN
WBC WET PREP: NONE SEEN
YEAST WET PREP: NONE SEEN

## 2014-06-10 LAB — URINALYSIS, ROUTINE W REFLEX MICROSCOPIC
BILIRUBIN URINE: NEGATIVE
GLUCOSE, UA: NEGATIVE mg/dL
Hgb urine dipstick: NEGATIVE
KETONES UR: NEGATIVE mg/dL
Leukocytes, UA: NEGATIVE
NITRITE: NEGATIVE
PH: 7 (ref 5.0–8.0)
PROTEIN: NEGATIVE mg/dL
Specific Gravity, Urine: 1.01 (ref 1.005–1.030)
Urobilinogen, UA: 0.2 mg/dL (ref 0.0–1.0)

## 2014-06-10 LAB — PREGNANCY, URINE: PREG TEST UR: NEGATIVE

## 2014-06-10 MED ORDER — SULFAMETHOXAZOLE-TRIMETHOPRIM 800-160 MG PO TABS: 1.0000 | ORAL_TABLET | Freq: Two times a day (BID) | ORAL | Status: AC

## 2014-06-10 MED ORDER — PHENAZOPYRIDINE HCL 200 MG PO TABS: 200.0000 mg | ORAL_TABLET | Freq: Three times a day (TID) | ORAL | Status: DC

## 2014-06-10 MED ORDER — AZITHROMYCIN 250 MG PO TABS
1000.0000 mg | ORAL_TABLET | Freq: Once | ORAL | Status: AC
  Administered 2014-06-10: 1000 mg via ORAL
  Filled 2014-06-10: qty 4

## 2014-06-10 MED ORDER — METRONIDAZOLE 500 MG PO TABS: 500.0000 mg | ORAL_TABLET | Freq: Two times a day (BID) | ORAL | Status: DC

## 2014-06-10 NOTE — ED Notes (Addendum)
Pt reports lower abdominal pain, dysuria,increased frequency. Pt reports has had UTI's in the past. nad noted. Pt denies any fevers, n/v/d.

## 2014-06-10 NOTE — ED Provider Notes (Signed)
CSN: [id number]     Arrival date & time 06/10/14  0918 History   First MD Initiated Contact with Patient 06/10/14 [id number]     Chief Complaint  Patient presents with  . Abdominal Pain     (Consider location/radiation/quality/duration/timing/severity/associated sxs/prior Treatment) Patient is a 28 y.o. female presenting with dysuria. The history is provided by the patient.  Dysuria Pain quality:  Burning Onset quality:  Gradual Duration:  5 days Timing:  Constant Progression:  Worsening Chronicity:  New Recent urinary tract infections: no   Relieved by:  None tried Worsened by:  Nothing tried Ineffective treatments:  Cranberry juice Associated symptoms: abdominal pain   Associated symptoms: no fever, no flank pain, no nausea, no vaginal discharge and no vomiting   Risk factors: recurrent urinary tract infections and sexually active   Risk factors: no hx of urolithiasis, no kidney transplant, not pregnant, no renal disease and no sexually transmitted infections    Olivia SpurrHeather M Fuller is a 28 y.o. female who presents to the ED with UTI symptoms. She reports that [place] has been at the beach and the symptoms started there she can usually drink Cranberry juice when she feels like one coming on and it goes away. This time is is getting worse. She has had frequent UTI's in the past but the last one was about 2 years ago.   Past Medical History  Diagnosis Date  . Asthma   . Scoliosis   . DDD (degenerative disc disease)    Past Surgical History  Procedure Laterality Date  . No surgical history    . Lumbar laminectomy/decompression microdiscectomy  07/06/2012    Procedure: LUMBAR LAMINECTOMY/DECOMPRESSION MICRODISCECTOMY 1 LEVEL;  Surgeon: Cristi LoronJeffrey D Jenkins, MD;  Location: [facility];  Service: Neurosurgery;  Laterality: Left;  LEFT Lumbar Five-Sacral One diskectomy  . Back surgery     History reviewed. No pertinent family history. History  Substance Use Topics  . Smoking status: Former Smoker  -- 5 years    Quit date: 02/14/2012  . Smokeless tobacco: Not on file  . Alcohol Use: No   OB History   Grav Para Term Preterm Abortions TAB SAB Ect Mult Living                 Review of Systems  Constitutional: Negative for fever and chills.  HENT: Negative.   Eyes: Negative for visual disturbance.  Respiratory: Negative for shortness of breath and wheezing.   Cardiovascular: Negative for chest pain and palpitations.  Gastrointestinal: Positive for abdominal pain. Negative for nausea and vomiting.  Genitourinary: Positive for dysuria. Negative for flank pain, vaginal bleeding and vaginal discharge.  Musculoskeletal: Positive for back pain.  Skin: Negative for rash.  Neurological: Negative for dizziness, syncope and headaches.  Psychiatric/Behavioral: Negative for confusion. The patient is not nervous/anxious.       Allergies  Tylenol and Zofran  Home Medications   Prior to Admission medications   Medication Sig Start Date End Date Taking? Authorizing Provider  albuterol (PROVENTIL HFA;VENTOLIN HFA) 108 (90 BASE) MCG/ACT inhaler Inhale 2 puffs into the lungs every 6 (six) hours as needed for wheezing.   Yes Historical Provider, MD  amphetamine-dextroamphetamine (ADDERALL) 20 MG tablet Take 20 mg by mouth daily.   Yes Historical Provider, MD  phenazopyridine (PYRIDIUM) 200 MG tablet Take 1 tablet (200 mg total) by mouth 3 (three) times daily. 06/10/14   Hope Orlene OchM Neese, NP  sulfamethoxazole-trimethoprim (BACTRIM DS,SEPTRA DS) 800-160 MG per tablet Take 1 tablet by mouth  2 (two) times daily. 06/10/14 06/17/14  Hope Orlene Och, NP   BP 121/91  Pulse 84  Temp(Src) 98.1 F (36.7 C) (Oral)  Resp 18  Ht 5\' 7"  (1.702 m)  Wt 160 lb (72.576 kg)  BMI 25.05 kg/m2  SpO2 99%  LMP 05/06/2014 Physical Exam  Nursing note and vitals reviewed. Constitutional: She is oriented to person, place, and time. She appears well-developed and well-nourished. No distress.  HENT:  Head: Normocephalic.   Eyes: EOM are normal.  Neck: Neck supple.  Cardiovascular: Normal rate.   Pulmonary/Chest: Effort normal.  Abdominal: Soft. Bowel sounds are normal. There is tenderness in the suprapubic area. There is no CVA tenderness.  Genitourinary:  External genitalia without lesions, white discharge vaginal vault. No CMT, no adnexal tenderness or mass palpated. Uterus without enlargement.   Musculoskeletal: Normal range of motion.  Neurological: She is alert and oriented to person, place, and time. No cranial nerve deficit.  Skin: Skin is warm and dry.  Psychiatric: She has a normal mood and affect. Her behavior is normal.   Results for orders placed during the hospital encounter of 06/10/14 (from the past 24 hour(s))  PREGNANCY, URINE     Status: None   Collection Time    06/10/14  9:30 AM      Result Value Ref Range   Preg Test, Ur NEGATIVE  NEGATIVE  URINALYSIS, ROUTINE W REFLEX MICROSCOPIC     Status: None   Collection Time    06/10/14  9:30 AM      Result Value Ref Range   Color, Urine YELLOW  YELLOW   APPearance CLEAR  CLEAR   Specific Gravity, Urine 1.010  1.005 - 1.030   pH 7.0  5.0 - 8.0   Glucose, UA NEGATIVE  NEGATIVE mg/dL   Hgb urine dipstick NEGATIVE  NEGATIVE   Bilirubin Urine NEGATIVE  NEGATIVE   Ketones, ur NEGATIVE  NEGATIVE mg/dL   Protein, ur NEGATIVE  NEGATIVE mg/dL   Urobilinogen, UA 0.2  0.0 - 1.0 mg/dL   Nitrite NEGATIVE  NEGATIVE   Leukocytes, UA NEGATIVE  NEGATIVE  WET PREP, GENITAL     Status: Abnormal   Collection Time    06/10/14 10:26 AM      Result Value Ref Range   Yeast Wet Prep HPF POC NONE SEEN  NONE SEEN   Trich, Wet Prep NONE SEEN  NONE SEEN   Clue Cells Wet Prep HPF POC MANY (*) NONE SEEN   WBC, Wet Prep HPF POC NONE SEEN  NONE SEEN    ED Course  Procedures Labs, zythromax 1 gram PO MDM  28 y.o. female with dysuria, frequency and urgency x 5 days. Urine negative, cultures for GC and Chlamydia pending. Treated with Zithromax since patient has  dysuria and negative urine in the event she may have chlamydia. Urine sent for culture. Will start medication for UTI while culture pending. Will treat for bladder spasm. Will treat for BV. Discussed with the patient and all questioned fully answered. She voices understanding.    Medication List    TAKE these medications       metroNIDAZOLE 500 MG tablet  Commonly known as:  FLAGYL  Take 1 tablet (500 mg total) by mouth 2 (two) times daily.     phenazopyridine 200 MG tablet  Commonly known as:  PYRIDIUM  Take 1 tablet (200 mg total) by mouth 3 (three) times daily.     sulfamethoxazole-trimethoprim 800-160 MG per tablet  Commonly known as:  BACTRIM DS,SEPTRA DS  Take 1 tablet by mouth 2 (two) times daily.      ASK your doctor about these medications       albuterol 108 (90 BASE) MCG/ACT inhaler  Commonly known as:  PROVENTIL HFA;VENTOLIN HFA  Inhale 2 puffs into the lungs every 6 (six) hours as needed for wheezing.     amphetamine-dextroamphetamine 20 MG tablet  Commonly known as:  ADDERALL  Take 20 mg by mouth daily.           [facility] Orlene OchM Neese, [place] 06/10/14 [phone]

## 2014-06-10 NOTE — Discharge Instructions (Signed)
Your urine today does not show infection. We have sent the urine for culture. While the culture is pending I will start you on antibiotics and a medication for bladder spasm. We will call you if you need additional medication. Return as needed for worsening symptoms.

## 2014-06-11 LAB — GC/CHLAMYDIA PROBE AMP
CT Probe RNA: NEGATIVE
GC Probe RNA: NEGATIVE

## 2014-06-11 NOTE — ED Provider Notes (Signed)
Medical screening examination/treatment/procedure(s) were conducted as a shared visit with non-physician practitioner(s) and myself.  I personally evaluated the patient during the encounter.   EKG Interpretation None     No acute abd.  Neg UA.  Rx for BV  Donnetta HutchingBrian Leesa Leifheit, MD 06/11/14 [phone]

## 2014-06-12 LAB — URINE CULTURE

## 2014-06-13 NOTE — Progress Notes (Signed)
ED Antimicrobial Stewardship Positive Culture Follow Up   Olivia SpurrHeather M Fuller is an 28 y.o. female who [facility] with a chief complaint of  Chief Complaint  Patient presents with  . Abdominal Pain    Recent Results (from the past 720 hour(s))  GC/CHLAMYDIA PROBE AMP     Status: None   Collection Time    06/10/14 10:26 AM      Result Value Ref Range Status   CT Probe RNA NEGATIVE  NEGATIVE Final   GC Probe RNA NEGATIVE  NEGATIVE Final   Comment: (NOTE)                                                                                               **Normal Reference Range: Negative**          Assay performed using the Gen-Probe APTIMA COMBO2 (R) Assay.     Acceptable specimen types for this assay include APTIMA Swabs (Unisex,     endocervical, urethral, or vaginal), first void urine, and ThinPrep     liquid based cytology samples.     Performed at [facility]  WET PREP, GENITAL     Status: Abnormal   Collection Time    06/10/14 10:26 AM      Result Value Ref Range Status   Yeast Wet Prep HPF POC NONE SEEN  NONE SEEN Final   Trich, Wet Prep NONE SEEN  NONE SEEN Final   Clue Cells Wet Prep HPF POC MANY (*) NONE SEEN Final   WBC, Wet Prep HPF POC NONE SEEN  NONE SEEN Final  URINE CULTURE     Status: None   Collection Time    06/10/14 10:54 AM      Result Value Ref Range Status   Specimen Description URINE, CLEAN CATCH   Final   Special Requests NONE   Final   Culture  Setup Time     Final   Value: 06/10/2014 19:42     Performed at [facility]   Colony Count     Final   Value: 75,000 COLONIES/ML     Performed at [facility]   Culture     Final   Value: ESCHERICHIA COLI     Performed at [facility]   Report Status 06/12/2014 FINAL   Final   Organism ID, Bacteria ESCHERICHIA COLI   Final    [x]  Treated with Bactrim, organism resistant to prescribed antimicrobial []  Patient discharged originally without antimicrobial  agent and treatment is now indicated  New antibiotic prescription: cephalexin 500mg  PO TID x 7 days  ED Provider: [place] RavensMercedes Camprubi-Soms PA-C   Mickeal SkinnerFrens, Kenji Mapel John 06/13/2014, 9:18 AM Infectious Diseases Pharmacist Phone# [phone]

## 2014-06-14 ENCOUNTER — Telehealth (HOSPITAL_BASED_OUTPATIENT_CLINIC_OR_DEPARTMENT_OTHER): Payer: Self-pay | Admitting: Emergency Medicine

## 2014-06-14 NOTE — Telephone Encounter (Signed)
Post ED Visit - Positive Culture Follow-up: Successful Patient Follow-Up  Culture assessed and recommendations reviewed by: []  Wes Dulaney, Pharm.D., BCPS [x]  Celedonio MiyamotoJeremy Frens, Pharm.D., BCPS []  Georgina PillionElizabeth Martin, Pharm.D., BCPS []  SteamboatMinh Pham, Vermont[place]arm.D., BCPS, AAHIVP []  Estella HuskMichelle Turner, Pharm.D., BCPS, AAHIVP  Positive urine culture  []  Patient discharged without antimicrobial prescription and treatment is now indicated [x]  Organism is resistant to prescribed ED discharge antimicrobial []  Patient with positive blood cultures  Changes discussed with ED provider: Allen DerryMercedes Camprubi-Soms PA-C New antibiotic prescription: Keflex 500 mg PO TID x 7 days    Liandro Thelin 06/14/2014, 1:01 PM

## 2014-06-17 ENCOUNTER — Telehealth (HOSPITAL_COMMUNITY): Payer: Self-pay

## 2014-06-17 NOTE — ED Notes (Signed)
Post ED Visit - Positive Culture Follow-up: Successful Patient Follow-Up  Culture assessed and recommendations reviewed by: []  Wes Dulaney, Pharm.D., BCPS [x]  Celedonio MiyamotoJeremy Frens, Pharm.D., BCPS []  Georgina PillionElizabeth Martin, Pharm.D., BCPS []  HollidayMinh Pham, [address] Rainbow BoulevardPharm.D., BCPS, AAHIVP []  Estella HuskMichelle Turner, Pharm.D., BCPS, AAHIVP  Positive urine culture  []  Patient discharged without antimicrobial prescription and treatment is now indicated [x]  Organism is resistant to prescribed ED discharge antimicrobial []  Patient with positive blood cultures  Changes discussed with ED provider: Allen DerryMercedes Camprubi-Soms PA New antibiotic prescription Keflex 500 mg po tid x 7 days/ pt refused has f/u with pcp tomorrow Called to none  Contacted patient, date 06/17/2014, time 1227   Ashley JacobsFesterman, Ariana Cavenaugh C 06/17/2014, 12:26 PM

## 2014-09-24 ENCOUNTER — Emergency Department (HOSPITAL_COMMUNITY)
Admission: EM | Admit: 2014-09-24 | Discharge: 2014-09-24 | Disposition: A | Discharge status: Home / Self Care | Payer: Self-pay | Attending: Emergency Medicine | Admitting: Emergency Medicine

## 2014-09-24 ENCOUNTER — Encounter (HOSPITAL_COMMUNITY): Payer: Self-pay | Admitting: Emergency Medicine

## 2014-09-24 DIAGNOSIS — J069 Acute upper respiratory infection, unspecified: Secondary | ICD-10-CM | POA: Insufficient documentation

## 2014-09-24 DIAGNOSIS — Z8739 Personal history of other diseases of the musculoskeletal system and connective tissue: Secondary | ICD-10-CM | POA: Insufficient documentation

## 2014-09-24 DIAGNOSIS — Z79899 Other long term (current) drug therapy: Secondary | ICD-10-CM | POA: Insufficient documentation

## 2014-09-24 DIAGNOSIS — J029 Acute pharyngitis, unspecified: Secondary | ICD-10-CM | POA: Insufficient documentation

## 2014-09-24 DIAGNOSIS — Z87891 Personal history of nicotine dependence: Secondary | ICD-10-CM | POA: Insufficient documentation

## 2014-09-24 DIAGNOSIS — J45901 Unspecified asthma with (acute) exacerbation: Secondary | ICD-10-CM | POA: Insufficient documentation

## 2014-09-24 MED ORDER — BENZONATATE 100 MG PO CAPS: 100.0000 mg | ORAL_CAPSULE | Freq: Three times a day (TID) | ORAL | Status: DC

## 2014-09-24 MED ORDER — PREDNISONE 20 MG PO TABS: 60.0000 mg | ORAL_TABLET | Freq: Every day | ORAL | Status: DC

## 2014-09-24 NOTE — Discharge Instructions (Signed)
Upper Respiratory Infection, Adult An upper respiratory infection (URI) is also known as the common cold. It is often caused by a type of germ (virus). Colds are easily spread (contagious). You can pass it to others by kissing, coughing, sneezing, or drinking out of the same glass. Usually, you get better in 1 or 2 weeks.  HOME CARE   Only take medicine as told by your doctor.  Use a warm mist humidifier or breathe in steam from a hot shower.  Drink enough water and fluids to keep your pee (urine) clear or pale yellow.  Get plenty of rest.  Return to work when your temperature is back to normal or as told by your doctor. You may use a face mask and wash your hands to stop your cold from spreading. GET HELP RIGHT AWAY IF:   After the first few days, you feel you are getting worse.  You have questions about your medicine.  You have chills, shortness of breath, or brown or red spit (mucus).  You have yellow or brown snot (nasal discharge) or pain in the face, especially when you bend forward.  You have a fever, puffy (swollen) neck, pain when you swallow, or white spots in the back of your throat.  You have a bad headache, ear pain, sinus pain, or chest pain.  You have a high-pitched whistling sound when you breathe in and out (wheezing).  You have a lasting cough or cough up blood.  You have sore muscles or a stiff neck. MAKE SURE YOU:   Understand these instructions.  Will watch your condition.  Will get help right away if you are not doing well or get worse. Document Released: 05/10/2008 Document Revised: 02/14/2012 Document Reviewed: 02/27/2014 ExitCare Patient Information 2015 [employer]. This information is not intended to replace advice given to you by your health care provider. Make sure you discuss any questions you have with your health care provider.  

## 2014-09-24 NOTE — ED Provider Notes (Signed)
CSN: [id number]     Arrival date & time 09/24/14  1211 History   First MD Initiated Contact with Patient 09/24/14 1259     Chief Complaint  Patient presents with  . Sore Throat     (Consider location/radiation/quality/duration/timing/severity/associated sxs/prior Treatment) Patient is a 28 y.o. female presenting with pharyngitis. The history is provided by the patient. No language interpreter was used.  Sore Throat This is a new problem. The current episode started in the past 7 days. The problem occurs constantly. The problem has been unchanged. Associated symptoms include congestion, coughing and a sore throat. Pertinent negatives include no abdominal pain, chills, fever, myalgias, rash or vomiting. Associated symptoms comments: Sore throat and cough without fever for the past 3 days. She reports blood-tinged sputum intermittently. She has a history of asthma and reports inhaler use does not help her symptoms. No N, V. She feels she is wheezing and has mild chest tightness like her asthma symptoms. .    Past Medical History  Diagnosis Date  . Asthma   . Scoliosis   . DDD (degenerative disc disease)    Past Surgical History  Procedure Laterality Date  . No surgical history    . Lumbar laminectomy/decompression microdiscectomy  07/06/2012    Procedure: LUMBAR LAMINECTOMY/DECOMPRESSION MICRODISCECTOMY 1 LEVEL;  Surgeon: Cristi LoronJeffrey D Jenkins, MD;  Location: [facility];  Service: Neurosurgery;  Laterality: Left;  LEFT Lumbar Five-Sacral One diskectomy  . Back surgery     History reviewed. No pertinent family history. History  Substance Use Topics  . Smoking status: Former Smoker -- 5 years    Quit date: 02/14/2012  . Smokeless tobacco: Not on file  . Alcohol Use: No   OB History   Grav Para Term Preterm Abortions TAB SAB Ect Mult Living                 Review of Systems  Constitutional: Negative for fever and chills.  HENT: Positive for congestion and sore throat. Negative for  trouble swallowing.   Respiratory: Positive for cough, chest tightness and wheezing.   Cardiovascular: Negative.   Gastrointestinal: Negative.  Negative for vomiting and abdominal pain.  Musculoskeletal: Negative.  Negative for myalgias.  Skin: Negative.  Negative for rash.  Neurological: Negative.  Negative for light-headedness.      Allergies  Tylenol and Zofran  Home Medications   Prior to Admission medications   Medication Sig Start Date End Date Taking? Authorizing Provider  albuterol (PROVENTIL HFA;VENTOLIN HFA) 108 (90 BASE) MCG/ACT inhaler Inhale 2 puffs into the lungs every 6 (six) hours as needed for wheezing.   Yes Historical Provider, MD  amphetamine-dextroamphetamine (ADDERALL) 20 MG tablet Take 20 mg by mouth daily.   Yes Historical Provider, MD   BP 119/70  Temp(Src) 98.3 F (36.8 C) (Oral)  Ht 5\' 7"  (1.702 m)  Wt 170 lb (77.111 kg)  BMI 26.62 kg/m2  SpO2 100%  LMP 09/03/2014 Physical Exam  Constitutional: She is oriented to person, place, and time. She appears well-developed and well-nourished. No distress.  HENT:  Head: Normocephalic.  Nose: Mucosal edema present.  Mouth/Throat: Mucous membranes are normal. Posterior oropharyngeal erythema present. No posterior oropharyngeal edema.  Neck: Normal range of motion. Neck supple.  Cardiovascular: Normal rate and regular rhythm.   Pulmonary/Chest: Effort normal and breath sounds normal. She has no wheezes. She has no rales.  Abdominal: Soft. Bowel sounds are normal. There is no tenderness. There is no rebound and no guarding.  Musculoskeletal: Normal range  of motion.  Neurological: She is alert and oriented to person, place, and time.  Skin: Skin is warm and dry. No rash noted.  Psychiatric: She has a normal mood and affect.    ED Course  Procedures (including critical care time) Labs Review Labs Reviewed - No data to display  Imaging Review No results found.   EKG Interpretation None      MDM    Final diagnoses:  None    1. URI 2. History of asthma  Unremarkable exam with normal VS - no hypoxia, tachycardia or tachypnea. No wheezing. She is well appearing and in NAD.  Will start on prednisone, recommend OTC supportive measures. Encourage follow up with PCP for persistent or worsening symptoms.    Arnoldo HookerShari A Austen Oyster, PA-C 09/24/14 [phone]

## 2014-09-24 NOTE — ED Notes (Signed)
Pt states her throat is sore and has a cough.  Has coughed up blood.

## 2014-09-25 NOTE — ED Provider Notes (Signed)
Medical screening examination/treatment/procedure(s) were performed by non-physician practitioner and as supervising physician I was immediately available for consultation/collaboration.   EKG Interpretation None        Joya Gaskinsonald W Pattricia Weiher, MD 09/25/14 [phone]

## 2014-12-01 ENCOUNTER — Encounter (HOSPITAL_COMMUNITY): Payer: Self-pay | Admitting: Emergency Medicine

## 2014-12-01 ENCOUNTER — Emergency Department (HOSPITAL_COMMUNITY): Payer: Self-pay

## 2014-12-01 ENCOUNTER — Emergency Department (HOSPITAL_COMMUNITY)
Admission: EM | Admit: 2014-12-01 | Discharge: 2014-12-01 | Disposition: A | Discharge status: Home / Self Care | Payer: Self-pay | Attending: Emergency Medicine | Admitting: Emergency Medicine

## 2014-12-01 DIAGNOSIS — Z3202 Encounter for pregnancy test, result negative: Secondary | ICD-10-CM | POA: Insufficient documentation

## 2014-12-01 DIAGNOSIS — J45909 Unspecified asthma, uncomplicated: Secondary | ICD-10-CM | POA: Insufficient documentation

## 2014-12-01 DIAGNOSIS — S161XXA Strain of muscle, fascia and tendon at neck level, initial encounter: Secondary | ICD-10-CM | POA: Insufficient documentation

## 2014-12-01 DIAGNOSIS — M549 Dorsalgia, unspecified: Secondary | ICD-10-CM

## 2014-12-01 DIAGNOSIS — S0990XA Unspecified injury of head, initial encounter: Secondary | ICD-10-CM | POA: Insufficient documentation

## 2014-12-01 DIAGNOSIS — Y9389 Activity, other specified: Secondary | ICD-10-CM | POA: Insufficient documentation

## 2014-12-01 DIAGNOSIS — Y998 Other external cause status: Secondary | ICD-10-CM | POA: Insufficient documentation

## 2014-12-01 DIAGNOSIS — Z87891 Personal history of nicotine dependence: Secondary | ICD-10-CM | POA: Insufficient documentation

## 2014-12-01 DIAGNOSIS — Z8659 Personal history of other mental and behavioral disorders: Secondary | ICD-10-CM | POA: Insufficient documentation

## 2014-12-01 DIAGNOSIS — Z79899 Other long term (current) drug therapy: Secondary | ICD-10-CM | POA: Insufficient documentation

## 2014-12-01 DIAGNOSIS — Y92410 Unspecified street and highway as the place of occurrence of the external cause: Secondary | ICD-10-CM | POA: Insufficient documentation

## 2014-12-01 DIAGNOSIS — M419 Scoliosis, unspecified: Secondary | ICD-10-CM | POA: Insufficient documentation

## 2014-12-01 DIAGNOSIS — R55 Syncope and collapse: Secondary | ICD-10-CM | POA: Insufficient documentation

## 2014-12-01 HISTORY — DX: Anxiety disorder, unspecified: F41.9

## 2014-12-01 LAB — I-STAT BETA HCG BLOOD, ED (MC, WL, AP ONLY)

## 2014-12-01 MED ORDER — MORPHINE SULFATE 4 MG/ML IJ SOLN
4.0000 mg | Freq: Once | INTRAMUSCULAR | Status: DC
  Filled 2014-12-01: qty 1

## 2014-12-01 MED ORDER — IBUPROFEN 800 MG PO TABS
800.0000 mg | ORAL_TABLET | Freq: Once | ORAL | Status: AC
  Administered 2014-12-01: 800 mg via ORAL
  Filled 2014-12-01: qty 1

## 2014-12-01 MED ORDER — MORPHINE SULFATE 4 MG/ML IJ SOLN
4.0000 mg | Freq: Once | INTRAMUSCULAR | Status: AC
  Administered 2014-12-01: 4 mg via INTRAVENOUS
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
## 2014-12-01 NOTE — ED Notes (Signed)
Patient removed from LSB by EDP.
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
## 2014-12-01 NOTE — ED Notes (Signed)
C-collar taken off per EDP's instruction.

## 2015-01-15 ENCOUNTER — Emergency Department (HOSPITAL_COMMUNITY)
Admission: EM | Admit: 2015-01-15 | Discharge: 2015-01-15 | Disposition: A | Discharge status: Home / Self Care | Payer: Self-pay | Attending: Emergency Medicine | Admitting: Emergency Medicine

## 2015-01-15 ENCOUNTER — Emergency Department (HOSPITAL_COMMUNITY): Payer: Self-pay

## 2015-01-15 ENCOUNTER — Encounter (HOSPITAL_COMMUNITY): Payer: Self-pay

## 2015-01-15 DIAGNOSIS — J45901 Unspecified asthma with (acute) exacerbation: Secondary | ICD-10-CM | POA: Insufficient documentation

## 2015-01-15 DIAGNOSIS — Z8659 Personal history of other mental and behavioral disorders: Secondary | ICD-10-CM | POA: Insufficient documentation

## 2015-01-15 DIAGNOSIS — Z87891 Personal history of nicotine dependence: Secondary | ICD-10-CM | POA: Insufficient documentation

## 2015-01-15 DIAGNOSIS — J9801 Acute bronchospasm: Secondary | ICD-10-CM

## 2015-01-15 DIAGNOSIS — Z79899 Other long term (current) drug therapy: Secondary | ICD-10-CM | POA: Insufficient documentation

## 2015-01-15 DIAGNOSIS — Z8739 Personal history of other diseases of the musculoskeletal system and connective tissue: Secondary | ICD-10-CM | POA: Insufficient documentation

## 2015-01-15 MED ORDER — AZITHROMYCIN 250 MG PO TABS
500.0000 mg | ORAL_TABLET | Freq: Once | ORAL | Status: AC
  Administered 2015-01-15: 500 mg via ORAL
  Filled 2015-01-15: qty 2

## 2015-01-15 MED ORDER — GUAIFENESIN-CODEINE 100-10 MG/5ML PO SYRP: 10.0000 mL | ORAL_SOLUTION | Freq: Three times a day (TID) | ORAL | Status: DC | PRN

## 2015-01-15 MED ORDER — ALBUTEROL SULFATE HFA 108 (90 BASE) MCG/ACT IN AERS
2.0000 | INHALATION_SPRAY | Freq: Once | RESPIRATORY_TRACT | Status: AC
  Administered 2015-01-15: 2 via RESPIRATORY_TRACT
  Filled 2015-01-15: qty 6.7

## 2015-01-15 MED ORDER — IPRATROPIUM-ALBUTEROL 0.5-2.5 (3) MG/3ML IN SOLN
3.0000 mL | Freq: Once | RESPIRATORY_TRACT | Status: AC
  Administered 2015-01-15: 3 mL via RESPIRATORY_TRACT
  Filled 2015-01-15: qty 3

## 2015-01-15 MED ORDER — PREDNISONE 10 MG PO TABS: ORAL_TABLET | ORAL | Status: DC

## 2015-01-15 NOTE — ED Notes (Signed)
Pt c/o of "sinus soreness", with clear productive cough, stuffy nose which began 3 days ago.

## 2015-01-15 NOTE — ED Notes (Signed)
PA at bedside.

## 2015-01-15 NOTE — Discharge Instructions (Signed)

## 2015-01-17 NOTE — ED Provider Notes (Signed)
CSN: [id number]     Arrival date & time 01/15/15  1448 History   First MD Initiated Contact with Patient 01/15/15 1513     Chief Complaint  Patient presents with  . Cough     (Consider location/radiation/quality/duration/timing/severity/associated sxs/prior Treatment) HPI   Olivia Fuller is a 29 y.o. female who presents to the Emergency Department complaining of cough, sinus pressure, nasal congestion for 3 days.  She reports facial pressure and stuffy nose which developed into cough that is productive of clear sputum.  She has used her albuterol inhaler without relief.  She denies fever, shortness of breath, chest pain, or vomiting.   Nothing makes the symptoms better or worse.     Past Medical History  Diagnosis Date  . Asthma   . Scoliosis   . DDD (degenerative disc disease)   . Anxiety    Past Surgical History  Procedure Laterality Date  . No surgical history    . Lumbar laminectomy/decompression microdiscectomy  07/06/2012    Procedure: LUMBAR LAMINECTOMY/DECOMPRESSION MICRODISCECTOMY 1 LEVEL;  Surgeon: Cristi Loron, MD;  Location: [facility];  Service: Neurosurgery;  Laterality: Left;  LEFT Lumbar Five-Sacral One diskectomy  . Back surgery     History reviewed. No pertinent family history. History  Substance Use Topics  . Smoking status: Former Smoker -- 5 years    Quit date: 02/14/2012  . Smokeless tobacco: Not on file  . Alcohol Use: No   OB History    No data available     Review of Systems  Constitutional: Negative for fever, chills and appetite change.  HENT: Positive for congestion, rhinorrhea and sinus pressure. Negative for sore throat and trouble swallowing.   Respiratory: Positive for cough. Negative for chest tightness, shortness of breath and wheezing.   Cardiovascular: Negative for chest pain.  Gastrointestinal: Negative for nausea, vomiting and abdominal pain.  Genitourinary: Negative for dysuria.  Musculoskeletal: Negative for arthralgias.   Skin: Negative for rash.  Neurological: Negative for dizziness, weakness and numbness.  Hematological: Negative for adenopathy.  All other systems reviewed and are negative.     Allergies  Tylenol and Zofran  Home Medications   Prior to Admission medications   Medication Sig Start Date End Date Taking? Authorizing Provider  amphetamine-dextroamphetamine (ADDERALL) 20 MG tablet Take 20 mg by mouth 2 (two) times daily.    Yes Historical Provider, MD  albuterol (PROVENTIL HFA;VENTOLIN HFA) 108 (90 BASE) MCG/ACT inhaler Inhale 2 puffs into the lungs every 6 (six) hours as needed for wheezing.    Historical Provider, MD  guaiFENesin-codeine (ROBITUSSIN AC) 100-10 MG/5ML syrup Take 10 mLs by mouth 3 (three) times daily as needed. 01/15/15   Cordero Surette L. Akire Rennert, PA-C  predniSONE (DELTASONE) 10 MG tablet Take 6 tablets day one, 5 tablets day two, 4 tablets day three, 3 tablets day four, 2 tablets day five, then 1 tablet day six 01/15/15   Kierre Hintz L. Tacia Hindley, PA-C   BP 106/70 mmHg  Pulse 103  Temp(Src) 98 F (36.7 C) (Oral)  Resp 16  Ht 5' 7.5" (1.715 m)  Wt 170 lb (77.111 kg)  BMI 26.22 kg/m2  SpO2 100%  LMP 01/01/2015 Physical Exam  Constitutional: She is oriented to person, place, and time. She appears well-developed and well-nourished. No distress.  HENT:  Head: Normocephalic and atraumatic.  Right Ear: Tympanic membrane and ear canal normal.  Left Ear: Tympanic membrane and ear canal normal.  Nose: Mucosal edema and rhinorrhea present. Right sinus exhibits maxillary sinus  tenderness. Left sinus exhibits maxillary sinus tenderness.  Mouth/Throat: Uvula is midline, oropharynx is clear and moist and mucous membranes are normal. No oropharyngeal exudate.  Eyes: EOM are normal. Pupils are equal, round, and reactive to light.  Neck: Normal range of motion, full passive range of motion without pain and phonation normal. Neck supple.  Cardiovascular: Normal rate, regular rhythm, normal heart  sounds and intact distal pulses.   No murmur heard. Pulmonary/Chest: Effort normal. No stridor. No respiratory distress. She has wheezes. She has no rales. She exhibits no tenderness.  Coarse lungs sounds bilaterally with few expiratory wheezes  Musculoskeletal: Normal range of motion. She exhibits no edema.  Lymphadenopathy:    She has no cervical adenopathy.  Neurological: She is alert and oriented to person, place, and time. She exhibits normal muscle tone. Coordination normal.  Skin: Skin is warm and dry.  Nursing note and vitals reviewed.   ED Course  Procedures (including critical care time) Labs Review Labs Reviewed - No data to display  Imaging Review Dg Chest 2 View  01/15/2015   CLINICAL DATA:  Productive cough, headache, chest pain, sore throat for 3 days.  EXAM: CHEST  2 VIEW  COMPARISON:  01/17/2012  FINDINGS: The heart size and mediastinal contours are within normal limits. Both lungs are clear. The visualized skeletal structures are unremarkable.  IMPRESSION: No active cardiopulmonary disease.   Electronically Signed   By: Charlett NoseKevin  Dover M.D.   On: 01/15/2015 15:31     EKG Interpretation None      MDM   Final diagnoses:  Cough due to bronchospasm   Pt is well appearing, non-toxic.  Vital stable.  CXR neg for PNA.  clinical suspicion for PE is low.  Feels better after neb.  Albuterol inhaler dispensed.  Pt agree to symptomatic tx with prednisone and robitussin ac.  She agrees to close PMD f/u .  Also advised to return if sx's worsen.      Skyeler Smola L. Trisha Mangleriplett, PA-C 01/17/15 2329  Juliet RudeNathan R. Rubin PayorPickering, MD 01/18/15 1606

## 2015-01-24 LAB — OB RESULTS CONSOLE ABO/RH: RH Type: POSITIVE

## 2015-01-24 LAB — OB RESULTS CONSOLE RUBELLA ANTIBODY, IGM: RUBELLA: IMMUNE

## 2015-03-03 ENCOUNTER — Emergency Department (HOSPITAL_COMMUNITY): Payer: BLUE CROSS/BLUE SHIELD

## 2015-03-03 ENCOUNTER — Emergency Department (HOSPITAL_COMMUNITY)
Admission: EM | Admit: 2015-03-03 | Discharge: 2015-03-03 | Disposition: A | Discharge status: Home / Self Care | Payer: BLUE CROSS/BLUE SHIELD | Attending: Emergency Medicine | Admitting: Emergency Medicine

## 2015-03-03 ENCOUNTER — Other Ambulatory Visit: Payer: Self-pay

## 2015-03-03 ENCOUNTER — Encounter (HOSPITAL_COMMUNITY): Payer: Self-pay | Admitting: Emergency Medicine

## 2015-03-03 DIAGNOSIS — Z8739 Personal history of other diseases of the musculoskeletal system and connective tissue: Secondary | ICD-10-CM | POA: Diagnosis not present

## 2015-03-03 DIAGNOSIS — Z87891 Personal history of nicotine dependence: Secondary | ICD-10-CM | POA: Diagnosis not present

## 2015-03-03 DIAGNOSIS — R079 Chest pain, unspecified: Secondary | ICD-10-CM | POA: Diagnosis present

## 2015-03-03 DIAGNOSIS — Z79899 Other long term (current) drug therapy: Secondary | ICD-10-CM | POA: Insufficient documentation

## 2015-03-03 DIAGNOSIS — F419 Anxiety disorder, unspecified: Secondary | ICD-10-CM | POA: Diagnosis not present

## 2015-03-03 DIAGNOSIS — R0789 Other chest pain: Secondary | ICD-10-CM | POA: Insufficient documentation

## 2015-03-03 DIAGNOSIS — J45901 Unspecified asthma with (acute) exacerbation: Secondary | ICD-10-CM | POA: Diagnosis not present

## 2015-03-03 LAB — BASIC METABOLIC PANEL
Anion gap: 7 (ref 5–15)
BUN: 14 mg/dL (ref 6–23)
CO2: 23 mmol/L (ref 19–32)
Calcium: 8.8 mg/dL (ref 8.4–10.5)
Chloride: 107 mmol/L (ref 96–112)
Creatinine, Ser: 0.73 mg/dL (ref 0.50–1.10)
GFR calc Af Amer: >90 mL/min (ref >90)
GFR calc non Af Amer: >90 mL/min (ref >90)
Glucose, Bld: 77 mg/dL (ref 70–99)
Potassium: 3.8 mmol/L (ref 3.5–5.1)
Sodium: 137 mmol/L (ref 135–145)

## 2015-03-03 LAB — CBC
HCT: 38.1 % (ref 36.0–46.0)
Hemoglobin: 13 g/dL (ref 12.0–15.0)
MCH: 32.4 pg (ref 26.0–34.0)
MCHC: 34.1 g/dL (ref 30.0–36.0)
MCV: 95 fL (ref 78.0–100.0)
Platelets: 225 10*3/uL (ref 150–400)
RBC: 4.01 MIL/uL (ref 3.87–5.11)
RDW: 12.6 % (ref 11.5–15.5)
WBC: 7.2 10*3/uL (ref 4.0–10.5)

## 2015-03-03 LAB — TROPONIN I: Troponin I: <0.03 ng/mL (ref <0.031)

## 2015-03-03 MED ORDER — KETOROLAC TROMETHAMINE 30 MG/ML IJ SOLN
15.0000 mg | Freq: Once | INTRAMUSCULAR | Status: AC
  Administered 2015-03-03: 15 mg via INTRAVENOUS
  Filled 2015-03-03: qty 1

## 2015-03-03 MED ORDER — MORPHINE SULFATE 4 MG/ML IJ SOLN
6.0000 mg | Freq: Once | INTRAMUSCULAR | Status: AC
  Administered 2015-03-03: 6 mg via INTRAVENOUS
  Filled 2015-03-03: qty 2

## 2015-03-03 MED ORDER — LORAZEPAM 2 MG/ML IJ SOLN
1.0000 mg | Freq: Once | INTRAMUSCULAR | Status: AC
  Administered 2015-03-03: 1 mg via INTRAVENOUS
  Filled 2015-03-03: qty 1

## 2015-03-03 MED ORDER — TRAMADOL HCL 50 MG PO TABS: 50.0000 mg | ORAL_TABLET | Freq: Four times a day (QID) | ORAL | Status: DC | PRN

## 2015-03-03 NOTE — Discharge Instructions (Signed)

## 2015-03-03 NOTE — ED Provider Notes (Signed)
CSN: [id number]     Arrival date & time 03/03/15  1313 History  This chart was scribed for Olivia RazorStephen Jibreel Fedewa, MD by Tonye RoyaltyJoshua Chen, ED Scribe. This patient was seen in room APA09/APA09 and the patient's care was started at 2:13 PM.    Chief Complaint  Patient presents with  . Chest Pain   The history is provided by the patient. No language interpreter was used.    HPI Comments: Ola SpurrHeather M Fuller is a 29 y.o. 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 She states pain radiates down her entire left arm and reports associated SOB. She suspects it might be a panic attack; she states he usually has SOB and nervousness with panic attacks and states these symptoms are worse. She describes chest pain as if someone is sitting on her chest. She states movement and breathing does not make it better or worse. She has used inhaler and pain medication without improvement. She denies other medical history besides asthma. She notes her PCP does not prescribe her anything for her anxiety. She denies history of blood clots. She denies fever, chills, coughing, or leg swelling.  Past Medical History  Diagnosis Date  . Asthma   . Scoliosis   . DDD (degenerative disc disease)   . Anxiety    Past Surgical History  Procedure Laterality Date  . No surgical history    . Lumbar laminectomy/decompression microdiscectomy  07/06/2012    Procedure: LUMBAR LAMINECTOMY/DECOMPRESSION MICRODISCECTOMY 1 LEVEL;  Surgeon: Cristi LoronJeffrey D Jenkins, MD;  Location: [facility];  Service: Neurosurgery;  Laterality: Left;  LEFT Lumbar Five-Sacral One diskectomy  . Back surgery     History reviewed. No pertinent family history. History  Substance Use Topics  . Smoking status: Former Smoker -- 5 years    Quit date: 02/14/2012  . Smokeless tobacco: Not on file  . Alcohol Use: No   OB History    No data available     Review of Systems   Constitutional: Negative for fever and chills.  Respiratory: Positive for shortness of breath. Negative for cough.   Cardiovascular: Positive for chest pain. Negative for leg swelling.  Psychiatric/Behavioral: The patient is nervous/anxious.   All other systems reviewed and are negative.     Allergies  Tylenol and Zofran  Home Medications   Prior to Admission medications   Medication Sig Start Date End Date Taking? Authorizing Provider  albuterol (PROVENTIL HFA;VENTOLIN HFA) 108 (90 BASE) MCG/ACT inhaler Inhale 2 puffs into the lungs every 6 (six) hours as needed for wheezing.    Historical Provider, MD  amphetamine-dextroamphetamine (ADDERALL) 20 MG tablet Take 20 mg by mouth 2 (two) times daily.     Historical Provider, MD  guaiFENesin-codeine (ROBITUSSIN AC) 100-10 MG/5ML syrup Take 10 mLs by mouth 3 (three) times daily as needed. 01/15/15   Tammi Triplett, PA-C  predniSONE (DELTASONE) 10 MG tablet Take 6 tablets day one, 5 tablets day two, 4 tablets day three, 3 tablets day four, 2 tablets day five, then 1 tablet day six 01/15/15   Tammi Triplett, PA-C   BP 115/56 mmHg  Pulse 74  Temp(Src) 97.9 F (36.6 C) (Oral)  Resp 18  Ht 5\' 7"  (1.702 m)  Wt 174 lb (78.926 kg)  BMI 27.25 kg/m2  SpO2 100%  LMP 02/02/2015 Physical Exam  Constitutional: She appears well-developed and well-nourished. No distress.  HENT:  Head: Normocephalic and atraumatic.  Eyes: Conjunctivae are normal. Right eye exhibits no discharge. Left eye exhibits no discharge.  Neck: Neck supple.  Cardiovascular: Normal rate, regular rhythm and normal heart sounds.  Exam reveals no gallop and no friction rub.   No murmur heard. Pulmonary/Chest: Effort normal and breath sounds normal. No respiratory distress. She exhibits no tenderness.  Abdominal: Soft. She exhibits no distension. There is no tenderness.  Musculoskeletal: She exhibits no edema or tenderness.  Lower extremities symmetric as compared to each other.  No calf tenderness. Negative Homan's. No palpable cords.    Neurological: She is alert.  Skin: Skin is warm and dry.  Psychiatric: She has a normal mood and affect. Her behavior is normal. Thought content normal.  Nursing note and vitals reviewed.   ED Course  Procedures (including critical care time)  DIAGNOSTIC STUDIES: Oxygen Saturation is 100% on room air, normal by my interpretation.    COORDINATION OF CARE: 2:13 PM Discussed treatment plan with patient at beside, the patient agrees with the plan and has no further questions at this time.   Labs Review Labs Reviewed  CBC  BASIC METABOLIC PANEL  TROPONIN I    Imaging Review Dg Chest 2 View  03/03/2015   CLINICAL DATA:  Chest pain for 1 day. Intermittent difficulty breathing  EXAM: CHEST  2 VIEW  COMPARISON:  January 15, 2015  FINDINGS: There is no edema or consolidation. Heart size and pulmonary vascularity are normal. No adenopathy. There is upper thoracic levoscoliosis.  IMPRESSION: No edema or consolidation.   Electronically Signed   By: Bretta Bang III M.D.   On: 03/03/2015 13:37     EKG Interpretation   Date/Time:  Monday March 03 2015 13:17:04 EDT Ventricular Rate:  72 PR Interval:  106 QRS Duration: 92 QT Interval:  392 QTC Calculation: 429 R Axis:   21 Text Interpretation:  Sinus rhythm with short PR Otherwise normal ECG  Confirmed by Juleen China  MD, Nealy Karapetian ([id number]) on 03/03/2015 3:33:18 PM      MDM   Final diagnoses:  Chest pain, unspecified chest pain type    29 year old female left sided chest pain. Atypical for ACS. Normal-appearing EKG. No significant risk factors. Doubt pulmonary embolism. Doubt dissection. Chest x-ray without acute abnormality. Afebrile. HD stable. No increased work of breathing. Normal oxygen saturations on room air.  I personally preformed the services sare within cribed in my presence. The recorded information has been reviewed is accurate. Olivia Razor, MD.   Olivia Razor, MD 03/05/15 [phone]

## 2015-03-03 NOTE — ED Notes (Signed)
Pt reports chest pain since last night. Pt reports intermittent sob,dizziness. nad noted. Pt reports pain worse with movement.

## 2015-03-03 NOTE — ED Notes (Signed)
Pt states she has been under stress at work lately and feels this CP could be coming from that.

## 2015-04-22 ENCOUNTER — Other Ambulatory Visit: Payer: Self-pay | Admitting: Sports Medicine

## 2015-04-22 DIAGNOSIS — T148XXA Other injury of unspecified body region, initial encounter: Secondary | ICD-10-CM

## 2015-04-25 DIAGNOSIS — M25532 Pain in left wrist: Secondary | ICD-10-CM | POA: Insufficient documentation

## 2015-04-26 ENCOUNTER — Emergency Department (HOSPITAL_COMMUNITY): Payer: BLUE CROSS/BLUE SHIELD

## 2015-04-26 ENCOUNTER — Encounter (HOSPITAL_COMMUNITY): Payer: Self-pay | Admitting: *Deleted

## 2015-04-26 ENCOUNTER — Emergency Department (HOSPITAL_COMMUNITY)
Admission: EM | Admit: 2015-04-26 | Discharge: 2015-04-26 | Disposition: A | Discharge status: Home / Self Care | Payer: BLUE CROSS/BLUE SHIELD | Attending: Emergency Medicine | Admitting: Emergency Medicine

## 2015-04-26 DIAGNOSIS — Z8659 Personal history of other mental and behavioral disorders: Secondary | ICD-10-CM | POA: Diagnosis not present

## 2015-04-26 DIAGNOSIS — Z8739 Personal history of other diseases of the musculoskeletal system and connective tissue: Secondary | ICD-10-CM | POA: Diagnosis not present

## 2015-04-26 DIAGNOSIS — X58XXXA Exposure to other specified factors, initial encounter: Secondary | ICD-10-CM | POA: Diagnosis not present

## 2015-04-26 DIAGNOSIS — Z79899 Other long term (current) drug therapy: Secondary | ICD-10-CM | POA: Insufficient documentation

## 2015-04-26 DIAGNOSIS — M25532 Pain in left wrist: Secondary | ICD-10-CM

## 2015-04-26 DIAGNOSIS — J45909 Unspecified asthma, uncomplicated: Secondary | ICD-10-CM | POA: Diagnosis not present

## 2015-04-26 DIAGNOSIS — Z87891 Personal history of nicotine dependence: Secondary | ICD-10-CM | POA: Insufficient documentation

## 2015-04-26 DIAGNOSIS — Y990 Civilian activity done for income or pay: Secondary | ICD-10-CM | POA: Insufficient documentation

## 2015-04-26 DIAGNOSIS — S63502A Unspecified sprain of left wrist, initial encounter: Secondary | ICD-10-CM | POA: Insufficient documentation

## 2015-04-26 DIAGNOSIS — S6992XA Unspecified injury of left wrist, hand and finger(s), initial encounter: Secondary | ICD-10-CM | POA: Diagnosis present

## 2015-04-26 DIAGNOSIS — Y9289 Other specified places as the place of occurrence of the external cause: Secondary | ICD-10-CM | POA: Diagnosis not present

## 2015-04-26 DIAGNOSIS — Y9389 Activity, other specified: Secondary | ICD-10-CM | POA: Insufficient documentation

## 2015-04-26 MED ORDER — IBUPROFEN 800 MG PO TABS
800.0000 mg | ORAL_TABLET | Freq: Once | ORAL | Status: AC
  Administered 2015-04-26: 800 mg via ORAL
  Filled 2015-04-26: qty 1
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
## 2015-05-11 ENCOUNTER — Encounter (HOSPITAL_COMMUNITY): Payer: Self-pay | Admitting: Emergency Medicine

## 2015-05-11 ENCOUNTER — Emergency Department (HOSPITAL_COMMUNITY)
Admission: EM | Admit: 2015-05-11 | Discharge: 2015-05-11 | Disposition: A | Discharge status: Home / Self Care | Payer: Worker's Compensation | Attending: Emergency Medicine | Admitting: Emergency Medicine

## 2015-05-11 DIAGNOSIS — S59912S Unspecified injury of left forearm, sequela: Secondary | ICD-10-CM | POA: Diagnosis present

## 2015-05-11 DIAGNOSIS — S66912S Strain of unspecified muscle, fascia and tendon at wrist and hand level, left hand, sequela: Secondary | ICD-10-CM | POA: Insufficient documentation

## 2015-05-11 DIAGNOSIS — Z79899 Other long term (current) drug therapy: Secondary | ICD-10-CM | POA: Insufficient documentation

## 2015-05-11 DIAGNOSIS — Z87891 Personal history of nicotine dependence: Secondary | ICD-10-CM | POA: Diagnosis not present

## 2015-05-11 DIAGNOSIS — X58XXXD Exposure to other specified factors, subsequent encounter: Secondary | ICD-10-CM | POA: Insufficient documentation

## 2015-05-11 DIAGNOSIS — J45909 Unspecified asthma, uncomplicated: Secondary | ICD-10-CM | POA: Insufficient documentation

## 2015-05-11 DIAGNOSIS — Z8659 Personal history of other mental and behavioral disorders: Secondary | ICD-10-CM | POA: Insufficient documentation

## 2015-05-11 DIAGNOSIS — S66912D Strain of unspecified muscle, fascia and tendon at wrist and hand level, left hand, subsequent encounter: Secondary | ICD-10-CM

## 2015-05-11 NOTE — ED Provider Notes (Signed)
CSN: [id number]     Arrival date & time 05/11/15  1537 History  This chart was scribed for non-physician provider Ivery Quale, PA-C, working with Blane Ohara, MD by Phillis Haggis, ED Scribe. This patient was seen in room APFT22/APFT22 and patient care was started at 4:12 PM.   Chief Complaint  Patient presents with  . Arm Injury   Patient is a 29 y.o. female presenting with arm injury. The history is provided by the patient. No language interpreter was used.  Arm Injury Location:  Wrist Time since incident:  2 weeks Injury: yes   Wrist location:  L wrist Pain details:    Quality:  Tingling   Radiates to:  Does not radiate   Severity:  Moderate   Duration:  2 weeks   Progression:  Worsening Chronicity:  New Worsened by:  Movement Associated symptoms: numbness and tingling   HPI Comments: Olivia Fuller is a 29 y.o. female who presents to the Emergency Department complaining of a left wrist injury onset earlier today. She states that she was seen 2 weeks ago for left wrist pain and was diagnosed with a sprain. She states that she was supposed to be on light duty, as written by the last physician she saw, and was doing heavy lifting, which exacerbated her injury. She reports that she cannot feel her fingers and that there is tingling. She states that her work is supposed to schedule her an appointment with an orthopedist on Monday. She was sent home from work to get a note from the doctor. Pt wears an immobilizing brace on the left wrist.    Past Medical History  Diagnosis Date  . Asthma   . Scoliosis   . DDD (degenerative disc disease)   . Anxiety    Past Surgical History  Procedure Laterality Date  . No surgical history    . Lumbar laminectomy/decompression microdiscectomy  07/06/2012    Procedure: LUMBAR LAMINECTOMY/DECOMPRESSION MICRODISCECTOMY 1 LEVEL;  Surgeon: Cristi Loron, MD;  Location: [facility];  Service: Neurosurgery;  Laterality: Left;  LEFT Lumbar Five-Sacral  One diskectomy  . Back surgery     History reviewed. No pertinent family history. History  Substance Use Topics  . Smoking status: Former Smoker -- 5 years    Quit date: 02/14/2012  . Smokeless tobacco: Not on file  . Alcohol Use: No   OB History    No data available     Review of Systems  Musculoskeletal: Positive for arthralgias.  Neurological: Positive for weakness and numbness.  All other systems reviewed and are negative.  Allergies  Tylenol and Zofran  Home Medications   Prior to Admission medications   Medication Sig Start Date End Date Taking? Authorizing Provider  amphetamine-dextroamphetamine (ADDERALL) 20 MG tablet Take 20 mg by mouth 2 (two) times daily.    Yes Historical Provider, MD  cholecalciferol (VITAMIN D) 1000 UNITS tablet Take 1,000 Units by mouth daily.   Yes Historical Provider, MD  Oxycodone HCl 10 MG TABS Take 10 mg by mouth every 8 (eight) hours.   Yes Historical Provider, MD  Prenatal Vit-Fe Fumarate-FA (PRENATAL MULTIVITAMIN) TABS tablet Take 1 tablet by mouth daily at 12 noon.   Yes Historical Provider, MD  albuterol (PROVENTIL HFA;VENTOLIN HFA) 108 (90 BASE) MCG/ACT inhaler Inhale 2 puffs into the lungs every 6 (six) hours as needed for wheezing.    Historical Provider, MD   BP 114/70 mmHg  Pulse 74  Temp(Src) 97.9 F (36.6 C) (Oral)  Resp 20  Ht 5\' 7"  (1.702 m)  Wt 174 lb (78.926 kg)  BMI 27.25 kg/m2  SpO2 100%  LMP 04/23/2015  Physical Exam  Constitutional: She is oriented to person, place, and time. She appears well-developed and well-nourished.  HENT:  Head: Normocephalic and atraumatic.  Mouth/Throat: Oropharynx is clear and moist.  Eyes: Conjunctivae and EOM are normal.  Neck: Normal range of motion. Neck supple.  Cardiovascular: Normal rate, regular rhythm and normal heart sounds.   Pulses:      Radial pulses are 2+ on the left side.  Brachial pulses 2+, cap refills < 2 secs, no temp change with right and left   Pulmonary/Chest: Effort normal and breath sounds normal.  Musculoskeletal: Normal range of motion. She exhibits no edema.  Full ROM of left shoulder, no dislocation. No deformity or effusion of left elbow, no hot area. No palpable deformity, no atrophy of thenar eminence, pain with ROM of left wrist   Neurological: She is alert and oriented to person, place, and time. No sensory deficit.  Grip is symmetrical  Skin: Skin is warm and dry.  Psychiatric: She has a normal mood and affect. Her behavior is normal.  Nursing note and vitals reviewed.   ED Course  Procedures (including critical care time) DIAGNOSTIC STUDIES: Oxygen Saturation is 100% on room air, normal by my interpretation.    COORDINATION OF CARE: 4:19 PM-Discussed treatment plan which includes work note that indicates light duty until seen by orthopedist with pt at bedside and pt agreed to plan.   Labs Review Labs Reviewed - No data to display  Imaging Review No results found.   EKG Interpretation None      MDM  Pt injured the left wrist about 5/21. She has been ask to lift, and have duties above light duty status per pt, and feels she has re-injured the left wrist. She request another work note that she should have light duty until seen by the orthopedic MD. This is being arranged by her job. No neuro-vascular changes. No physical deformity. Safe for pt to be discharged home. She will continue the splint. Work note given.   Final diagnoses:  None    **I have reviewed nursing notes, vital signs, and all appropriate lab and imaging results for this patient.* **I personally performed the services described in this documentation, which was scribed in my presence. The recorded information has been reviewed and is accurate.Ivery Quale*   Taffy Delconte, PA-C 05/11/15 1629  Blane OharaJoshua Zavitz, MD 05/12/15 [phone]

## 2015-05-11 NOTE — ED Notes (Signed)
Pt states that she thinks she reinjured her left wrist at work.  Was dx with sprain about 2 weeks ago and states she doesn't think that it is any better.

## 2015-05-11 NOTE — Discharge Instructions (Signed)
Please rest your wrist and arm as much as possible. See the orthopedic specialist concerning this as soon as possible. Please continue to use your splint until seen by orthopedics. Wrist Pain A wrist sprain happens when the bands of tissue that hold the wrist joints together (ligament) stretch too much or tear. A wrist strain happens when muscles or bands of tissue that connect muscles to bones (tendons) are stretched or pulled. HOME CARE  Put ice on the injured area.  Put ice in a plastic bag.  Place a towel between your skin and the bag.  Leave the ice on for 15-20 minutes, 03-04 times a day, for the first 2 days.  Raise (elevate) the injured wrist to lessen puffiness (swelling).  Rest the injured wrist for at least 48 hours or as told by your doctor.  Wear a splint, cast, or an elastic wrap as told by your doctor.  Only take medicine as told by your doctor.  Follow up with your doctor as told. This is important. GET HELP RIGHT AWAY IF:   The fingers are puffy, very red, white, or cold and blue.  The fingers lose feeling (numb) or tingle.  The pain gets worse.  It is hard to move the fingers. MAKE SURE YOU:   Understand these instructions.  Will watch your condition.  Will get help right away if you are not doing well or get worse. Document Released: 05/10/2008 Document Revised: 02/14/2012 Document Reviewed: 01/13/2011 [facility] Patient Information 2015 [place], [place]. This information is not intended to replace advice given to you by your health care provider. Make sure you discuss any questions you have with your health care provider.

## 2015-05-13 ENCOUNTER — Other Ambulatory Visit: Payer: Self-pay

## 2015-05-13 ENCOUNTER — Other Ambulatory Visit: Payer: Self-pay | Admitting: Orthopaedic Surgery

## 2015-05-13 ENCOUNTER — Inpatient Hospital Stay: Admission: RE | Admit: 2015-05-13 | Payer: Self-pay | Source: Ambulatory Visit

## 2015-05-13 DIAGNOSIS — T148XXA Other injury of unspecified body region, initial encounter: Secondary | ICD-10-CM

## 2015-05-30 ENCOUNTER — Other Ambulatory Visit: Payer: Self-pay

## 2015-06-18 ENCOUNTER — Encounter (HOSPITAL_COMMUNITY): Payer: Self-pay | Admitting: Emergency Medicine

## 2015-06-18 ENCOUNTER — Emergency Department (HOSPITAL_COMMUNITY)
Admission: EM | Admit: 2015-06-18 | Discharge: 2015-06-18 | Disposition: A | Discharge status: Home / Self Care | Payer: BLUE CROSS/BLUE SHIELD | Attending: Emergency Medicine | Admitting: Emergency Medicine

## 2015-06-18 DIAGNOSIS — Z3202 Encounter for pregnancy test, result negative: Secondary | ICD-10-CM | POA: Diagnosis not present

## 2015-06-18 DIAGNOSIS — N760 Acute vaginitis: Secondary | ICD-10-CM | POA: Insufficient documentation

## 2015-06-18 DIAGNOSIS — Z8739 Personal history of other diseases of the musculoskeletal system and connective tissue: Secondary | ICD-10-CM | POA: Diagnosis not present

## 2015-06-18 DIAGNOSIS — J45909 Unspecified asthma, uncomplicated: Secondary | ICD-10-CM | POA: Insufficient documentation

## 2015-06-18 DIAGNOSIS — Z87891 Personal history of nicotine dependence: Secondary | ICD-10-CM | POA: Diagnosis not present

## 2015-06-18 DIAGNOSIS — Z79899 Other long term (current) drug therapy: Secondary | ICD-10-CM | POA: Insufficient documentation

## 2015-06-18 DIAGNOSIS — F419 Anxiety disorder, unspecified: Secondary | ICD-10-CM | POA: Insufficient documentation

## 2015-06-18 DIAGNOSIS — B9689 Other specified bacterial agents as the cause of diseases classified elsewhere: Secondary | ICD-10-CM

## 2015-06-18 DIAGNOSIS — R109 Unspecified abdominal pain: Secondary | ICD-10-CM | POA: Diagnosis present

## 2015-06-18 LAB — WET PREP, GENITAL
Trich, Wet Prep: NONE SEEN
YEAST WET PREP: NONE SEEN

## 2015-06-18 LAB — URINALYSIS, ROUTINE W REFLEX MICROSCOPIC
Bilirubin Urine: NEGATIVE
Glucose, UA: NEGATIVE mg/dL
Hgb urine dipstick: NEGATIVE
Ketones, ur: NEGATIVE mg/dL
LEUKOCYTES UA: NEGATIVE
NITRITE: NEGATIVE
PH: 7.5 (ref 5.0–8.0)
Protein, ur: NEGATIVE mg/dL
SPECIFIC GRAVITY, URINE: 1.015 (ref 1.005–1.030)
UROBILINOGEN UA: 0.2 mg/dL (ref 0.0–1.0)

## 2015-06-18 LAB — POC URINE PREG, ED: PREG TEST UR: NEGATIVE

## 2015-06-18 MED ORDER — METRONIDAZOLE 500 MG PO TABS
500.0000 mg | ORAL_TABLET | Freq: Two times a day (BID) | ORAL | Status: DC
Start: 2015-06-18 — End: 2015-12-16

## 2015-06-18 NOTE — Discharge Instructions (Signed)
Bacterial Vaginosis Bacterial vaginosis is a vaginal infection that occurs when the normal balance of bacteria in the vagina is disrupted. It results from an overgrowth of certain bacteria. This is the most common vaginal infection in women of childbearing age. Treatment is important to prevent complications, especially in pregnant women, as it can cause a premature delivery. CAUSES  Bacterial vaginosis is caused by an increase in harmful bacteria that are normally present in smaller amounts in the vagina. Several different kinds of bacteria can cause bacterial vaginosis. However, the reason that the condition develops is not fully understood. RISK FACTORS Certain activities or behaviors can put you at an increased risk of developing bacterial vaginosis, including:  Having a new sex partner or multiple sex partners.  Douching.  Using an intrauterine device (IUD) for contraception. Women do not get bacterial vaginosis from toilet seats, bedding, swimming pools, or contact with objects around them. SIGNS AND SYMPTOMS  Some women with bacterial vaginosis have no signs or symptoms. Common symptoms include:  Grey vaginal discharge.  A fishlike odor with discharge, especially after sexual intercourse.  Itching or burning of the vagina and vulva.  Burning or pain with urination. DIAGNOSIS  Your health care provider will take a medical history and examine the vagina for signs of bacterial vaginosis. A sample of vaginal fluid may be taken. Your health care provider will look at this sample under a microscope to check for bacteria and abnormal cells. A vaginal pH test may also be done.  TREATMENT  Bacterial vaginosis may be treated with antibiotic medicines. These may be given in the form of a pill or a vaginal cream. A second round of antibiotics may be prescribed if the condition comes back after treatment.  HOME CARE INSTRUCTIONS   Only take over-the-counter or prescription medicines as  directed by your health care provider.  If antibiotic medicine was prescribed, take it as directed. Make sure you finish it even if you start to feel better.  Do not have sex until treatment is completed.  Tell all sexual partners that you have a vaginal infection. They should see their health care provider and be treated if they have problems, such as a mild rash or itching.  Practice safe sex by using condoms and only having one sex partner. SEEK MEDICAL CARE IF:   Your symptoms are not improving after 3 days of treatment.  You have increased discharge or pain.  You have a fever. MAKE SURE YOU:   Understand these instructions.  Will watch your condition.  Will get help right away if you are not doing well or get worse. FOR MORE INFORMATION  Centers for Disease Control and Prevention, Division of STD Prevention: www.cdc.gov/std American Sexual Health Association (ASHA): www.ashastd.org  Document Released: 11/22/2005 Document Revised: 09/12/2013 Document Reviewed: 07/04/2013 Exit[employer] Patient Information 2015 [employer]. This information is not intended to replace advice given to you by your health care provider. Make sure you discuss any questions you have with your health care provider.  

## 2015-06-18 NOTE — ED Notes (Signed)
Multiple complaints. Pt reports irregular periods since June 26th, lower abd pain,wt loss,headaches,lower back pain, intermittent "brown" vaginal discharge. Pt reports took two at home pregnancy tests with a negative result. nad noted.

## 2015-06-18 NOTE — ED Provider Notes (Signed)
CSN: [id number]     Arrival date & time 06/18/15  [id number] History   First MD Initiated Contact with Patient 06/18/15 [phone]     Chief Complaint  Patient presents with  . Abdominal Pain     (Consider location/radiation/quality/duration/timing/severity/associated sxs/prior Treatment) The history is provided by the patient.   Olivia Fuller is a 29 y.o. female with a past medical history of asthma, scoliosis and anxiety who is currently [facility] presenting with a 2 week history of intermittent vaginal spotting along with mild menstrual cramping and low back pain.  She denies abdominal pain, nausea, vomiting fevers or chills.  She has taken 2 home pregnancy tests this week, both were negative.  Her last normal regular cycle.  Started on June 26, was lighter than normal and only lasted 3 days.  She denies dysuria, hematuria or other complaint.  She does have history of chronic low back pain.     Past Medical History  Diagnosis Date  . Asthma   . Scoliosis   . DDD (degenerative disc disease)   . Anxiety    Past Surgical History  Procedure Laterality Date  . No surgical history    . Lumbar laminectomy/decompression microdiscectomy  07/06/2012    Procedure: LUMBAR LAMINECTOMY/DECOMPRESSION MICRODISCECTOMY 1 LEVEL;  Surgeon: Cristi LoronJeffrey D Jenkins, MD;  Location: [facility];  Service: Neurosurgery;  Laterality: Left;  LEFT Lumbar Five-Sacral One diskectomy  . Back surgery     History reviewed. No pertinent family history. History  Substance Use Topics  . Smoking status: Former Smoker -- 5 years    Quit date: 02/14/2012  . Smokeless tobacco: Not on file  . Alcohol Use: No   OB History    No data available     Review of Systems  Constitutional: Negative for fever and chills.  HENT: Negative for congestion and sore throat.   Eyes: Negative.   Respiratory: Negative for chest tightness and shortness of breath.   Cardiovascular: Negative for  chest pain.  Gastrointestinal: Negative for nausea, vomiting and abdominal pain.  Genitourinary: Positive for menstrual problem. Negative for dysuria, urgency, flank pain and vaginal pain.  Musculoskeletal: Positive for back pain. Negative for joint swelling, arthralgias and neck pain.  Skin: Negative.  Negative for rash and wound.  Neurological: Negative for dizziness, weakness, light-headedness, numbness and headaches.  Psychiatric/Behavioral: Negative.       Allergies  Tylenol and Zofran  Home Medications   Prior to Admission medications   Medication Sig Start Date End Date Taking? Authorizing Provider  albuterol (PROVENTIL HFA;VENTOLIN HFA) 108 (90 BASE) MCG/ACT inhaler Inhale 2 puffs into the lungs every 6 (six) hours as needed for wheezing.   Yes Historical Provider, MD  amphetamine-dextroamphetamine (ADDERALL) 20 MG tablet Take 20 mg by mouth 2 (two) times daily.    Yes Historical Provider, MD  cholecalciferol (VITAMIN D) 1000 UNITS tablet Take 1,000 Units by mouth daily.   Yes Historical Provider, MD  Oxycodone HCl 10 MG TABS Take 10 mg by mouth every 8 (eight) hours.   Yes Historical Provider, MD  Prenatal Vit-Fe Fumarate-FA (PRENATAL MULTIVITAMIN) TABS tablet Take 1 tablet by mouth daily at 12 noon.   Yes Historical Provider, MD  metroNIDAZOLE (FLAGYL) 500 MG tablet Take 1 tablet (500 mg total) by mouth 2 (two) times daily. 06/18/15   Burgess AmorJulie Kaelyn Nauta, PA-C   BP 118/75 mmHg  Pulse 98  Temp(Src) 98.1 F (36.7 C) (Oral)  Resp 18  Ht   (1.702 m)  Wt 173 lb (78.472 kg)  BMI 27.09 kg/m2  SpO2 100%  LMP 06/01/2015 Physical Exam  Constitutional: She appears well-developed and well-nourished.  HENT:  Head: Normocephalic and atraumatic.  Eyes: Conjunctivae are normal.  Neck: Normal range of motion.  Cardiovascular: Normal rate, regular rhythm, normal heart sounds and intact distal pulses.   Pulmonary/Chest: Effort normal and breath sounds normal. She has no wheezes.   Abdominal: Soft. Bowel sounds are normal. There is no tenderness.  Genitourinary: Vagina normal and uterus normal. Uterus is not tender. Cervix exhibits no motion tenderness, no discharge and no friability. Right adnexum displays no mass, no tenderness and no fullness. Left adnexum displays no mass, no tenderness and no fullness. No bleeding in the vagina. No vaginal discharge found.  Musculoskeletal: Normal range of motion.  Neurological: She is alert.  Skin: Skin is warm and dry.  Psychiatric: She has a normal mood and affect.  Nursing note and vitals reviewed.   ED Course  Procedures (including critical care time) Labs Review Labs Reviewed  WET PREP, GENITAL - Abnormal; Notable for the following:    Clue Cells Wet Prep HPF POC MANY (*)    WBC, Wet Prep HPF POC RARE (*)    All other components within normal limits  URINALYSIS, ROUTINE W REFLEX MICROSCOPIC (NOT AT [facility])  POC URINE PREG, ED  GC/CHLAMYDIA PROBE AMP ([place]) NOT AT [facility]    Imaging Review No results found.   EKG Interpretation None      MDM   Final diagnoses:  Bacterial vaginosis    Patient with bacterial vaginosis.  She was prescribed Flagyl.  Encouraged follow-up with her PCP or her fertility specialist at [facility] for further management as needed.  Patient is aware that GC and Chlamydia cultures are pending at this time.  She has no cervical motion tenderness to suggest this infection.    Burgess Amor, PA-C 06/18/15 1610  Vanetta Mulders, MD 06/19/15 [phone]

## 2015-06-19 LAB — GC/CHLAMYDIA PROBE AMP (~~LOC~~) NOT AT ARMC
CHLAMYDIA, DNA PROBE: NEGATIVE
NEISSERIA GONORRHEA: NEGATIVE

## 2015-10-24 ENCOUNTER — Encounter (HOSPITAL_COMMUNITY): Payer: Self-pay | Admitting: Emergency Medicine

## 2015-10-24 ENCOUNTER — Emergency Department (HOSPITAL_COMMUNITY)
Admission: EM | Admit: 2015-10-24 | Discharge: 2015-10-24 | Disposition: A | Discharge status: Home / Self Care | Payer: BLUE CROSS/BLUE SHIELD | Attending: Emergency Medicine | Admitting: Emergency Medicine

## 2015-10-24 DIAGNOSIS — Z79899 Other long term (current) drug therapy: Secondary | ICD-10-CM | POA: Diagnosis not present

## 2015-10-24 DIAGNOSIS — Z87891 Personal history of nicotine dependence: Secondary | ICD-10-CM | POA: Diagnosis not present

## 2015-10-24 DIAGNOSIS — J069 Acute upper respiratory infection, unspecified: Secondary | ICD-10-CM | POA: Insufficient documentation

## 2015-10-24 DIAGNOSIS — O9989 Other specified diseases and conditions complicating pregnancy, childbirth and the puerperium: Secondary | ICD-10-CM | POA: Diagnosis present

## 2015-10-24 DIAGNOSIS — Z3490 Encounter for supervision of normal pregnancy, unspecified, unspecified trimester: Secondary | ICD-10-CM

## 2015-10-24 DIAGNOSIS — O99511 Diseases of the respiratory system complicating pregnancy, first trimester: Secondary | ICD-10-CM | POA: Diagnosis not present

## 2015-10-24 DIAGNOSIS — Z3A01 Less than 8 weeks gestation of pregnancy: Secondary | ICD-10-CM | POA: Diagnosis not present

## 2015-10-24 DIAGNOSIS — Z8659 Personal history of other mental and behavioral disorders: Secondary | ICD-10-CM | POA: Insufficient documentation

## 2015-10-24 DIAGNOSIS — Z792 Long term (current) use of antibiotics: Secondary | ICD-10-CM | POA: Insufficient documentation

## 2015-10-24 DIAGNOSIS — Z8739 Personal history of other diseases of the musculoskeletal system and connective tissue: Secondary | ICD-10-CM | POA: Diagnosis not present

## 2015-10-24 DIAGNOSIS — J45909 Unspecified asthma, uncomplicated: Secondary | ICD-10-CM | POA: Insufficient documentation

## 2015-10-24 LAB — POC URINE PREG, ED: Preg Test, Ur: POSITIVE — AB

## 2015-10-24 MED ORDER — PRENATAL 27-0.8 MG PO TABS: 1.0000 | ORAL_TABLET | Freq: Every day | ORAL | Status: DC

## 2015-10-24 NOTE — Discharge Instructions (Signed)
Cough, Adult Coughing is a reflex that clears your throat and your airways. Coughing helps to heal and protect your lungs. It is normal to cough occasionally, but a cough that happens with other symptoms or lasts a long time may be a sign of a condition that needs treatment. A cough may last only 2-3 weeks (acute), or it may last longer than 8 weeks (chronic). CAUSES Coughing is commonly caused by:  Breathing in substances that irritate your lungs.  A viral or bacterial respiratory infection.  Allergies.  Asthma.  Postnasal drip.  Smoking.  Acid backing up from the stomach into the esophagus (gastroesophageal reflux).  Certain medicines.  Chronic lung problems, including COPD (or rarely, lung cancer).  Other medical conditions such as heart failure. HOME CARE INSTRUCTIONS  Pay attention to any changes in your symptoms. Take these actions to help with your discomfort:  Take medicines only as told by your health care provider.  If you were prescribed an antibiotic medicine, take it as told by your health care provider. Do not stop taking the antibiotic even if you start to feel better.  Talk with your health care provider before you take a cough suppressant medicine.  Drink enough fluid to keep your urine clear or pale yellow.  If the air is dry, use a cold steam vaporizer or humidifier in your bedroom or your home to help loosen secretions.  Avoid anything that causes you to cough at work or at home.  If your cough is worse at night, try sleeping in a semi-upright position.  Avoid cigarette smoke. If you smoke, quit smoking. If you need help quitting, ask your health care provider.  Avoid caffeine.  Avoid alcohol.  Rest as needed. SEEK MEDICAL CARE IF:   You have new symptoms.  You cough up pus.  Your cough does not get better after 2-3 weeks, or your cough gets worse.  You cannot control your cough with suppressant medicines and you are losing sleep.  You  develop pain that is getting worse or pain that is not controlled with pain medicines.  You have a fever.  You have unexplained weight loss.  You have night sweats. SEEK IMMEDIATE MEDICAL CARE IF:  You cough up blood.  You have difficulty breathing.  Your heartbeat is very fast.   This information is not intended to replace advice given to you by your health care provider. Make sure you discuss any questions you have with your health care provider.   Document Released: 05/21/2011 Document Revised: 08/13/2015 Document Reviewed: 01/29/2015 Elsevier Interactive Patient Education 2016 [employer].  Prenatal Care WHAT IS PRENATAL CARE?  Prenatal care is the process of caring for a pregnant woman before she gives birth. Prenatal care makes sure that she and her baby remain as healthy as possible throughout pregnancy. Prenatal care may be provided by a midwife, family practice health care provider, or a childbirth and pregnancy specialist (obstetrician). Prenatal care may include physical examinations, testing, treatments, and education on nutrition, lifestyle, and social support services. WHY IS PRENATAL CARE SO IMPORTANT?  Early and consistent prenatal care increases the chance that you and your baby will remain healthy throughout your pregnancy. This type of care also decreases a baby's risk of being born too early (prematurely), or being born smaller than expected (small for gestational age). Any underlying medical conditions you may have that could pose a risk during your pregnancy are discussed during prenatal care visits. You will also be monitored regularly for any  new conditions that may arise during your pregnancy so they can be treated quickly and effectively. WHAT HAPPENS DURING PRENATAL CARE VISITS? Prenatal care visits may include the following: Discussion Tell your health care provider about any new signs or symptoms you have experienced since your last visit. These might  include:  Nausea or vomiting.  Increased or decreased level of energy.  Difficulty sleeping.  Back or leg pain.  Weight changes.  Frequent urination.  Shortness of breath with physical activity.  Changes in your skin, such as the development of a rash or itchiness.  Vaginal discharge or bleeding.  Feelings of excitement or nervousness.  Changes in your baby's movements. You may want to write down any questions or topics you want to discuss with your health care provider and bring them with you to your appointment. Examination During your first prenatal care visit, you will likely have a complete physical exam. Your health care provider will often examine your vagina, cervix, and the position of your uterus, as well as check your heart, lungs, and other body systems. As your pregnancy progresses, your health care provider will measure the size of your uterus and your baby's position inside your uterus. He or she may also examine you for early signs of labor. Your prenatal visits may also include checking your blood pressure and, after about 10-12 weeks of pregnancy, listening to your baby's heartbeat. Testing Regular testing often includes:  Urinalysis. This checks your urine for glucose, protein, or signs of infection.  Blood count. This checks the levels of white and red blood cells in your body.  Tests for sexually transmitted infections (STIs). Testing for STIs at the beginning of pregnancy is routinely done and is required in many states.  Antibody testing. You will be checked to see if you are immune to certain illnesses, such as rubella, that can affect a developing fetus.  Glucose screen. Around 24-28 weeks of pregnancy, your blood glucose level will be checked for signs of gestational diabetes. Follow-up tests may be recommended.  Group B strep. This is a bacteria that is commonly found inside a woman's vagina. This test will inform your health care provider if you need  an antibiotic to reduce the amount of this bacteria in your body prior to labor and childbirth.  Ultrasound. Many pregnant women undergo an ultrasound screening around 18-20 weeks of pregnancy to evaluate the health of the fetus and check for any developmental abnormalities.  HIV (human immunodeficiency virus) testing. Early in your pregnancy, you will be screened for HIV. If you are at high risk for HIV, this test may be repeated during your third trimester of pregnancy. You may be offered other testing based on your age, personal or family medical history, or other factors.  HOW OFTEN SHOULD I PLAN TO SEE MY HEALTH CARE PROVIDER FOR PRENATAL CARE? Your prenatal care check-up schedule depends on any medical conditions you have before, or develop during, your pregnancy. If you do not have any underlying medical conditions, you will likely be seen for checkups:  Monthly, during the first 6 months of pregnancy.  Twice a month during months 7 and 8 of pregnancy.  Weekly starting in the 9th month of pregnancy and until delivery. If you develop signs of early labor or other concerning signs or symptoms, you may need to see your health care provider more often. Ask your health care provider what prenatal care schedule is best for you. WHAT CAN I DO TO KEEP MYSELF AND MY  BABY AS HEALTHY AS POSSIBLE DURING MY PREGNANCY?  Take a prenatal vitamin containing 400 micrograms (0.4 mg) of folic acid every day. Your health care provider may also ask you to take additional vitamins such as iodine, vitamin D, iron, copper, and zinc.  Take 1500-2000 mg of calcium daily starting at your 20th week of pregnancy until you deliver your baby.  Make sure you are up to date on your vaccinations. Unless directed otherwise by your health care provider:  You should receive a tetanus, diphtheria, and pertussis (Tdap) vaccination between the 27th and 36th week of your pregnancy, regardless of when your last Tdap immunization  occurred. This helps protect your baby from whooping cough (pertussis) after he or she is born.  You should receive an annual inactivated influenza vaccine (IIV) to help protect you and your baby from influenza. This can be done at any point during your pregnancy.  Eat a well-rounded diet that includes:  Fresh fruits and vegetables.  Lean proteins.  Calcium-rich foods such as milk, yogurt, hard cheeses, and dark, leafy greens.  Whole grain breads.  Do noteat seafood high in mercury, including:  Swordfish.  Tilefish.  Shark.  Pentecost mackerel.  More than 6 oz tuna per week.  Do not eat:  Raw or undercooked meats or eggs.  Unpasteurized foods, such as soft cheeses (brie, blue, or feta), juices, and milks.  Lunch meats.  Hot dogs that have not been heated until they are steaming.  Drink enough water to keep your urine clear or pale yellow. For many women, this may be 10 or more 8 oz glasses of water each day. Keeping yourself hydrated helps deliver nutrients to your baby and may prevent the start of pre-term uterine contractions.  Do not use any tobacco products including cigarettes, chewing tobacco, or electronic cigarettes. If you need help quitting, ask your health care provider.  Do not drink beverages containing alcohol. No safe level of alcohol consumption during pregnancy has been determined.  Do not use any illegal drugs. These can harm your developing baby or cause a miscarriage.  Ask your health care provider or pharmacist before taking any prescription or over-the-counter medicines, herbs, or supplements.  Limit your caffeine intake to no more than 200 mg per day.  Exercise. Unless told otherwise by your health care provider, try to get 30 minutes of moderate exercise most days of the week. Do not  do high-impact activities, contact sports, or activities with a high risk of falling, such as horseback riding or downhill skiing.  Get plenty of rest.  Avoid  anything that raises your body temperature, such as hot tubs and saunas.  If you own a cat, do not empty its litter box. Bacteria contained in cat feces can cause an infection called toxoplasmosis. This can result in serious harm to the fetus.  Stay away from chemicals such as insecticides, lead, mercury, and cleaning or paint products that contain solvents.  Do not have any X-rays taken unless medically necessary.  Take a childbirth and breastfeeding preparation class. Ask your health care provider if you need a referral or recommendation.   This information is not intended to replace advice given to you by your health care provider. Make sure you discuss any questions you have with your health care provider.   Document Released: 11/25/2003 Document Revised: 12/13/2014 Document Reviewed: 02/06/2014 Elsevier Interactive Patient Education 2016 Elsevier Inc[employer]  Upper Respiratory Infection, Adult Most upper respiratory infections (URIs) are a viral infection of the air  passages leading to the lungs. A URI affects the nose, throat, and upper air passages. The most common type of URI is nasopharyngitis and is typically referred to as "the common cold." URIs run their course and usually go away on their own. Most of the time, a URI does not require medical attention, but sometimes a bacterial infection in the upper airways can follow a viral infection. This is called a secondary infection. Sinus and middle ear infections are common types of secondary upper respiratory infections. Bacterial pneumonia can also complicate a URI. A URI can worsen asthma and chronic obstructive pulmonary disease (COPD). Sometimes, these complications can require emergency medical care and may be life threatening.  CAUSES Almost all URIs are caused by viruses. A virus is a type of germ and can spread from one person to another.  RISKS FACTORS You may be at risk for a URI if:   You smoke.   You have chronic heart or  lung disease.  You have a weakened defense (immune) system.   You are very young or very old.   You have nasal allergies or asthma.  You work in crowded or poorly ventilated areas.  You work in health care facilities or schools. SIGNS AND SYMPTOMS  Symptoms typically develop 2-3 days after you come in contact with a cold virus. Most viral URIs last 7-10 days. However, viral URIs from the influenza virus (flu virus) can last 14-18 days and are typically more severe. Symptoms may include:   Runny or stuffy (congested) nose.   Sneezing.   Cough.   Sore throat.   Headache.   Fatigue.   Fever.   Loss of appetite.   Pain in your forehead, behind your eyes, and over your cheekbones (sinus pain).  Muscle aches.  DIAGNOSIS  Your health care provider may diagnose a URI by:  Physical exam.  Tests to check that your symptoms are not due to another condition such as:  Strep throat.  Sinusitis.  Pneumonia.  Asthma. TREATMENT  A URI goes away on its own with time. It cannot be cured with medicines, but medicines may be prescribed or recommended to relieve symptoms. Medicines may help:  Reduce your fever.  Reduce your cough.  Relieve nasal congestion. HOME CARE INSTRUCTIONS   Take medicines only as directed by your health care provider.   Gargle warm saltwater or take cough drops to comfort your throat as directed by your health care provider.  Use a warm mist humidifier or inhale steam from a shower to increase air moisture. This may make it easier to breathe.  Drink enough fluid to keep your urine clear or pale yellow.   Eat soups and other clear broths and maintain good nutrition.   Rest as needed.   Return to work when your temperature has returned to normal or as your health care provider advises. You may need to stay home longer to avoid infecting others. You can also use a face mask and careful hand washing to prevent spread of the  virus.  Increase the usage of your inhaler if you have asthma.   Do not use any tobacco products, including cigarettes, chewing tobacco, or electronic cigarettes. If you need help quitting, ask your health care provider. PREVENTION  The best way to protect yourself from getting a cold is to practice good hygiene.   Avoid oral or hand contact with people with cold symptoms.   Wash your hands often if contact occurs.  There is no clear  evidence that vitamin C, vitamin E, echinacea, or exercise reduces the chance of developing a cold. However, it is always recommended to get plenty of rest, exercise, and practice good nutrition.  SEEK MEDICAL CARE IF:   You are getting worse rather than better.   Your symptoms are not controlled by medicine.   You have chills.  You have worsening shortness of breath.  You have brown or red mucus.  You have yellow or brown nasal discharge.  You have pain in your face, especially when you bend forward.  You have a fever.  You have swollen neck glands.  You have pain while swallowing.  You have white areas in the back of your throat. SEEK IMMEDIATE MEDICAL CARE IF:   You have severe or persistent:  Headache.  Ear pain.  Sinus pain.  Chest pain.  You have chronic lung disease and any of the following:  Wheezing.  Prolonged cough.  Coughing up blood.  A change in your usual mucus.  You have a stiff neck.  You have changes in your:  Vision.  Hearing.  Thinking.  Mood. MAKE SURE YOU:   Understand these instructions.  Will watch your condition.  Will get help right away if you are not doing well or get worse.   This information is not intended to replace advice given to you by your health care provider. Make sure you discuss any questions you have with your health care provider.   Document Released: 05/18/2001 Document Revised: 04/08/2015 Document Reviewed: 02/27/2014 Elsevier Interactive Patient Education  [employer]

## 2015-10-24 NOTE — ED Notes (Signed)
Patient with no complaints at this time. Respirations even and unlabored. Skin warm/dry. Discharge instructions reviewed with patient at this time. Patient given opportunity to voice concerns/ask questions. Patient discharged at this time and left Emergency Department with steady gait.   

## 2015-10-24 NOTE — ED Notes (Signed)
Patient with cough x 4-5 days and fever. Dry cough per patient. States she also needs pregnancy test for medicaid because she took a home test that was positive and they sent her here for confirmation.

## 2015-10-27 NOTE — ED Provider Notes (Signed)
CSN: [id number]     Arrival date & time 10/24/15  0805 History   First MD Initiated Contact with Patient 10/24/15 [phone]     Chief Complaint  Patient presents with  . Cough     (Consider location/radiation/quality/duration/timing/severity/associated sxs/prior Treatment) The history is provided by the patient and a significant other.   Olivia Fuller is a 29 y.o. female presenting primarily in need of documentation of pregnancy status so she can apply for prenatal services, but also would like to be evaluated for a 4-5 day history of a nonproductive cough.  She denies fevers, chills, sob, chest pain but does endorse nasal congestion with rhinorrhea.  She has taken no medicines for her symptoms. Her LMP was 09/19/15 and normal.  She took a home pregnancy test which was positive, needs documentation to apply for benefits.  She denies abdominal pain, nausea, vomiting, vaginal discharge or bleeding.    Past Medical History  Diagnosis Date  . Asthma   . Scoliosis   . DDD (degenerative disc disease)   . Anxiety    Past Surgical History  Procedure Laterality Date  . No surgical history    . Lumbar laminectomy/decompression microdiscectomy  07/06/2012    Procedure: LUMBAR LAMINECTOMY/DECOMPRESSION MICRODISCECTOMY 1 LEVEL;  Surgeon: Cristi LoronJeffrey D Jenkins, MD;  Location: [facility];  Service: Neurosurgery;  Laterality: Left;  LEFT Lumbar Five-Sacral One diskectomy  . Back surgery     No family history on file. Social History  Substance Use Topics  . Smoking status: Former Smoker -- 5 years    Quit date: 02/14/2012  . Smokeless tobacco: None  . Alcohol Use: No   OB History    No data available     Review of Systems  Constitutional: Negative for fever and chills.  HENT: Positive for congestion, rhinorrhea and sore throat. Negative for ear pain, sinus pressure, trouble swallowing and voice change.   Eyes: Negative for discharge.  Respiratory: Positive for cough. Negative for shortness of  breath, wheezing and stridor.   Cardiovascular: Negative for chest pain.  Gastrointestinal: Negative for abdominal pain.  Genitourinary: Negative.  Negative for vaginal discharge and vaginal pain.  Skin: Negative.       Allergies  Tylenol and Zofran  Home Medications   Prior to Admission medications   Medication Sig Start Date End Date Taking? Authorizing Provider  albuterol (PROVENTIL HFA;VENTOLIN HFA) 108 (90 BASE) MCG/ACT inhaler Inhale 2 puffs into the lungs every 6 (six) hours as needed for wheezing.    Historical Provider, MD  amphetamine-dextroamphetamine (ADDERALL) 20 MG tablet Take 20 mg by mouth 2 (two) times daily.     Historical Provider, MD  cholecalciferol (VITAMIN D) 1000 UNITS tablet Take 1,000 Units by mouth daily.    Historical Provider, MD  metroNIDAZOLE (FLAGYL) 500 MG tablet Take 1 tablet (500 mg total) by mouth 2 (two) times daily. 06/18/15   Burgess AmorJulie Hollee Fate, PA-C  Oxycodone HCl 10 MG TABS Take 10 mg by mouth every 8 (eight) hours.    Historical Provider, MD  Prenatal Vit-Fe Fumarate-FA (MULTIVITAMIN-PRENATAL) 27-0.8 MG TABS tablet Take 1 tablet by mouth daily at 12 noon. 10/24/15   Burgess AmorJulie Romar Woodrick, PA-C  Prenatal Vit-Fe Fumarate-FA (PRENATAL MULTIVITAMIN) TABS tablet Take 1 tablet by mouth daily at 12 noon.    Historical Provider, MD   BP 107/65 mmHg  Pulse 82  Temp(Src) 98.1 F (36.7 C) (Oral)  Resp 18  Ht 5\' 7"  (1.702 m)  Wt 81.647 kg  BMI 28.19 kg/m2  SpO2 100%  LMP 09/19/2015 Physical Exam  ED Course  Procedures (including critical care time) Labs Review Labs Reviewed  POC URINE PREG, ED - Abnormal; Notable for the following:    Preg Test, Ur POSITIVE (*)    All other components within normal limits    Imaging Review No results found. I have personally reviewed and evaluated these images and lab results as part of my medical decision-making.   EKG Interpretation None      MDM   Final diagnoses:  Pregnant  Acute URI    Positive pregnancy per  our test here, documentation given.  Prenatal vitamin prescribed, pt given referral for establishing prenatal care. Also given list of safe otc meds in pregnancy, esp safe uri meds.    Prn f/u anticipated.   The patient appears reasonably screened and/or stabilized for discharge and I doubt any other medical condition or other [facility] requiring further screening, evaluation, or treatment in the ED at this time prior to discharge.    Burgess Amor, PA-C 10/27/15 1333  Gerhard Munch, MD 10/30/15 [phone]

## 2015-11-11 ENCOUNTER — Ambulatory Visit (INDEPENDENT_AMBULATORY_CARE_PROVIDER_SITE_OTHER): Payer: BLUE CROSS/BLUE SHIELD | Admitting: Women's Health

## 2015-11-11 ENCOUNTER — Encounter: Payer: Self-pay | Admitting: Women's Health

## 2015-11-11 VITALS — BP 104/60 | HR 60 | Wt 190.0 lb

## 2015-11-11 DIAGNOSIS — Z3400 Encounter for supervision of normal first pregnancy, unspecified trimester: Secondary | ICD-10-CM | POA: Insufficient documentation

## 2015-11-11 DIAGNOSIS — Z331 Pregnant state, incidental: Secondary | ICD-10-CM

## 2015-11-11 DIAGNOSIS — Z1389 Encounter for screening for other disorder: Secondary | ICD-10-CM

## 2015-11-11 DIAGNOSIS — F419 Anxiety disorder, unspecified: Secondary | ICD-10-CM

## 2015-11-11 DIAGNOSIS — Z3401 Encounter for supervision of normal first pregnancy, first trimester: Secondary | ICD-10-CM

## 2015-11-11 DIAGNOSIS — Z0283 Encounter for blood-alcohol and blood-drug test: Secondary | ICD-10-CM

## 2015-11-11 DIAGNOSIS — Z369 Encounter for antenatal screening, unspecified: Secondary | ICD-10-CM

## 2015-11-11 LAB — POCT URINALYSIS DIPSTICK
Blood, UA: NEGATIVE
Glucose, UA: NEGATIVE
Ketones, UA: NEGATIVE
LEUKOCYTES UA: NEGATIVE
NITRITE UA: NEGATIVE
PROTEIN UA: NEGATIVE

## 2015-11-11 MED ORDER — DOXYLAMINE-PYRIDOXINE 10-10 MG PO TBEC: DELAYED_RELEASE_TABLET | ORAL | Status: DC

## 2015-11-11 NOTE — Patient Instructions (Signed)

## 2015-11-11 NOTE — Progress Notes (Signed)
Subjective:  Olivia Fuller is a 29 y.o. G1P0 Caucasian female at [redacted]w[redacted]d by IUI date of 10/28 which matches exactly w/ LMP date, being seen today for her first obstetrical visit.  Tried to conceive w/ 50yo boyfriend x 32yrs w/o success, paid for 1 round of IUD which was unsuccessful, then enrolled in trial for new female medication to help w/ fertility- and got pregnant after 1st round of IUI w/ that w/ [facility]. He has 1 other biological child that is 28yo. Had dating u/s last week w/ [facility]. Pt states they did not tell her an EDC. Doesn't have records w/ her as she said they told her our office would need to request them. Her obstetrical history is significant for primigravida.  Pregnancy history fully reviewed.  Patient reports nausea- no vomiting- requests meds. Some constipation. H/O anxiety, has been off xanax x ~17yr, doing ok, still gets anxious at times but is able to control it by getting off to self for a little while. Interested in counseling- lives in [place], I am unaware of any counselors out that way that take [employer]- to try googling/asking friends etc for references.  H/O asthma, uses inhaler occ.  Denies vb, cramping, uti s/s, abnormal/malodorous vag d/c, or vulvovaginal itching/irritation.  BP 104/60 mmHg  Pulse 60  Wt 190 lb (86.183 kg)  LMP 09/19/2015  HISTORY: OB History  Gravida Para Term Preterm AB SAB TAB Ectopic Multiple Living  1             # Outcome Date GA Lbr Len/2nd Weight Sex Delivery Anes PTL Lv  1 Current              Past Medical History  Diagnosis Date  . Asthma   . Scoliosis   . DDD (degenerative disc disease)   . Anxiety    Past Surgical History  Procedure Laterality Date  . No surgical history    . Lumbar laminectomy/decompression microdiscectomy  07/06/2012    Procedure: LUMBAR LAMINECTOMY/DECOMPRESSION MICRODISCECTOMY 1 LEVEL;  Surgeon: Cristi Loron, MD;  Location: [facility];  Service: Neurosurgery;  Laterality:  Left;  LEFT Lumbar Five-Sacral One diskectomy  . Back surgery     Family History  Problem Relation Age of Onset  . Seizures Mother   . Alcohol abuse Mother   . Drug abuse Mother   . Bipolar disorder Father   . Cancer Paternal Grandmother     colon  . Hyperlipidemia Paternal Grandmother   . Hypertension Paternal Grandmother   . Cancer Paternal Grandfather     prostate, bone  . Diabetes Paternal Grandfather   . Heart disease Paternal Grandfather     Exam   System:     General: Well developed & nourished, no acute distress   Skin: Warm & dry, normal coloration and turgor, no rashes   Neurologic: Alert & oriented, normal mood   Cardiovascular: Regular rate & rhythm   Respiratory: Effort & rate normal, LCTAB, acyanotic   Abdomen: Soft, non tender   Extremities: normal strength, tone  Thin prep pap smear neg 2015 @ [facility]    Assessment:   Pregnancy: G1P0 Patient Active Problem List   Diagnosis Date Noted  . Supervision of normal first pregnancy 11/11/2015    Priority: High  . ALLERGIC RHINITIS 11/24/2010  . ASTHMA 11/24/2010  . GERD 11/24/2010  . DYSPNEA 11/24/2010  . CHEST PAIN, ATYPICAL, HX OF 11/24/2010    [redacted]w[redacted]d by IUI date 10/28 G1P0  New OB visit Nausea Constipation H/O anxiety H/O asthma   Plan:  Initial labs drawn Continue prenatal vitamins Problem list reviewed and updated Reviewed n/v relief measures and warning s/s to report Rx diclegis, coupon card given Reviewed recommended weight gain based on pre-gravid BMI Encouraged well-balanced diet Genetic Screening discussed Integrated Screen: declined Cystic fibrosis screening discussed declined Ultrasound discussed; fetal survey: requested Follow up in 4 weeks for visit [facility] completed Gave printed info on constipation  Marge DuncansBooker, Ygnacio Fecteau Randall CNM, [facility]NP-BC 11/11/2015 10:52 AM

## 2015-11-12 LAB — GC/CHLAMYDIA PROBE AMP
Chlamydia trachomatis, NAA: NEGATIVE
NEISSERIA GONORRHOEAE BY PCR: NEGATIVE

## 2015-11-13 LAB — URINE CULTURE

## 2015-11-17 ENCOUNTER — Encounter: Payer: Self-pay | Admitting: *Deleted

## 2015-12-09 ENCOUNTER — Ambulatory Visit (INDEPENDENT_AMBULATORY_CARE_PROVIDER_SITE_OTHER): Payer: 59 | Admitting: Women's Health

## 2015-12-09 ENCOUNTER — Encounter: Payer: Self-pay | Admitting: Women's Health

## 2015-12-09 VITALS — BP 86/64 | HR 60 | Wt 191.0 lb

## 2015-12-09 DIAGNOSIS — Z1389 Encounter for screening for other disorder: Secondary | ICD-10-CM

## 2015-12-09 DIAGNOSIS — Z331 Pregnant state, incidental: Secondary | ICD-10-CM

## 2015-12-09 DIAGNOSIS — Z3401 Encounter for supervision of normal first pregnancy, first trimester: Secondary | ICD-10-CM

## 2015-12-09 LAB — POCT URINALYSIS DIPSTICK
Blood, UA: NEGATIVE
Glucose, UA: NEGATIVE
KETONES UA: NEGATIVE
LEUKOCYTES UA: NEGATIVE
Nitrite, UA: NEGATIVE

## 2015-12-09 NOTE — Patient Instructions (Signed)

## 2015-12-09 NOTE — Progress Notes (Signed)
Low-risk OB appointment G1P0 503w4d Estimated Date of Delivery: 06/25/16 BP 86/64 mmHg  Pulse 60  Wt 191 lb (86.637 kg)  LMP 09/19/2015  BP, weight, and urine reviewed.  Refer to obstetrical flow sheet for FH & FHR.  No fm yet. Denies cramping, lof, vb, or uti s/s. Nausea, threw up for first time this am, feels like diclegis is making it worse, declines other meds- feels it isn't that bad. Couldn't get pn1 labs at new ob visit d/t balance w/ [facility], states she still hasn't paid this but was told she could do a payment plan. Called [employer], states pt has to call [facility] directly herself to set up payment plan- can make a payment today and have pn1 done today.  Reviewed warning s/s to report. Plan:  Continue routine obstetrical care  F/U in 4wks for OB appointment

## 2015-12-10 ENCOUNTER — Telehealth: Payer: Self-pay | Admitting: *Deleted

## 2015-12-10 MED ORDER — PROMETHAZINE HCL 25 MG PO TABS: 12.5000 mg | ORAL_TABLET | Freq: Four times a day (QID) | ORAL | Status: DC | PRN

## 2015-12-10 NOTE — Telephone Encounter (Signed)
Requesting meds for nause/vomiting, Diclegis not helping.  Note for work given to excuse for 01/03-01/03/2016.

## 2015-12-12 LAB — URINALYSIS, ROUTINE W REFLEX MICROSCOPIC
BILIRUBIN UA: NEGATIVE
Glucose, UA: NEGATIVE
Ketones, UA: NEGATIVE
LEUKOCYTES UA: NEGATIVE
Nitrite, UA: NEGATIVE
PH UA: 6 (ref 5.0–7.5)
RBC, UA: NEGATIVE
SPEC GRAV UA: 1.027 (ref 1.005–1.030)
UUROB: 0.2 mg/dL (ref 0.2–1.0)

## 2015-12-12 LAB — CBC
Hematocrit: 39 % (ref 34.0–46.6)
Hemoglobin: 13.4 g/dL (ref 11.1–15.9)
MCH: 32.7 pg (ref 26.6–33.0)
MCHC: 34.4 g/dL (ref 31.5–35.7)
MCV: 95 fL (ref 79–97)
Platelets: 299 10*3/uL (ref 150–379)
RBC: 4.1 x10E6/uL (ref 3.77–5.28)
RDW: 13 % (ref 12.3–15.4)
WBC: 8.1 10*3/uL (ref 3.4–10.8)

## 2015-12-12 LAB — PMP SCREEN PROFILE (10S), URINE

## 2015-12-12 LAB — VARICELLA ZOSTER ANTIBODY, IGG: VARICELLA: 1343 {index} (ref >165)

## 2015-12-12 LAB — ABO/RH: Rh Factor: POSITIVE

## 2015-12-12 LAB — RPR: RPR Ser Ql: NONREACTIVE

## 2015-12-12 LAB — RUBELLA SCREEN: Rubella Antibodies, IGG: 15.7 index (ref >0.99)

## 2015-12-12 LAB — HIV ANTIBODY (ROUTINE TESTING W REFLEX): HIV Screen 4th Generation wRfx: NONREACTIVE

## 2015-12-12 LAB — ANTIBODY SCREEN: Antibody Screen: NEGATIVE

## 2015-12-12 LAB — HEPATITIS B SURFACE ANTIGEN: HEP B S AG: NEGATIVE

## 2015-12-15 ENCOUNTER — Telehealth: Payer: Self-pay | Admitting: *Deleted
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
## 2015-12-16 ENCOUNTER — Ambulatory Visit (INDEPENDENT_AMBULATORY_CARE_PROVIDER_SITE_OTHER): Payer: 59 | Admitting: Obstetrics & Gynecology

## 2015-12-16 ENCOUNTER — Encounter: Payer: Self-pay | Admitting: Obstetrics & Gynecology

## 2015-12-16 DIAGNOSIS — Z0283 Encounter for blood-alcohol and blood-drug test: Secondary | ICD-10-CM

## 2015-12-16 DIAGNOSIS — Z1389 Encounter for screening for other disorder: Secondary | ICD-10-CM

## 2015-12-16 DIAGNOSIS — Z331 Pregnant state, incidental: Secondary | ICD-10-CM

## 2015-12-16 LAB — POCT URINALYSIS DIPSTICK
Blood, UA: NEGATIVE
GLUCOSE UA: NEGATIVE
KETONES UA: NEGATIVE
Leukocytes, UA: NEGATIVE
NITRITE UA: NEGATIVE
Protein, UA: NEGATIVE
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
## 2015-12-17 LAB — PMP SCREEN PROFILE (10S), URINE
AMPHETAMINE SCRN UR: NEGATIVE ng/mL
Barbiturate Screen, Ur: NEGATIVE ng/mL
Benzodiazepine Screen, Urine: NEGATIVE ng/mL
CANNABINOIDS UR QL SCN: POSITIVE ng/mL
COCAINE(METAB.) SCREEN, URINE: NEGATIVE ng/mL
Creatinine(Crt), U: 177.4 mg/dL (ref 20.0–300.0)
Methadone Scn, Ur: NEGATIVE ng/mL
OXYCODONE+OXYMORPHONE UR QL SCN: NEGATIVE ng/mL
Opiate Scrn, Ur: NEGATIVE ng/mL
PCP SCRN UR: NEGATIVE ng/mL
PH UR, DRUG SCRN: 5.3 (ref 4.5–8.9)
PROPOXYPHENE SCREEN: NEGATIVE ng/mL

## 2016-01-06 ENCOUNTER — Ambulatory Visit (INDEPENDENT_AMBULATORY_CARE_PROVIDER_SITE_OTHER): Payer: 59 | Admitting: Women's Health

## 2016-01-06 VITALS — BP 98/50 | HR 94 | Wt 191.0 lb

## 2016-01-06 DIAGNOSIS — F1290 Cannabis use, unspecified, uncomplicated: Secondary | ICD-10-CM | POA: Insufficient documentation

## 2016-01-06 DIAGNOSIS — F1210 Cannabis abuse, uncomplicated: Secondary | ICD-10-CM

## 2016-01-06 DIAGNOSIS — Z1389 Encounter for screening for other disorder: Secondary | ICD-10-CM

## 2016-01-06 DIAGNOSIS — Z3402 Encounter for supervision of normal first pregnancy, second trimester: Secondary | ICD-10-CM

## 2016-01-06 DIAGNOSIS — Z331 Pregnant state, incidental: Secondary | ICD-10-CM

## 2016-01-06 DIAGNOSIS — Z363 Encounter for antenatal screening for malformations: Secondary | ICD-10-CM

## 2016-01-06 LAB — POCT URINALYSIS DIPSTICK
GLUCOSE UA: NEGATIVE
KETONES UA: NEGATIVE
Leukocytes, UA: NEGATIVE
Nitrite, UA: NEGATIVE
RBC UA: NEGATIVE

## 2016-01-06 NOTE — Patient Instructions (Signed)
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
## 2016-01-20 ENCOUNTER — Other Ambulatory Visit: Payer: Self-pay | Admitting: *Deleted

## 2016-01-20 MED ORDER — PROMETHAZINE HCL 25 MG PO TABS: 12.5000 mg | ORAL_TABLET | Freq: Four times a day (QID) | ORAL | Status: DC | PRN

## 2016-01-28 ENCOUNTER — Inpatient Hospital Stay (HOSPITAL_COMMUNITY)
Admission: AD | Admit: 2016-01-28 | Discharge: 2016-01-28 | Disposition: A | Discharge status: Home / Self Care | Payer: Medicaid Other | Source: Ambulatory Visit | Attending: Obstetrics and Gynecology | Admitting: Obstetrics and Gynecology

## 2016-01-28 ENCOUNTER — Encounter (HOSPITAL_COMMUNITY): Payer: Self-pay | Admitting: *Deleted

## 2016-01-28 DIAGNOSIS — O99612 Diseases of the digestive system complicating pregnancy, second trimester: Secondary | ICD-10-CM | POA: Diagnosis not present

## 2016-01-28 DIAGNOSIS — R109 Unspecified abdominal pain: Secondary | ICD-10-CM | POA: Insufficient documentation

## 2016-01-28 DIAGNOSIS — Z3402 Encounter for supervision of normal first pregnancy, second trimester: Secondary | ICD-10-CM | POA: Insufficient documentation

## 2016-01-28 DIAGNOSIS — M419 Scoliosis, unspecified: Secondary | ICD-10-CM | POA: Insufficient documentation

## 2016-01-28 DIAGNOSIS — O219 Vomiting of pregnancy, unspecified: Secondary | ICD-10-CM | POA: Diagnosis present

## 2016-01-28 DIAGNOSIS — Z9889 Other specified postprocedural states: Secondary | ICD-10-CM | POA: Insufficient documentation

## 2016-01-28 DIAGNOSIS — K529 Noninfective gastroenteritis and colitis, unspecified: Secondary | ICD-10-CM | POA: Insufficient documentation

## 2016-01-28 DIAGNOSIS — Z886 Allergy status to analgesic agent status: Secondary | ICD-10-CM | POA: Insufficient documentation

## 2016-01-28 DIAGNOSIS — Z87891 Personal history of nicotine dependence: Secondary | ICD-10-CM | POA: Diagnosis not present

## 2016-01-28 DIAGNOSIS — Z3A18 18 weeks gestation of pregnancy: Secondary | ICD-10-CM | POA: Insufficient documentation

## 2016-01-28 DIAGNOSIS — Z888 Allergy status to other drugs, medicaments and biological substances status: Secondary | ICD-10-CM | POA: Diagnosis not present

## 2016-01-28 DIAGNOSIS — J45909 Unspecified asthma, uncomplicated: Secondary | ICD-10-CM | POA: Diagnosis not present

## 2016-01-28 HISTORY — DX: Urinary tract infection, site not specified: N39.0

## 2016-01-28 LAB — URINALYSIS, ROUTINE W REFLEX MICROSCOPIC
BILIRUBIN URINE: NEGATIVE
Glucose, UA: NEGATIVE mg/dL
HGB URINE DIPSTICK: NEGATIVE
Ketones, ur: 15 mg/dL — AB
Leukocytes, UA: NEGATIVE
NITRITE: NEGATIVE
PH: 5.5 (ref 5.0–8.0)
Protein, ur: NEGATIVE mg/dL
SPECIFIC GRAVITY, URINE: 1.025 (ref 1.005–1.030)

## 2016-01-28 MED ORDER — SODIUM CHLORIDE 0.9 % IV SOLN
25.0000 mg | Freq: Once | INTRAVENOUS | Status: AC
  Administered 2016-01-28: 25 mg via INTRAVENOUS
  Filled 2016-01-28: qty 1

## 2016-01-28 NOTE — Discharge Instructions (Signed)

## 2016-01-28 NOTE — MAU Note (Signed)
Pt presents to MAU with complaints of lower abdominal pain with vomiting since last night. PT denies any vaginal bleeding or abnormal discharge

## 2016-01-28 NOTE — MAU Provider Note (Signed)
Chief Complaint:  Emesis and Abdominal Pain   First Provider Initiated Contact with Patient 01/28/16 1307     HPI  Olivia Fuller is a 30 y.o. G1P0 at 2w5dwho presents to maternity admissions reporting vomiting and lower abdominal cramping since yesterday.  Tried Phenergan but throws it up.  No bleeding.  No diarrhea. Works in nursing home. No one else at home is sick.  She reports good fetal movement, denies LOF, vaginal bleeding, vaginal itching/burning, urinary symptoms, h/a, dizziness,  diarrhea, constipation or fever/chills.  She denies headache, visual changes.  RN note: Pt presents to MAU with complaints of lower abdominal pain with vomiting since last night. PT denies any vaginal bleeding or abnormal discharge           Past Medical History: Past Medical History  Diagnosis Date  . Asthma   . Scoliosis   . DDD (degenerative disc disease)   . Anxiety   . UTI (lower urinary tract infection)     Past obstetric history: OB History  Gravida Para Term Preterm AB SAB TAB Ectopic Multiple Living  1             # Outcome Date GA Lbr Len/2nd Weight Sex Delivery Anes PTL Lv  1 Current               Past Surgical History: Past Surgical History  Procedure Laterality Date  . No surgical history    . Lumbar laminectomy/decompression microdiscectomy  07/06/2012    Procedure: LUMBAR LAMINECTOMY/DECOMPRESSION MICRODISCECTOMY 1 LEVEL;  Surgeon: Cristi Loron, MD;  Location: [facility];  Service: Neurosurgery;  Laterality: Left;  LEFT Lumbar Five-Sacral One diskectomy  . Back surgery      Family History: Family History  Problem Relation Age of Onset  . Seizures Mother   . Alcohol abuse Mother   . Drug abuse Mother   . Bipolar disorder Father   . Cancer Paternal Grandmother     colon  . Hyperlipidemia Paternal Grandmother   . Hypertension Paternal Grandmother   . Cancer Paternal Grandfather     prostate, bone  . Diabetes Paternal Grandfather   . Heart disease  Paternal Grandfather     Social History: Social History  Substance Use Topics  . Smoking status: Former Smoker -- 5 years    Quit date: 02/14/2012  . Smokeless tobacco: None  . Alcohol Use: No    Allergies:  Allergies  Allergen Reactions  . Tylenol [Acetaminophen] Itching, Nausea And Vomiting and Other (See Comments)    None listed  . Zofran [Ondansetron Hcl] Hives and Itching    Meds:  Prescriptions prior to admission  Medication Sig Dispense Refill Last Dose  . Oxycodone HCl 10 MG TABS Take 10 mg by mouth every 8 (eight) hours. Reported on 01/06/2016   Not Taking  . Prenatal Vit-Fe Fumarate-FA (PRENATAL MULTIVITAMIN) TABS tablet Take 1 tablet by mouth daily at 12 noon. Reported on 12/09/2015   Taking  . promethazine (PHENERGAN) 25 MG tablet Take 0.5-1 tablets (12.5-25 mg total) by mouth every 6 (six) hours as needed for nausea or vomiting. 30 tablet 1     I have reviewed patient's Past Medical Hx, Surgical Hx, Family Hx, Social Hx, medications and allergies.   ROS:  Review of Systems  Constitutional: Positive for fatigue. Negative for fever and chills.  HENT: Negative for congestion and sore throat.   Respiratory: Negative for cough.   Gastrointestinal: Positive for nausea, vomiting and abdominal pain. Negative for diarrhea  and constipation.  Genitourinary: Negative for dysuria and vaginal bleeding.  Musculoskeletal: Negative for back pain.  Neurological: Positive for weakness.   Other systems negative  Physical Exam  Patient Vitals for the past 24 hrs:  BP Temp Temp src Pulse Resp  01/28/16 1056 110/76 mmHg 97.5 F (36.4 C) Oral (!) 132 20   Constitutional: Well-developed female in no acute distress.  Cardiovascular: normal rate and rhythm Respiratory: normal effort, clear to auscultation bilaterally GI: Abd soft, non-tender, gravid appropriate for gestational age.   No rebound or guarding. MS: Extremities nontender, no edema, normal ROM Neurologic: Alert and  oriented x 4.  GU: Neg CVAT.  PELVIC EXAM: Cervix firm, posterior, neg CMT, uterus nontender, Fundal Height consistent with dates, adnexa without tenderness, enlargement, or mass    Dilation: Closed Effacement (%): Thick Cervical Position: Posterior Station: Ballotable Exam by:: Joevon Holliman CNM  FHR: 150   Labs: Results for orders placed or performed during the hospital encounter of 01/28/16 (from the past 24 hour(s))  Urinalysis, Routine w reflex microscopic (not at [facility])     Status: Abnormal   Collection Time: 01/28/16 11:00 AM  Result Value Ref Range   Color, Urine YELLOW YELLOW   APPearance CLEAR CLEAR   Specific Gravity, Urine 1.025 1.005 - 1.030   pH 5.5 5.0 - 8.0   Glucose, UA NEGATIVE NEGATIVE mg/dL   Hgb urine dipstick NEGATIVE NEGATIVE   Bilirubin Urine NEGATIVE NEGATIVE   Ketones, ur 15 (A) NEGATIVE mg/dL   Protein, ur NEGATIVE NEGATIVE mg/dL   Nitrite NEGATIVE NEGATIVE   Leukocytes, UA NEGATIVE NEGATIVE   A/Positive/-- (01/03 7829)  Imaging:  No results found.  MAU Course/MDM: I have ordered labs and reviewed results.   Treatments in MAU included IV hydration.  >>  Felt much better after hydration.  Assessment: 1. Encounter for supervision of normal first pregnancy in second trimester   2.     Gastroenteritis   Plan: Discharge home PO intake as tolerated, concentrate more on liquids. Labor precautions and fetal kick counts Follow up in Office for prenatal visits and recheck    Medication List    ASK your doctor about these medications        Oxycodone HCl 10 MG Tabs  Take 10 mg by mouth every 8 (eight) hours. Reported on 01/06/2016     prenatal multivitamin Tabs tablet  Take 1 tablet by mouth daily at 12 noon. Reported on 12/09/2015     promethazine 25 MG tablet  Commonly known as:  PHENERGAN  Take 0.5-1 tablets (12.5-25 mg total) by mouth every 6 (six) hours as needed for nausea or vomiting.       Pt stable at time of discharge.  Wynelle Bourgeois CNM, MSN Certified Nurse-Midwife 01/28/2016 1:07 PM

## 2016-02-03 ENCOUNTER — Ambulatory Visit (INDEPENDENT_AMBULATORY_CARE_PROVIDER_SITE_OTHER): Payer: 59 | Admitting: Women's Health

## 2016-02-03 ENCOUNTER — Ambulatory Visit (INDEPENDENT_AMBULATORY_CARE_PROVIDER_SITE_OTHER): Payer: 59

## 2016-02-03 ENCOUNTER — Encounter: Payer: Self-pay | Admitting: Women's Health

## 2016-02-03 VITALS — BP 102/60 | HR 68 | Wt 192.0 lb

## 2016-02-03 DIAGNOSIS — Z36 Encounter for antenatal screening of mother: Secondary | ICD-10-CM | POA: Diagnosis not present

## 2016-02-03 DIAGNOSIS — Z331 Pregnant state, incidental: Secondary | ICD-10-CM

## 2016-02-03 DIAGNOSIS — Z1389 Encounter for screening for other disorder: Secondary | ICD-10-CM

## 2016-02-03 DIAGNOSIS — Z3402 Encounter for supervision of normal first pregnancy, second trimester: Secondary | ICD-10-CM

## 2016-02-03 DIAGNOSIS — Z363 Encounter for antenatal screening for malformations: Secondary | ICD-10-CM

## 2016-02-03 LAB — POCT URINALYSIS DIPSTICK
Glucose, UA: NEGATIVE
Ketones, UA: NEGATIVE
Leukocytes, UA: NEGATIVE
NITRITE UA: NEGATIVE
PROTEIN UA: NEGATIVE
RBC UA: NEGATIVE

## 2016-02-03 NOTE — Patient Instructions (Signed)

## 2016-02-03 NOTE — Progress Notes (Signed)
Low-risk OB appointment G1P0 [redacted]w[redacted]d Estimated Date of Delivery: 06/25/16 BP 102/60 mmHg  Pulse 68  Wt 192 lb (87.091 kg)  LMP 09/19/2015  BP, weight, and urine reviewed.  Refer to obstetrical flow sheet for FH & FHR.  Reports good fm.  Denies regular uc's, lof, vb, or uti s/s. No complaints. Reviewed today's normal anatomy u/s, warning s/s to report, fm. Plan:  Continue routine obstetrical care  F/U in 4wks for OB appointment

## 2016-02-03 NOTE — Progress Notes (Signed)
[place] 19+4wks,cephalic,rt lateral pl gr 0,normal ov's bilat,fhr 138 bpm,cx 3.7cm,svp of fluid 4.8cm,efw 313 g,anatomy complete no obvious abnormalities seen

## 2016-03-02 ENCOUNTER — Ambulatory Visit (INDEPENDENT_AMBULATORY_CARE_PROVIDER_SITE_OTHER): Payer: 59 | Admitting: Women's Health

## 2016-03-02 ENCOUNTER — Encounter: Payer: Self-pay | Admitting: Women's Health

## 2016-03-02 VITALS — BP 106/56 | HR 76 | Wt 198.0 lb

## 2016-03-02 DIAGNOSIS — F1210 Cannabis abuse, uncomplicated: Secondary | ICD-10-CM

## 2016-03-02 DIAGNOSIS — Z1389 Encounter for screening for other disorder: Secondary | ICD-10-CM

## 2016-03-02 DIAGNOSIS — Z3402 Encounter for supervision of normal first pregnancy, second trimester: Secondary | ICD-10-CM

## 2016-03-02 DIAGNOSIS — Z331 Pregnant state, incidental: Secondary | ICD-10-CM

## 2016-03-02 DIAGNOSIS — F1290 Cannabis use, unspecified, uncomplicated: Secondary | ICD-10-CM

## 2016-03-02 LAB — POCT URINALYSIS DIPSTICK
Blood, UA: NEGATIVE
GLUCOSE UA: NEGATIVE
Ketones, UA: NEGATIVE
Leukocytes, UA: NEGATIVE
NITRITE UA: NEGATIVE

## 2016-03-02 NOTE — Patient Instructions (Signed)
You will have your sugar test next visit.  Please do not eat or drink anything after midnight the night before you come, not even water.  You will be here for at least two hours.     Call the office ([phone]) or go to [facility] if:  You begin to have strong, frequent contractions  Your water breaks.  Sometimes it is a big gush of fluid, sometimes it is just a trickle that keeps getting your panties wet or running down your legs  You have vaginal bleeding.  It is normal to have a small amount of spotting if your cervix was checked.   You don't feel your baby moving like normal.  If you don't, get you something to eat and drink and lay down and focus on feeling your baby move.   If your baby is still not moving like normal, you should call the office or go to [facility].  Second Trimester of Pregnancy The second trimester is from week 13 through week 28, months 4 through 6. The second trimester is often a time when you feel your best. Your body has also adjusted to being pregnant, and you begin to feel better physically. Usually, morning sickness has lessened or quit completely, you may have more energy, and you may have an increase in appetite. The second trimester is also a time when the fetus is growing rapidly. At the end of the sixth month, the fetus is about 9 inches long and weighs about 1 pounds. You will likely begin to feel the baby move (quickening) between 18 and 20 weeks of the pregnancy. BODY CHANGES Your body goes through many changes during pregnancy. The changes vary from woman to woman.   Your weight will continue to increase. You will notice your lower abdomen bulging out.  You may begin to get stretch marks on your hips, abdomen, and breasts.  You may develop headaches that can be relieved by medicines approved by your health care provider.  You may urinate more often because the fetus is pressing on your bladder.  You may develop or continue to have  heartburn as a result of your pregnancy.  You may develop constipation because certain hormones are causing the muscles that push waste through your intestines to slow down.  You may develop hemorrhoids or swollen, bulging veins (varicose veins).  You may have back pain because of the weight gain and pregnancy hormones relaxing your joints between the bones in your pelvis and as a result of a shift in weight and the muscles that support your balance.  Your breasts will continue to grow and be tender.  Your gums may bleed and may be sensitive to brushing and flossing.  Dark spots or blotches (chloasma, mask of pregnancy) may develop on your face. This will likely fade after the baby is born.  A dark line from your belly button to the pubic area (linea nigra) may appear. This will likely fade after the baby is born.  You may have changes in your hair. These can include thickening of your hair, rapid growth, and changes in texture. Some women also have hair loss during or after pregnancy, or hair that feels dry or thin. Your hair will most likely return to normal after your baby is born. WHAT TO EXPECT AT YOUR PRENATAL VISITS During a routine prenatal visit:  You will be weighed to make sure you and the fetus are growing normally.  Your blood pressure will be taken.    Your abdomen will be measured to track your baby's growth.  The fetal heartbeat will be listened to.  Any test results from the previous visit will be discussed. Your health care provider may ask you:  How you are feeling.  If you are feeling the baby move.  If you have had any abnormal symptoms, such as leaking fluid, bleeding, severe headaches, or abdominal cramping.  If you have any questions. Other tests that may be performed during your second trimester include:  Blood tests that check for:  Low iron levels (anemia).  Gestational diabetes (between 24 and 28 weeks).  Rh antibodies.  Urine tests to check  for infections, diabetes, or protein in the urine.  An ultrasound to confirm the proper growth and development of the baby.  An amniocentesis to check for possible genetic problems.  Fetal screens for spina bifida and Down syndrome. HOME CARE INSTRUCTIONS   Avoid all smoking, herbs, alcohol, and unprescribed drugs. These chemicals affect the formation and growth of the baby.  Follow your health care provider's instructions regarding medicine use. There are medicines that are either safe or unsafe to take during pregnancy.  Exercise only as directed by your health care provider. Experiencing uterine cramps is a good sign to stop exercising.  Continue to eat regular, healthy meals.  Wear a good support bra for breast tenderness.  Do not use hot tubs, steam rooms, or saunas.  Wear your seat belt at all times when driving.  Avoid raw meat, uncooked cheese, cat litter boxes, and soil used by cats. These carry germs that can cause birth defects in the baby.  Take your prenatal vitamins.  Try taking a stool softener (if your health care provider approves) if you develop constipation. Eat more high-fiber foods, such as fresh vegetables or fruit and whole grains. Drink plenty of fluids to keep your urine clear or pale yellow.  Take warm sitz baths to soothe any pain or discomfort caused by hemorrhoids. Use hemorrhoid cream if your health care provider approves.  If you develop varicose veins, wear support hose. Elevate your feet for 15 minutes, 3-4 times a day. Limit salt in your diet.  Avoid heavy lifting, wear low heel shoes, and practice good posture.  Rest with your legs elevated if you have leg cramps or low back pain.  Visit your dentist if you have not gone yet during your pregnancy. Use a soft toothbrush to brush your teeth and be gentle when you floss.  A sexual relationship may be continued unless your health care provider directs you otherwise.  Continue to go to all your  prenatal visits as directed by your health care provider. SEEK MEDICAL CARE IF:   You have dizziness.  You have mild pelvic cramps, pelvic pressure, or nagging pain in the abdominal area.  You have persistent nausea, vomiting, or diarrhea.  You have a bad smelling vaginal discharge.  You have pain with urination. SEEK IMMEDIATE MEDICAL CARE IF:   You have a fever.  You are leaking fluid from your vagina.  You have spotting or bleeding from your vagina.  You have severe abdominal cramping or pain.  You have rapid weight gain or loss.  You have shortness of breath with chest pain.  You notice sudden or extreme swelling of your face, hands, ankles, feet, or legs.  You have not felt your baby move in over an hour.  You have severe headaches that do not go away with medicine.  You have vision changes.   Document Released: 11/16/2001 Document Revised: 11/27/2013 Document Reviewed: 01/23/2013 [employer] Patient Information 2015 [employer]. This information is not intended to replace advice given to you by your health care provider. Make sure you discuss any questions you have with your health care provider.     

## 2016-03-02 NOTE — Progress Notes (Signed)
Low-risk OB appointment G1P0 3855w4d Estimated Date of Delivery: 06/25/16 BP 106/56 mmHg  Pulse 76  Wt 198 lb (89.812 kg)  LMP 09/19/2015  BP, weight, and urine reviewed.  Refer to obstetrical flow sheet for FH & FHR.  Reports good fm.  Denies regular uc's, lof, vb, or uti s/s. Some occ sob, no other sx. 6lb wt gain in 4wks- eating a lot of cereal- to decrease carbs.  Reviewed ptl s/s, fm. Plan:  Continue routine obstetrical care  F/U in 4wks for OB appointment and pn2

## 2016-03-03 LAB — PMP SCREEN PROFILE (10S), URINE
Amphetamine Screen, Ur: NEGATIVE ng/mL
BARBITURATE SCRN UR: NEGATIVE ng/mL
BENZODIAZEPINE SCREEN, URINE: NEGATIVE ng/mL
CREATININE(CRT), U: 163.4 mg/dL (ref 20.0–300.0)
Cannabinoids Ur Ql Scn: NEGATIVE ng/mL
Cocaine(Metab.)Screen, Urine: NEGATIVE ng/mL
METHADONE SCREEN, URINE: NEGATIVE ng/mL
OXYCODONE+OXYMORPHONE UR QL SCN: NEGATIVE ng/mL
Opiate Scrn, Ur: NEGATIVE ng/mL
PCP Scrn, Ur: NEGATIVE ng/mL
PH UR, DRUG SCRN: 6.3 (ref 4.5–8.9)
PROPOXYPHENE SCREEN: NEGATIVE ng/mL

## 2016-03-10 ENCOUNTER — Encounter: Payer: Self-pay | Admitting: Women's Health

## 2016-03-10 ENCOUNTER — Ambulatory Visit (INDEPENDENT_AMBULATORY_CARE_PROVIDER_SITE_OTHER): Payer: 59 | Admitting: Women's Health

## 2016-03-10 VITALS — BP 104/60 | HR 88 | Wt 199.0 lb

## 2016-03-10 DIAGNOSIS — Z331 Pregnant state, incidental: Secondary | ICD-10-CM

## 2016-03-10 DIAGNOSIS — Z3402 Encounter for supervision of normal first pregnancy, second trimester: Secondary | ICD-10-CM

## 2016-03-10 DIAGNOSIS — R1032 Left lower quadrant pain: Secondary | ICD-10-CM

## 2016-03-10 DIAGNOSIS — Z1389 Encounter for screening for other disorder: Secondary | ICD-10-CM

## 2016-03-10 LAB — POCT URINALYSIS DIPSTICK
Blood, UA: NEGATIVE
GLUCOSE UA: NEGATIVE
Ketones, UA: NEGATIVE
Leukocytes, UA: NEGATIVE
NITRITE UA: NEGATIVE
Protein, UA: NEGATIVE

## 2016-03-10 NOTE — Progress Notes (Signed)
Work-in Low-risk OB appointment G1P0 1068w5d Estimated Date of Delivery: 06/25/16 BP 104/60 mmHg  Pulse 88  Wt 199 lb (90.266 kg)  LMP 09/19/2015  BP, weight, and urine reviewed.  Refer to obstetrical flow sheet for FH & FHR.  Reports good fm.  Denies regular uc's, lof, vb, or uti s/s. 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 Requests note to be out of work through the weekend- note given. BM's once q 2wks- info given on constipation.  Reviewed ptl s/s, fm. Plan:  Continue routine obstetrical care  F/U as scheduled for OB appointment and pn2

## 2016-03-10 NOTE — Patient Instructions (Signed)
Constipation  Drink plenty of fluid, preferably water, throughout the day  Eat foods high in fiber such as fruits, vegetables, and grains  Exercise, such as walking, is a good way to keep your bowels regular  Drink warm fluids, especially warm prune juice, or decaf coffee  Eat a 1/2 cup of real oatmeal (not instant), 1/2 cup applesauce, and 1/2-1 cup warm prune juice every day  If needed, you may take Colace (docusate sodium) stool softener once or twice a day to help keep the stool soft. If you are pregnant, wait until you are out of your first trimester (12-14 weeks of pregnancy)  If you still are having problems with constipation, you may take Miralax once daily as needed to help keep your bowels regular.  If you are pregnant, wait until you are out of your first trimester (12-14 weeks of pregnancy)   Second Trimester of Pregnancy The second trimester is from week 13 through week 28, months 4 through 6. The second trimester is often a time when you feel your best. Your body has also adjusted to being pregnant, and you begin to feel better physically. Usually, morning sickness has lessened or quit completely, you may have more energy, and you may have an increase in appetite. The second trimester is also a time when the fetus is growing rapidly. At the end of the sixth month, the fetus is about 9 inches long and weighs about 1 pounds. You will likely begin to feel the baby move (quickening) between 18 and 20 weeks of the pregnancy. BODY CHANGES Your body goes through many changes during pregnancy. The changes vary from woman to woman.   Your weight will continue to increase. You will notice your lower abdomen bulging out.  You may begin to get stretch marks on your hips, abdomen, and breasts.  You may develop headaches that can be relieved by medicines approved by your health care provider.  You may urinate more often because the fetus is pressing on your bladder.  You may develop or  continue to have heartburn as a result of your pregnancy.  You may develop constipation because certain hormones are causing the muscles that push waste through your intestines to slow down.  You may develop hemorrhoids or swollen, bulging veins (varicose veins).  You may have back pain because of the weight gain and pregnancy hormones relaxing your joints between the bones in your pelvis and as a result of a shift in weight and the muscles that support your balance.  Your breasts will continue to grow and be tender.  Your gums may bleed and may be sensitive to brushing and flossing.  Dark spots or blotches (chloasma, mask of pregnancy) may develop on your face. This will likely fade after the baby is born.  A dark line from your belly button to the pubic area (linea nigra) may appear. This will likely fade after the baby is born.  You may have changes in your hair. These can include thickening of your hair, rapid growth, and changes in texture. Some women also have hair loss during or after pregnancy, or hair that feels dry or thin. Your hair will most likely return to normal after your baby is born. WHAT TO EXPECT AT YOUR PRENATAL VISITS During a routine prenatal visit:  You will be weighed to make sure you and the fetus are growing normally.  Your blood pressure will be taken.  Your abdomen will be measured to track your baby's growth.    The fetal heartbeat will be listened to.  Any test results from the previous visit will be discussed. Your health care provider may ask you:  How you are feeling.  If you are feeling the baby move.  If you have had any abnormal symptoms, such as leaking fluid, bleeding, severe headaches, or abdominal cramping.  If you are using any tobacco products, including cigarettes, chewing tobacco, and electronic cigarettes.  If you have any questions. Other tests that may be performed during your second trimester include:  Blood tests that check  for:  Low iron levels (anemia).  Gestational diabetes (between 24 and 28 weeks).  Rh antibodies.  Urine tests to check for infections, diabetes, or protein in the urine.  An ultrasound to confirm the proper growth and development of the baby.  An amniocentesis to check for possible genetic problems.  Fetal screens for spina bifida and Down syndrome.  HIV (human immunodeficiency virus) testing. Routine prenatal testing includes screening for HIV, unless you choose not to have this test. HOME CARE INSTRUCTIONS   Avoid all smoking, herbs, alcohol, and unprescribed drugs. These chemicals affect the formation and growth of the baby.  Do not use any tobacco products, including cigarettes, chewing tobacco, and electronic cigarettes. If you need help quitting, ask your health care provider. You may receive counseling support and other resources to help you quit.  Follow your health care provider's instructions regarding medicine use. There are medicines that are either safe or unsafe to take during pregnancy.  Exercise only as directed by your health care provider. Experiencing uterine cramps is a good sign to stop exercising.  Continue to eat regular, healthy meals.  Wear a good support bra for breast tenderness.  Do not use hot tubs, steam rooms, or saunas.  Wear your seat belt at all times when driving.  Avoid raw meat, uncooked cheese, cat litter boxes, and soil used by cats. These carry germs that can cause birth defects in the baby.  Take your prenatal vitamins.  Take 1500-2000 mg of calcium daily starting at the 20th week of pregnancy until you deliver your baby.  Try taking a stool softener (if your health care provider approves) if you develop constipation. Eat more high-fiber foods, such as fresh vegetables or fruit and whole grains. Drink plenty of fluids to keep your urine clear or pale yellow.  Take warm sitz baths to soothe any pain or discomfort caused by  hemorrhoids. Use hemorrhoid cream if your health care provider approves.  If you develop varicose veins, wear support hose. Elevate your feet for 15 minutes, 3-4 times a day. Limit salt in your diet.  Avoid heavy lifting, wear low heel shoes, and practice good posture.  Rest with your legs elevated if you have leg cramps or low back pain.  Visit your dentist if you have not gone yet during your pregnancy. Use a soft toothbrush to brush your teeth and be gentle when you floss.  A sexual relationship may be continued unless your health care provider directs you otherwise.  Continue to go to all your prenatal visits as directed by your health care provider. SEEK MEDICAL CARE IF:   You have dizziness.  You have mild pelvic cramps, pelvic pressure, or nagging pain in the abdominal area.  You have persistent nausea, vomiting, or diarrhea.  You have a bad smelling vaginal discharge.  You have pain with urination. SEEK IMMEDIATE MEDICAL CARE IF:   You have a fever.  You are leaking fluid from  your vagina.  You have spotting or bleeding from your vagina.  You have severe abdominal cramping or pain.  You have rapid weight gain or loss.  You have shortness of breath with chest pain.  You notice sudden or extreme swelling of your face, hands, ankles, feet, or legs.  You have not felt your baby move in over an hour.  You have severe headaches that do not go away with medicine.  You have vision changes.   This information is not intended to replace advice given to you by your health care provider. Make sure you discuss any questions you have with your health care provider.   Document Released: 11/16/2001 Document Revised: 12/13/2014 Document Reviewed: 01/23/2013 Elsevier Interactive Patient Education [employer].

## 2016-03-30 ENCOUNTER — Encounter: Payer: Self-pay | Admitting: Women's Health

## 2016-03-30 ENCOUNTER — Ambulatory Visit (INDEPENDENT_AMBULATORY_CARE_PROVIDER_SITE_OTHER): Payer: 59 | Admitting: Women's Health

## 2016-03-30 ENCOUNTER — Other Ambulatory Visit: Payer: 59

## 2016-03-30 VITALS — BP 104/62 | HR 68 | Wt 205.0 lb

## 2016-03-30 DIAGNOSIS — Z369 Encounter for antenatal screening, unspecified: Secondary | ICD-10-CM

## 2016-03-30 DIAGNOSIS — Z3402 Encounter for supervision of normal first pregnancy, second trimester: Secondary | ICD-10-CM

## 2016-03-30 DIAGNOSIS — Z1389 Encounter for screening for other disorder: Secondary | ICD-10-CM

## 2016-03-30 DIAGNOSIS — Z331 Pregnant state, incidental: Secondary | ICD-10-CM

## 2016-03-30 DIAGNOSIS — Z3A28 28 weeks gestation of pregnancy: Secondary | ICD-10-CM

## 2016-03-30 DIAGNOSIS — Z131 Encounter for screening for diabetes mellitus: Secondary | ICD-10-CM

## 2016-03-30 LAB — POCT URINALYSIS DIPSTICK
Glucose, UA: NEGATIVE
Ketones, UA: NEGATIVE
Leukocytes, UA: NEGATIVE
NITRITE UA: NEGATIVE
Protein, UA: NEGATIVE
RBC UA: NEGATIVE

## 2016-03-30 NOTE — Progress Notes (Signed)
Low-risk OB appointment G1P0 3245w4d Estimated Date of Delivery: 06/25/16 BP 104/62 mmHg  Pulse 68  Wt 205 lb (92.987 kg)  LMP 09/19/2015  BP, weight, and urine reviewed.  Refer to obstetrical flow sheet for FH & FHR.  Reports good fm.  Denies regular uc's, lof, vb, or uti s/s. No complaints. Reviewed ptl s/s, fkc.  Recommended Tdap at HD/PCP per CDC guidelines.  Plan:  Continue routine obstetrical care  F/U in 3wks for OB appointment  PN2 today

## 2016-03-30 NOTE — Patient Instructions (Signed)

## 2016-03-31 LAB — ANTIBODY SCREEN: Antibody Screen: NEGATIVE

## 2016-03-31 LAB — CBC
Hematocrit: 34.4 % (ref 34.0–46.6)
Hemoglobin: 11.6 g/dL (ref 11.1–15.9)
MCH: 32.9 pg (ref 26.6–33.0)
MCHC: 33.7 g/dL (ref 31.5–35.7)
MCV: 98 fL — ABNORMAL HIGH (ref 79–97)
PLATELETS: 295 10*3/uL (ref 150–379)
RBC: 3.53 x10E6/uL — AB (ref 3.77–5.28)
RDW: 14.4 % (ref 12.3–15.4)
WBC: 10.4 10*3/uL (ref 3.4–10.8)

## 2016-03-31 LAB — GLUCOSE TOLERANCE, 2 HOURS W/ 1HR
GLUCOSE, 1 HOUR: 97 mg/dL (ref 65–179)
Glucose, 2 hour: 109 mg/dL (ref 65–152)
Glucose, Fasting: 69 mg/dL (ref 65–91)

## 2016-03-31 LAB — HIV ANTIBODY (ROUTINE TESTING W REFLEX): HIV Screen 4th Generation wRfx: NONREACTIVE

## 2016-03-31 LAB — RPR: RPR: NONREACTIVE

## 2016-04-11 ENCOUNTER — Encounter (HOSPITAL_COMMUNITY): Payer: Self-pay | Admitting: *Deleted

## 2016-04-11 ENCOUNTER — Emergency Department (HOSPITAL_COMMUNITY)
Admission: EM | Admit: 2016-04-11 | Discharge: 2016-04-11 | Disposition: A | Discharge status: Home / Self Care | Payer: Medicaid Other | Attending: Emergency Medicine | Admitting: Emergency Medicine

## 2016-04-11 DIAGNOSIS — Z79899 Other long term (current) drug therapy: Secondary | ICD-10-CM | POA: Insufficient documentation

## 2016-04-11 DIAGNOSIS — Z87891 Personal history of nicotine dependence: Secondary | ICD-10-CM | POA: Insufficient documentation

## 2016-04-11 DIAGNOSIS — R079 Chest pain, unspecified: Secondary | ICD-10-CM | POA: Diagnosis not present

## 2016-04-11 DIAGNOSIS — R0602 Shortness of breath: Secondary | ICD-10-CM | POA: Insufficient documentation

## 2016-04-11 DIAGNOSIS — R51 Headache: Secondary | ICD-10-CM | POA: Diagnosis present

## 2016-04-11 DIAGNOSIS — J45909 Unspecified asthma, uncomplicated: Secondary | ICD-10-CM | POA: Insufficient documentation

## 2016-04-11 DIAGNOSIS — R519 Headache, unspecified: Secondary | ICD-10-CM

## 2016-04-11 LAB — CBC WITH DIFFERENTIAL/PLATELET
BASOS PCT: 0 %
Basophils Absolute: 0 10*3/uL (ref 0.0–0.1)
EOS ABS: 0.1 10*3/uL (ref 0.0–0.7)
Eosinophils Relative: 1 %
HCT: 32.4 % — ABNORMAL LOW (ref 36.0–46.0)
Hemoglobin: 11.5 g/dL — ABNORMAL LOW (ref 12.0–15.0)
Lymphocytes Relative: 20 %
Lymphs Abs: 2.2 10*3/uL (ref 0.7–4.0)
MCH: 34.7 pg — ABNORMAL HIGH (ref 26.0–34.0)
MCHC: 35.5 g/dL (ref 30.0–36.0)
MCV: 97.9 fL (ref 78.0–100.0)
MONO ABS: 0.7 10*3/uL (ref 0.1–1.0)
MONOS PCT: 6 %
Neutro Abs: 7.8 10*3/uL — ABNORMAL HIGH (ref 1.7–7.7)
Neutrophils Relative %: 73 %
Platelets: 267 10*3/uL (ref 150–400)
RBC: 3.31 MIL/uL — ABNORMAL LOW (ref 3.87–5.11)
RDW: 13.1 % (ref 11.5–15.5)
WBC: 10.8 10*3/uL — ABNORMAL HIGH (ref 4.0–10.5)

## 2016-04-11 LAB — COMPREHENSIVE METABOLIC PANEL
ALT: 11 U/L — AB (ref 14–54)
AST: 18 U/L (ref 15–41)
Albumin: 2.9 g/dL — ABNORMAL LOW (ref 3.5–5.0)
Alkaline Phosphatase: 77 U/L (ref 38–126)
Anion gap: 9 (ref 5–15)
BILIRUBIN TOTAL: 0.2 mg/dL — AB (ref 0.3–1.2)
BUN: 8 mg/dL (ref 6–20)
CALCIUM: 8.8 mg/dL — AB (ref 8.9–10.3)
CHLORIDE: 108 mmol/L (ref 101–111)
CO2: 21 mmol/L — ABNORMAL LOW (ref 22–32)
CREATININE: 0.54 mg/dL (ref 0.44–1.00)
Glucose, Bld: 98 mg/dL (ref 65–99)
Potassium: 3.6 mmol/L (ref 3.5–5.1)
Sodium: 138 mmol/L (ref 135–145)
TOTAL PROTEIN: 6.6 g/dL (ref 6.5–8.1)

## 2016-04-11 MED ORDER — SODIUM CHLORIDE 0.9 % IV BOLUS (SEPSIS)
1000.0000 mL | Freq: Once | INTRAVENOUS | Status: AC
  Administered 2016-04-11: 1000 mL via INTRAVENOUS

## 2016-04-11 MED ORDER — METOCLOPRAMIDE HCL 5 MG/ML IJ SOLN
10.0000 mg | Freq: Once | INTRAMUSCULAR | Status: AC
  Administered 2016-04-11: 10 mg via INTRAVENOUS
  Filled 2016-04-11: qty 2

## 2016-04-11 MED ORDER — DIPHENHYDRAMINE HCL 50 MG/ML IJ SOLN
25.0000 mg | Freq: Once | INTRAMUSCULAR | Status: AC
  Administered 2016-04-11: 25 mg via INTRAVENOUS
  Filled 2016-04-11: qty 1

## 2016-04-11 NOTE — Discharge Instructions (Signed)
Push fluids, stay hydrated.  Tylenol as needed for any recurrent headache.

## 2016-04-11 NOTE — ED Notes (Signed)
Pt ambulated from ED with significant other prior to discharge papers being given.  Discharge instructions given verbally by Dr Fayrene FearingJAmes

## 2016-04-11 NOTE — ED Provider Notes (Addendum)
CSN: [id number]     Arrival date & time 04/11/16  2027 History  By signing my name below, I, Iona BeardChristian Pulliam, attest that this documentation has been prepared under the direction and in the presence of Rolland PorterMark Yentl Verge, MD.   Electronically Signed: Iona Beardhristian Pulliam, ED Scribe. 04/11/2016. 11:27 PM  Chief Complaint  Patient presents with  . Headache    The history is provided by the patient. No language interpreter was used.   HPI Comments: Olivia Fuller is a 30 y.o. female with PMHx of asthma and anxietywho presents to the Emergency Department complaining of gradual onset, 7/10, anterior headache, beginning around 6:30 PM. Pt reports associated visual disturbance to both eyes, "pressure-like" chest pain, and mild shortness of breath. Pt states she took her BP when her symptoms began and it measured 145/81 which is higher than her baseline. No other associated symptoms noted. Pt reports chest pain has improved in ED. No worsening or alleviating factors noted. Pt denies nausea, vomiting, vaginal bleeding, vaginal discharge, leg swelling, neck pain, back pain, hx of migraines, light-headedness, dizziness, or any other pertinent symptoms. Pt is currently [redacted] week pregnant and [facility]   Past Medical History  Diagnosis Date  . Asthma   . Scoliosis   . DDD (degenerative disc disease)   . Anxiety   . UTI (lower urinary tract infection)    Past Surgical History  Procedure Laterality Date  . No surgical history    . Lumbar laminectomy/decompression microdiscectomy  07/06/2012    Procedure: LUMBAR LAMINECTOMY/DECOMPRESSION MICRODISCECTOMY 1 LEVEL;  Surgeon: Cristi LoronJeffrey D Jenkins, MD;  Location: [facility];  Service: Neurosurgery;  Laterality: Left;  LEFT Lumbar Five-Sacral One diskectomy  . Back surgery     Family History  Problem Relation Age of Onset  . Seizures Mother   . Alcohol abuse Mother   . Drug abuse Mother   . Bipolar disorder Father   . Cancer Paternal  Grandmother     colon  . Hyperlipidemia Paternal Grandmother   . Hypertension Paternal Grandmother   . Cancer Paternal Grandfather     prostate, bone  . Diabetes Paternal Grandfather   . Heart disease Paternal Grandfather    Social History  Substance Use Topics  . Smoking status: Former Smoker -- 5 years    Quit date: 02/14/2012  . Smokeless tobacco: None  . Alcohol Use: No   OB History    Gravida Para Term Preterm AB TAB SAB Ectopic Multiple Living   1              Review of Systems  Constitutional: Negative for fever, chills, diaphoresis, appetite change and fatigue.  HENT: Negative for mouth sores, sore throat and trouble swallowing.   Eyes: Positive for visual disturbance.  Respiratory: Positive for shortness of breath. Negative for cough, chest tightness and wheezing.   Cardiovascular: Positive for chest pain.  Gastrointestinal: Negative for nausea, vomiting, abdominal pain, diarrhea and abdominal distention.  Endocrine: Negative for polydipsia, polyphagia and polyuria.  Genitourinary: Negative for dysuria, frequency and hematuria.  Musculoskeletal: Negative for gait problem.  Skin: Negative for color change, pallor and rash.  Neurological: Positive for headaches. Negative for dizziness, syncope and light-headedness.  Hematological: Does not bruise/bleed easily.  Psychiatric/Behavioral: Negative for behavioral problems and confusion.     Allergies  Tylenol and Zofran  Home Medications   Prior to Admission medications   Medication Sig Start Date End Date Taking? Authorizing Provider  Prenatal Vit-Fe Fumarate-FA (  PRENATAL MULTIVITAMIN) TABS tablet Take 1 tablet by mouth daily at 12 noon. Reported on 12/09/2015   Yes Historical Provider, MD  promethazine (PHENERGAN) 25 MG tablet Take 0.5-1 tablets (12.5-25 mg total) by mouth every 6 (six) hours as needed for nausea or vomiting. 01/20/16   Adline Potter, NP   BP 123/73 mmHg  Pulse 98  Temp(Src) 97.3 F (36.3 C)  (Oral)  Resp 20  Ht  (1.702 m)  Wt 205 lb (92.987 kg)  BMI 32.10 kg/m2  SpO2 98%  LMP 09/19/2015 Physical Exam  Constitutional: She is oriented to person, place, and time. She appears well-developed and well-nourished. No distress.  HENT:  Head: Normocephalic.  Eyes: Conjunctivae are normal. Pupils are equal, round, and reactive to light. No scleral icterus.  Neck: Normal range of motion. Neck supple. No thyromegaly present.  Cardiovascular: Normal rate and regular rhythm.  Exam reveals no gallop and no friction rub.   No murmur heard. Pulmonary/Chest: Effort normal and breath sounds normal. No respiratory distress. She has no wheezes. She has no rales.  Abdominal: Soft. Bowel sounds are normal. She exhibits no distension. There is no tenderness. There is no rebound.  Musculoskeletal: Normal range of motion. She exhibits no edema.  No leg swelling noted.   Neurological: She is alert and oriented to person, place, and time.  Skin: Skin is warm and dry. No rash noted.  Psychiatric: She has a normal mood and affect. Her behavior is normal.    ED Course  Procedures (including critical care time) DIAGNOSTIC STUDIES: Oxygen Saturation is 98% on RA, normal by my interpretation.    COORDINATION OF CARE: 9:21 PM Discussed treatment plan which includes CBC with differential/platelet, CMP, urinalysis, and EKG with pt at bedside and pt agreed to plan.  Labs Review Labs Reviewed  CBC WITH DIFFERENTIAL/PLATELET - Abnormal; Notable for the following:    WBC 10.8 (*)    RBC 3.31 (*)    Hemoglobin 11.5 (*)    HCT 32.4 (*)    MCH 34.7 (*)    Neutro Abs 7.8 (*)    All other components within normal limits  COMPREHENSIVE METABOLIC PANEL - Abnormal; Notable for the following:    CO2 21 (*)    Calcium 8.8 (*)    Albumin 2.9 (*)    ALT 11 (*)    Total Bilirubin 0.2 (*)    All other components within normal limits    Imaging Review No results found. I have personally reviewed and  evaluated these lab results as part of my medical decision-making.   EKG Interpretation None      MDM   Final diagnoses:  Acute nonintractable headache, unspecified headache type   Given Benadryl, and Reglan. Headache resolves. Assuring labs. No edema. Not hypertensive here. No clonus. Doubt PIH. Normal platelet and liver enzymes.   I personally performed the services described in this documentation, which was scribed in my presence. The recorded information has been reviewed and is accurate.      Rolland Porter, MD 04/11/16 1610  Rolland Porter, MD 04/12/16 [phone]

## 2016-04-11 NOTE — ED Notes (Signed)
Pt states she was at work when she started to have a headache, with blurry vision and chest pain; pt states she is [redacted] weeks pregnant

## 2016-04-20 ENCOUNTER — Encounter: Payer: Self-pay | Admitting: Women's Health

## 2016-04-20 ENCOUNTER — Ambulatory Visit (INDEPENDENT_AMBULATORY_CARE_PROVIDER_SITE_OTHER): Payer: 59 | Admitting: Women's Health

## 2016-04-20 VITALS — BP 104/58 | HR 76 | Wt 205.0 lb

## 2016-04-20 DIAGNOSIS — Z3403 Encounter for supervision of normal first pregnancy, third trimester: Secondary | ICD-10-CM

## 2016-04-20 DIAGNOSIS — Z331 Pregnant state, incidental: Secondary | ICD-10-CM

## 2016-04-20 DIAGNOSIS — Z1389 Encounter for screening for other disorder: Secondary | ICD-10-CM

## 2016-04-20 DIAGNOSIS — R350 Frequency of micturition: Secondary | ICD-10-CM

## 2016-04-20 LAB — POCT URINALYSIS DIPSTICK
Ketones, UA: NEGATIVE
LEUKOCYTES UA: NEGATIVE
NITRITE UA: NEGATIVE

## 2016-04-20 NOTE — Progress Notes (Signed)
Low-risk OB appointment G1P0 6278w4d Estimated Date of Delivery: 06/25/16 BP 104/58 mmHg  Pulse 76  Wt 205 lb (92.987 kg)  LMP 09/19/2015  BP, weight, and urine reviewed.  Refer to obstetrical flow sheet for FH & FHR.  Reports good fm.  Denies regular uc's, lof, vb, or uti s/s. Went to [facility] weekend before last for elevated bp/headache- normal pre-e labs, dx w/ migraine- hasn't had any ha/sx since. BP great today. Some urinary frequency- will send urine cx.  Reviewed ptl s/s, fkc, pn2 labs. Plan:  Continue routine obstetrical care  F/U in 2wks for OB appointment

## 2016-04-20 NOTE — Patient Instructions (Signed)
Call the office ([phone]) or go to [facility] if:  You begin to have strong, frequent contractions  Your water breaks.  Sometimes it is a big gush of fluid, sometimes it is just a trickle that keeps getting your panties wet or running down your legs  You have vaginal bleeding.  It is normal to have a small amount of spotting if your cervix was checked.   You don't feel your baby moving like normal.  If you don't, get you something to eat and drink and lay down and focus on feeling your baby move.  You should feel at least 10 movements in 2 hours.  If you don't, you should call the office or go to [facility].   Call the office ([phone]) or go to [facility] for these signs of pre-eclampsia:  Severe headache that does not go away with Tylenol  Visual changes- seeing spots, double, blurred vision  Pain under your right breast or upper abdomen that does not go away with Tums or heartburn medicine  Nausea and/or vomiting  Severe swelling in your hands, feet, and face    Preterm Labor Information Preterm labor is when labor starts at less than 37 weeks of pregnancy. The normal length of a pregnancy is 39 to 41 weeks. CAUSES Often, there is no identifiable underlying cause as to why a woman goes into preterm labor. One of the most common known causes of preterm labor is infection. Infections of the uterus, cervix, vagina, amniotic sac, bladder, kidney, or even the lungs (pneumonia) can cause labor to start. Other suspected causes of preterm labor include:   Urogenital infections, such as yeast infections and bacterial vaginosis.   Uterine abnormalities (uterine shape, uterine septum, fibroids, or bleeding from the placenta).   A cervix that has been operated on (it may fail to stay closed).   Malformations in the fetus.   Multiple gestations (twins, triplets, and so on).   Breakage of the amniotic sac.  RISK FACTORS  Having a previous history of preterm  labor.   Having premature rupture of membranes (PROM).   Having a placenta that covers the opening of the cervix (placenta previa).   Having a placenta that separates from the uterus (placental abruption).   Having a cervix that is too weak to hold the fetus in the uterus (incompetent cervix).   Having too much fluid in the amniotic sac (polyhydramnios).   Taking illegal drugs or smoking while pregnant.   Not gaining enough weight while pregnant.   Being younger than 18 and older than 30 years old.   Having a low socioeconomic status.   Being African American. SYMPTOMS Signs and symptoms of preterm labor include:   Menstrual-like cramps, abdominal pain, or back pain.  Uterine contractions that are regular, as frequent as six in an hour, regardless of their intensity (may be mild or painful).  Contractions that start on the top of the uterus and spread down to the lower abdomen and back.   A sense of increased pelvic pressure.   A watery or bloody mucus discharge that comes from the vagina.  TREATMENT Depending on the length of the pregnancy and other circumstances, your health care provider may suggest bed rest. If necessary, there are medicines that can be given to stop contractions and to mature the fetal lungs. If labor happens before 34 weeks of pregnancy, a prolonged hospital stay may be recommended. Treatment depends on the condition of both you and the fetus.  WHAT   SHOULD YOU DO IF YOU THINK YOU ARE IN PRETERM LABOR? Call your health care provider right away. You will need to go to the hospital to get checked immediately. HOW CAN YOU PREVENT PRETERM LABOR IN FUTURE PREGNANCIES? You should:   Stop smoking if you smoke.  Maintain healthy weight gain and avoid chemicals and drugs that are not necessary.  Be watchful for any type of infection.  Inform your health care provider if you have a known history of preterm labor.   This information is not  intended to replace advice given to you by your health care provider. Make sure you discuss any questions you have with your health care provider.   Document Released: 02/12/2004 Document Revised: 07/25/2013 Document Reviewed: 12/25/2012 Elsevier Interactive Patient Education 2016 Elsevier Inc.   

## 2016-04-21 DIAGNOSIS — Z029 Encounter for administrative examinations, unspecified: Secondary | ICD-10-CM

## 2016-04-22 LAB — URINE CULTURE

## 2016-04-26 ENCOUNTER — Encounter (HOSPITAL_COMMUNITY): Payer: Self-pay | Admitting: *Deleted

## 2016-04-26 ENCOUNTER — Inpatient Hospital Stay (HOSPITAL_COMMUNITY)
Admission: AD | Admit: 2016-04-26 | Discharge: 2016-04-26 | Disposition: A | Discharge status: Home / Self Care | Payer: Medicaid Other | Source: Ambulatory Visit | Attending: Obstetrics & Gynecology | Admitting: Obstetrics & Gynecology

## 2016-04-26 ENCOUNTER — Telehealth: Payer: Self-pay | Admitting: *Deleted

## 2016-04-26 DIAGNOSIS — M419 Scoliosis, unspecified: Secondary | ICD-10-CM | POA: Insufficient documentation

## 2016-04-26 DIAGNOSIS — F419 Anxiety disorder, unspecified: Secondary | ICD-10-CM | POA: Insufficient documentation

## 2016-04-26 DIAGNOSIS — Z3A31 31 weeks gestation of pregnancy: Secondary | ICD-10-CM | POA: Diagnosis present

## 2016-04-26 DIAGNOSIS — O99343 Other mental disorders complicating pregnancy, third trimester: Secondary | ICD-10-CM | POA: Insufficient documentation

## 2016-04-26 DIAGNOSIS — O330 Maternal care for disproportion due to deformity of maternal pelvic bones: Secondary | ICD-10-CM | POA: Insufficient documentation

## 2016-04-26 DIAGNOSIS — Z87891 Personal history of nicotine dependence: Secondary | ICD-10-CM | POA: Diagnosis not present

## 2016-04-26 DIAGNOSIS — R030 Elevated blood-pressure reading, without diagnosis of hypertension: Secondary | ICD-10-CM | POA: Diagnosis not present

## 2016-04-26 DIAGNOSIS — O99353 Diseases of the nervous system complicating pregnancy, third trimester: Secondary | ICD-10-CM | POA: Insufficient documentation

## 2016-04-26 DIAGNOSIS — J45909 Unspecified asthma, uncomplicated: Secondary | ICD-10-CM | POA: Diagnosis not present

## 2016-04-26 DIAGNOSIS — O99513 Diseases of the respiratory system complicating pregnancy, third trimester: Secondary | ICD-10-CM | POA: Insufficient documentation

## 2016-04-26 LAB — COMPREHENSIVE METABOLIC PANEL
ALT: 11 U/L — AB (ref 14–54)
AST: 18 U/L (ref 15–41)
Albumin: 2.9 g/dL — ABNORMAL LOW (ref 3.5–5.0)
Alkaline Phosphatase: 105 U/L (ref 38–126)
Anion gap: 12 (ref 5–15)
BILIRUBIN TOTAL: 0.4 mg/dL (ref 0.3–1.2)
BUN: 9 mg/dL (ref 6–20)
CO2: 20 mmol/L — ABNORMAL LOW (ref 22–32)
CREATININE: 0.59 mg/dL (ref 0.44–1.00)
Calcium: 8.6 mg/dL — ABNORMAL LOW (ref 8.9–10.3)
Chloride: 103 mmol/L (ref 101–111)
GFR calc Af Amer: >60 mL/min (ref >60)
Glucose, Bld: 115 mg/dL — ABNORMAL HIGH (ref 65–99)
Potassium: 3.5 mmol/L (ref 3.5–5.1)
Sodium: 135 mmol/L (ref 135–145)
TOTAL PROTEIN: 7 g/dL (ref 6.5–8.1)

## 2016-04-26 LAB — PROTEIN / CREATININE RATIO, URINE
CREATININE, URINE: 281 mg/dL
Protein Creatinine Ratio: 0.11 mg/mg{Cre} (ref 0.00–0.15)
Total Protein, Urine: 31 mg/dL

## 2016-04-26 LAB — CBC
HEMATOCRIT: 33.2 % — AB (ref 36.0–46.0)
Hemoglobin: 11.4 g/dL — ABNORMAL LOW (ref 12.0–15.0)
MCH: 33 pg (ref 26.0–34.0)
MCHC: 34.3 g/dL (ref 30.0–36.0)
MCV: 96.2 fL (ref 78.0–100.0)
Platelets: 305 10*3/uL (ref 150–400)
RBC: 3.45 MIL/uL — ABNORMAL LOW (ref 3.87–5.11)
RDW: 13.1 % (ref 11.5–15.5)
WBC: 11.4 10*3/uL — AB (ref 4.0–10.5)

## 2016-04-26 NOTE — Telephone Encounter (Signed)
[facility]

## 2016-04-26 NOTE — MAU Provider Note (Signed)
First Provider Initiated Contact with Patient 04/26/16 2044      Chief Complaint:    Olivia SpurrHeather M Fuller is  30 y.o. 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  Admittedly does not sit down and rest at all before taking her BP.  Had an incident where she felt like "something was sitting on my chest", resolved. States that she also had an episode of blurry vision.   Obstetrical/Gynecological History: OB History    Gravida Para Term Preterm AB TAB SAB Ectopic Multiple Living   1              Past Medical History: Past Medical History  Diagnosis Date  . Asthma   . Scoliosis   . DDD (degenerative disc disease)   . Anxiety   . UTI (lower urinary tract infection)     Past Surgical History: Past Surgical History  Procedure Laterality Date  . No surgical history    . Lumbar laminectomy/decompression microdiscectomy  07/06/2012    Procedure: LUMBAR LAMINECTOMY/DECOMPRESSION MICRODISCECTOMY 1 LEVEL;  Surgeon: Cristi LoronJeffrey D Jenkins, MD;  Location: [facility];  Service: Neurosurgery;  Laterality: Left;  LEFT Lumbar Five-Sacral One diskectomy  . Back surgery      Family History: Family History  Problem Relation Age of Onset  . Seizures Mother   . Alcohol abuse Mother   . Drug abuse Mother   . Bipolar disorder Father   . Cancer Paternal Grandmother     colon  . Hyperlipidemia Paternal Grandmother   . Hypertension Paternal Grandmother   . Cancer Paternal Grandfather     prostate, bone  . Diabetes Paternal Grandfather   . Heart disease Paternal Grandfather     Social History: Social History  Substance Use Topics  . Smoking status: Former Smoker -- 5 years    Quit date: 02/14/2012  . Smokeless tobacco: None  . Alcohol Use: No    Allergies:  Allergies  Allergen Reactions  . Tylenol [Acetaminophen] Itching and Nausea And Vomiting  . Zofran [Ondansetron Hcl] Hives and Itching    Meds:  Prescriptions  prior to admission  Medication Sig Dispense Refill Last Dose  . calcium carbonate (TUMS - DOSED IN MG ELEMENTAL CALCIUM) 500 MG chewable tablet Chew 2 tablets by mouth daily as needed for indigestion or heartburn.   Past Week at Unknown time  . Prenatal Vit-Fe Fumarate-FA (PRENATAL MULTIVITAMIN) TABS tablet Take 1 tablet by mouth at bedtime. Reported on 12/09/2015   04/25/2016 at Unknown time  . promethazine (PHENERGAN) 25 MG tablet Take 0.5-1 tablets (12.5-25 mg total) by mouth every 6 (six) hours as needed for nausea or vomiting. 30 tablet 1 Past Week at Unknown time    Review of Systems   Constitutional: Negative for fever and chills Eyes: POSITIVE for visual disturbances, blurry, intermittent Respiratory: Negative for dyspnea, has some mild SOB Cardiovascular: Negative for palpitations  Gastrointestinal: Negative for vomiting, diarrhea and constipation Genitourinary: Negative for dysuria and urgency Musculoskeletal: Negative for back pain, joint pain, myalgias.  Normal ROM  Neurological: Negative for dizziness and headaches    Physical Exam  Blood pressure 131/70, pulse 111, temperature 97.7 F (36.5 C), temperature source Oral, resp. rate 16, height 5\' 7"  (1.702 m), weight 92.987 kg (205 lb), last menstrual period 09/19/2015, SpO2 99 %.   Filed Vitals:   04/26/16 2025 04/26/16 2037  BP: 135/71 131/70  Pulse: 103 111  Temp: 97.7  F (36.5 C)   Resp: 16     GENERAL: Well-developed, well-nourished female in no acute distress.  LUNGS: Clear to auscultation bilaterally. O2 sat 99% on RA.  Normal respiratory effort, talks without any SOB HEART: Regular rate and rhythm. ABDOMEN: Soft, nontender, nondistended, gravid.  EXTREMITIES: Nontender, no edema,. DTR's 2+-3+.  Negative Homan's sign, no evidence of DVT FHT:  Baseline rate 140 bpm   Variability moderate  Accelerations present   Decelerations none Contractions: Every 0 mins   Labs: Results for orders placed or performed during  the hospital encounter of 04/26/16 (from the past 24 hour(s))  CBC   Collection Time: 04/26/16  8:51 PM  Result Value Ref Range   WBC 11.4 (H) 4.0 - 10.5 K/uL   RBC 3.45 (L) 3.87 - 5.11 MIL/uL   Hemoglobin 11.4 (L) 12.0 - 15.0 g/dL   HCT 16.1 (L) 09.6 - 04.5 %   MCV 96.2 78.0 - 100.0 fL   MCH 33.0 26.0 - 34.0 pg   MCHC 34.3 30.0 - 36.0 g/dL   RDW 40.9 81.1 - 91.4 %   Platelets 305 150 - 400 K/uL  Comprehensive metabolic panel   Collection Time: 04/26/16  8:51 PM  Result Value Ref Range   Sodium 135 135 - 145 mmol/L   Potassium 3.5 3.5 - 5.1 mmol/L   Chloride 103 101 - 111 mmol/L   CO2 20 (L) 22 - 32 mmol/L   Glucose, Bld 115 (H) 65 - 99 mg/dL   BUN 9 6 - 20 mg/dL   Creatinine, Ser 7.82 0.44 - 1.00 mg/dL   Calcium 8.6 (L) 8.9 - 10.3 mg/dL   Total Protein 7.0 6.5 - 8.1 g/dL   Albumin 2.9 (L) 3.5 - 5.0 g/dL   AST 18 15 - 41 U/L   ALT 11 (L) 14 - 54 U/L   Alkaline Phosphatase 105 38 - 126 U/L   Total Bilirubin 0.4 0.3 - 1.2 mg/dL   GFR calc non Af Amer >60 >60 mL/min   GFR calc Af Amer >60 >60 mL/min   Anion gap 12 5 - 15   Imaging Studies:  No results found.  Assessment: Olivia Fuller is  30 y.o. G1P0 at [redacted]w[redacted]d presents with elevated BP from baseline, not hypertension.  Plan: DC home. Call office in am to go over pr/cr ration. Pt plans part time work to decrease her stress.  Take BP after 10 minutes of sitting down when she feels like she needs to take it. Report >140/90  CRESENZO-DISHMAN,Huan Pollok 5/22/20179:31 PM

## 2016-04-26 NOTE — MAU Note (Signed)
Urine collected and sent to lab.

## 2016-04-26 NOTE — MAU Note (Signed)
Pt reports her b/p has been 140's/80's-100. Also reports blurred vision and shortness of breath.

## 2016-04-26 NOTE — Discharge Instructions (Signed)
How to Take Your Blood Pressure °HOW DO I GET A BLOOD PRESSURE MACHINE? °· You can buy an electronic home blood pressure machine at your local pharmacy. Insurance will sometimes cover the cost if you have a prescription. °· Ask your doctor what type of machine is best for you. There are different machines for your arm and your wrist. °· If you decide to buy a machine to check your blood pressure on your arm, first check the size of your arm so you can buy the right size cuff. To check the size of your arm:   °· Use a measuring tape that shows both inches and centimeters.   °· Wrap the measuring tape around the upper-middle part of your arm. You may need someone to help you measure.   °· Write down your arm measurement in both inches and centimeters.   °· To measure your blood pressure correctly, it is important to have the right size cuff.   °· If your arm is up to 13 inches (up to 34 centimeters), get an adult cuff size. °· If your arm is 13 to 17 inches (35 to 44 centimeters), get a large adult cuff size.   °·  If your arm is 17 to 20 inches (45 to 52 centimeters), get an adult thigh cuff.   °WHAT DO THE NUMBERS MEAN?  °· There are two numbers that make up your blood pressure. For example: 120/80. °· The first number (120 in our example) is called the "systolic pressure." It is a measure of the pressure in your blood vessels when your heart is pumping blood. °· The second number (80 in our example) is called the "diastolic pressure." It is a measure of the pressure in your blood vessels when your heart is resting between beats. °· Your doctor will tell you what your blood pressure should be. °WHAT SHOULD I DO BEFORE I CHECK MY BLOOD PRESSURE?  °· Try to rest or relax for at least 30 minutes before you check your blood pressure. °· Do not smoke. °· Do not have any drinks with caffeine, such as: °· Soda. °· Coffee. °· Tea. °· Check your blood pressure in a quiet room. °· Sit down and stretch out your arm on a table.  Keep your arm at about the level of your heart. Let your arm relax. °· Make sure that your legs are not crossed. °HOW DO I CHECK MY BLOOD PRESSURE? °· Follow the directions that came with your machine. °· Make sure you remove any tight-fitting clothing from your arm or wrist. Wrap the cuff around your upper arm or wrist. You should be able to fit a finger between the cuff and your arm. If you cannot fit a finger between the cuff and your arm, it is too tight and should be removed and rewrapped. °· Some units require you to manually pump up the arm cuff. °· Automatic units inflate the cuff when you press a button. °· Cuff deflation is automatic in both models. °· After the cuff is inflated, the unit measures your blood pressure and pulse. The readings are shown on a monitor. Hold still and breathe normally while the cuff is inflated. °· Getting a reading takes less than a minute. °· Some models store readings in a memory. Some provide a printout of readings. If your machine does not store your readings, keep a written record. °· Take readings with you to your next visit with your doctor. °  °This information is not intended to replace advice given to   you by your health care provider. Make sure you discuss any questions you have with your health care provider.   Document Released: 11/04/2008 Document Revised: 12/13/2014 Document Reviewed: 01/17/2014 Elsevier Interactive Patient Education 2016 [employer]. Hypertension During Pregnancy Hypertension, or high blood pressure, is when there is extra pressure inside your blood vessels that carry blood from the heart to the rest of your body (arteries). It can happen at any time in life, including pregnancy. Hypertension during pregnancy can cause problems for you and your baby. Your baby might not weigh as much as he or she should at birth or might be born early (premature). Very bad cases of hypertension during pregnancy can be life-threatening.  Different types of  hypertension can occur during pregnancy. These include:  Chronic hypertension. This happens when a woman has hypertension before pregnancy and it continues during pregnancy.  Gestational hypertension. This is when hypertension develops during pregnancy.  Preeclampsia or toxemia of pregnancy. This is a very serious type of hypertension that develops only during pregnancy. It affects the whole body and can be very dangerous for both mother and baby.  Gestational hypertension and preeclampsia usually go away after your baby is born. Your blood pressure will likely stabilize within 6 weeks. Women who have hypertension during pregnancy have a greater chance of developing hypertension later in life or with future pregnancies. RISK FACTORS There are certain factors that make it more likely for you to develop hypertension during pregnancy. These include:  Having hypertension before pregnancy.  Having hypertension during a previous pregnancy.  Being overweight.  Being older than 40 years.  Being pregnant with more than one baby.  Having diabetes or kidney problems. SIGNS AND SYMPTOMS Chronic and gestational hypertension rarely cause symptoms. Preeclampsia has symptoms, which may include:  Increased protein in your urine. Your health care provider will check for this at every prenatal visit.  Swelling of your hands and face.  Rapid weight gain.  Headaches.  Visual changes.  Being bothered by light.  Abdominal pain, especially in the upper right area.  Chest pain.  Shortness of breath.  Increased reflexes.  Seizures. These occur with a more severe form of preeclampsia, called eclampsia. DIAGNOSIS  You may be diagnosed with hypertension during a regular prenatal exam. At each prenatal visit, you may have:  Your blood pressure checked.  A urine test to check for protein in your urine. The type of hypertension you are diagnosed with depends on when you developed it. It also  depends on your specific blood pressure reading.  Developing hypertension before 20 weeks of pregnancy is consistent with chronic hypertension.  Developing hypertension after 20 weeks of pregnancy is consistent with gestational hypertension.  Hypertension with increased urinary protein is diagnosed as preeclampsia.  Blood pressure measurements that stay above 160 systolic or 110 diastolic are a sign of severe preeclampsia. TREATMENT Treatment for hypertension during pregnancy varies. Treatment depends on the type of hypertension and how serious it is.  If you take medicine for chronic hypertension, you may need to switch medicines.  Medicines called ACE inhibitors should not be taken during pregnancy.  Low-dose aspirin may be suggested for women who have risk factors for preeclampsia.  If you have gestational hypertension, you may need to take a blood pressure medicine that is safe during pregnancy. Your health care provider will recommend the correct medicine.  If you have severe preeclampsia, you may need to be in the hospital. Health care providers will watch you and your baby  very closely. You also may need to take medicine called magnesium sulfate to prevent seizures and lower blood pressure.  Sometimes, an early delivery is needed. This may be the case if the condition worsens. It would be done to protect you and your baby. The only cure for preeclampsia is delivery.  Your health care provider may recommend that you take one low-dose aspirin (81 mg) each day to help prevent high blood pressure during your pregnancy if you are at risk for preeclampsia. You may be at risk for preeclampsia if:  You had preeclampsia or eclampsia during a previous pregnancy.  Your baby did not grow as expected during a previous pregnancy.  You experienced preterm birth with a previous pregnancy.  You experienced a separation of the placenta from the uterus (placental abruption) during a previous  pregnancy.  You experienced the loss of your baby during a previous pregnancy.  You are pregnant with more than one baby.  You have other medical conditions, such as diabetes or an autoimmune disease. HOME CARE INSTRUCTIONS  Schedule and keep all of your regular prenatal care appointments. This is important.  Take medicines only as directed by your health care provider. Tell your health care provider about all medicines you take.  Eat as little salt as possible.  Get regular exercise.  Do not drink alcohol.  Do not use tobacco products.  Do not drink products with caffeine.  Lie on your left side when resting. SEEK IMMEDIATE MEDICAL CARE IF:  You have severe abdominal pain.  You have sudden swelling in your hands, ankles, or face.  You gain 4 pounds (1.8 kg) or more in 1 week.  You vomit repeatedly.  You have vaginal bleeding.  You do not feel your baby moving as much.  You have a headache.  You have blurred or double vision.  You have muscle twitching or spasms.  You have shortness of breath.  You have blue fingernails or lips.  You have blood in your urine. MAKE SURE YOU:  Understand these instructions.  Will watch your condition.  Will get help right away if you are not doing well or get worse.   This information is not intended to replace advice given to you by your health care provider. Make sure you discuss any questions you have with your health care provider.   Document Released: 08/10/2011 Document Revised: 12/13/2014 Document Reviewed: 06/21/2013 Elsevier Interactive Patient Education [employer]2016 Elsevier Inc.

## 2016-04-27 ENCOUNTER — Encounter: Payer: Self-pay | Admitting: Advanced Practice Midwife

## 2016-04-27 ENCOUNTER — Telehealth: Payer: Self-pay | Admitting: Obstetrics & Gynecology

## 2016-04-27 NOTE — Telephone Encounter (Signed)
Pt called stating that she went to the ER yesterday and was told by someone in the ER for her to call in today and get the results of her Urine test.Please contact pt

## 2016-04-27 NOTE — Telephone Encounter (Signed)
Informed of normal urine results per Joellyn HaffKim Booker, CNM.  Pt verbalized understanding.

## 2016-05-04 ENCOUNTER — Encounter: Payer: 59 | Admitting: Advanced Practice Midwife

## 2016-05-07 ENCOUNTER — Encounter: Payer: Self-pay | Admitting: Obstetrics & Gynecology

## 2016-05-07 ENCOUNTER — Ambulatory Visit (INDEPENDENT_AMBULATORY_CARE_PROVIDER_SITE_OTHER): Payer: 59 | Admitting: Obstetrics & Gynecology

## 2016-05-07 VITALS — BP 120/70 | HR 76 | Wt 206.0 lb

## 2016-05-07 DIAGNOSIS — M549 Dorsalgia, unspecified: Secondary | ICD-10-CM

## 2016-05-07 DIAGNOSIS — Z331 Pregnant state, incidental: Secondary | ICD-10-CM

## 2016-05-07 DIAGNOSIS — O99891 Other specified diseases and conditions complicating pregnancy: Secondary | ICD-10-CM

## 2016-05-07 DIAGNOSIS — O9989 Other specified diseases and conditions complicating pregnancy, childbirth and the puerperium: Secondary | ICD-10-CM

## 2016-05-07 DIAGNOSIS — Z3403 Encounter for supervision of normal first pregnancy, third trimester: Secondary | ICD-10-CM

## 2016-05-07 DIAGNOSIS — Z3A33 33 weeks gestation of pregnancy: Secondary | ICD-10-CM

## 2016-05-07 DIAGNOSIS — Z1389 Encounter for screening for other disorder: Secondary | ICD-10-CM

## 2016-05-07 DIAGNOSIS — O26893 Other specified pregnancy related conditions, third trimester: Secondary | ICD-10-CM

## 2016-05-07 LAB — POCT URINALYSIS DIPSTICK
Glucose, UA: 2
Ketones, UA: NEGATIVE
LEUKOCYTES UA: NEGATIVE
NITRITE UA: NEGATIVE
RBC UA: NEGATIVE

## 2016-05-07 NOTE — Progress Notes (Signed)
G1P0 2226w0d Estimated Date of Delivery: 06/25/16  Blood pressure 120/70, pulse 76, weight 206 lb (93.441 kg), last menstrual period 09/19/2015.   BP weight and urine results all reviewed and noted.  Please refer to the obstetrical flow sheet for the fundal height and fetal heart rate documentation:  Patient reports good fetal movement, denies any bleeding and no rupture of membranes symptoms or regular contractions. Patient is without complaints. All questions were answered.  Orders Placed This Encounter  Procedures  . POCT urinalysis dipstick    Plan:  Continued routine obstetrical care,   Has triggers in the right lower back/PSIS area, local care instructions given  Return in about 2 weeks (around 05/21/2016) for LROB.

## 2016-05-18 ENCOUNTER — Encounter: Payer: Self-pay | Admitting: Advanced Practice Midwife

## 2016-05-18 ENCOUNTER — Ambulatory Visit (INDEPENDENT_AMBULATORY_CARE_PROVIDER_SITE_OTHER): Payer: 59 | Admitting: Advanced Practice Midwife

## 2016-05-18 VITALS — BP 110/70 | HR 114 | Wt 208.5 lb

## 2016-05-18 DIAGNOSIS — Z1389 Encounter for screening for other disorder: Secondary | ICD-10-CM

## 2016-05-18 DIAGNOSIS — Z3403 Encounter for supervision of normal first pregnancy, third trimester: Secondary | ICD-10-CM

## 2016-05-18 DIAGNOSIS — Z3A35 35 weeks gestation of pregnancy: Secondary | ICD-10-CM

## 2016-05-18 DIAGNOSIS — Z331 Pregnant state, incidental: Secondary | ICD-10-CM

## 2016-05-18 LAB — POCT URINALYSIS DIPSTICK
Glucose, UA: NEGATIVE
KETONES UA: NEGATIVE
Leukocytes, UA: NEGATIVE
Nitrite, UA: NEGATIVE
RBC UA: NEGATIVE

## 2016-05-18 NOTE — Progress Notes (Signed)
G1P0 4265w4d Estimated Date of Delivery: 06/25/16  Blood pressure 110/70, pulse 114, weight 208 lb 8 oz (94.575 kg), last menstrual period 09/19/2015.   BP weight and urine results all reviewed and noted.  Please refer to the obstetrical flow sheet for the fundal height and fetal heart rate documentation:  Patient reports good fetal movement, denies any bleeding and no rupture of membranes symptoms or regular contractions. Patient is without complaints. All questions were answered.  Orders Placed This Encounter  Procedures  . POCT urinalysis dipstick    Plan:  Continued routine obstetrical care,   Return in about 2 weeks (around 06/01/2016) for LROB.

## 2016-05-23 ENCOUNTER — Encounter (HOSPITAL_COMMUNITY): Payer: Self-pay | Admitting: *Deleted

## 2016-05-23 ENCOUNTER — Inpatient Hospital Stay (HOSPITAL_COMMUNITY)
Admission: AD | Admit: 2016-05-23 | Discharge: 2016-05-23 | Disposition: A | Discharge status: Home / Self Care | Payer: Medicaid Other | Source: Ambulatory Visit | Attending: Obstetrics and Gynecology | Admitting: Obstetrics and Gynecology

## 2016-05-23 DIAGNOSIS — Z3A35 35 weeks gestation of pregnancy: Secondary | ICD-10-CM | POA: Insufficient documentation

## 2016-05-23 DIAGNOSIS — Z87440 Personal history of urinary (tract) infections: Secondary | ICD-10-CM | POA: Insufficient documentation

## 2016-05-23 DIAGNOSIS — Z3403 Encounter for supervision of normal first pregnancy, third trimester: Secondary | ICD-10-CM

## 2016-05-23 DIAGNOSIS — R102 Pelvic and perineal pain: Secondary | ICD-10-CM | POA: Diagnosis present

## 2016-05-23 DIAGNOSIS — Z87891 Personal history of nicotine dependence: Secondary | ICD-10-CM | POA: Diagnosis not present

## 2016-05-23 DIAGNOSIS — O1203 Gestational edema, third trimester: Secondary | ICD-10-CM

## 2016-05-23 LAB — URINALYSIS, ROUTINE W REFLEX MICROSCOPIC
Bilirubin Urine: NEGATIVE
GLUCOSE, UA: NEGATIVE mg/dL
Hgb urine dipstick: NEGATIVE
KETONES UR: 15 mg/dL — AB
LEUKOCYTES UA: NEGATIVE
NITRITE: NEGATIVE
PROTEIN: NEGATIVE mg/dL
Specific Gravity, Urine: >1.03 — ABNORMAL HIGH (ref 1.005–1.030)
pH: 6 (ref 5.0–8.0)

## 2016-05-23 LAB — WET PREP, GENITAL
Clue Cells Wet Prep HPF POC: NONE SEEN
Sperm: NONE SEEN
Trich, Wet Prep: NONE SEEN
Yeast Wet Prep HPF POC: NONE SEEN

## 2016-05-23 NOTE — MAU Note (Signed)
Discussed U/A results and the need for improved hydration. Given crackers and ice water.

## 2016-05-23 NOTE — MAU Note (Signed)
States she was put on light duty at work, but "they're not buying it." Would like to be taken out of work at this time.

## 2016-05-23 NOTE — Discharge Instructions (Signed)
Edema °Edema is an abnormal buildup of fluids. It is more common in your legs and thighs. Painless swelling of the feet and ankles is more likely as a person ages. It also is common in looser skin, like around your eyes. °HOME CARE  °· Keep the affected body part above the level of the heart while lying down. °· Do not sit still or stand for a long time. °· Do not put anything right under your knees when you lie down. °· Do not wear tight clothes on your upper legs. °· Exercise your legs to help the puffiness (swelling) go down. °· Wear elastic bandages or support stockings as told by your doctor. °· A low-salt diet may help lessen the puffiness. °· Only take medicine as told by your doctor. °GET HELP IF: °· Treatment is not working. °· You have heart, liver, or kidney disease and notice that your skin looks puffy or shiny. °· You have puffiness in your legs that does not get better when you raise your legs. °· You have sudden weight gain for no reason. °GET HELP RIGHT AWAY IF:  °· You have shortness of breath or chest pain. °· You cannot breathe when you lie down. °· You have pain, redness, or warmth in the areas that are puffy. °· You have heart, liver, or kidney disease and get edema all of a sudden. °· You have a fever and your symptoms get worse all of a sudden. °MAKE SURE YOU:  °· Understand these instructions. °· Will watch your condition. °· Will get help right away if you are not doing well or get worse. °  °This information is not intended to replace advice given to you by your health care provider. Make sure you discuss any questions you have with your health care provider. °  °Document Released: 05/10/2008 Document Revised: 11/27/2013 Document Reviewed: 09/14/2013 °Elsevier Interactive Patient Education ©2016 Elsevier Inc. ° °

## 2016-05-23 NOTE — MAU Note (Signed)
Pt C/O swelling & numbness in hands, feet since last night.  Has progressed into arms, legs, & face today.  Has lower abd pain that also started last night, had clear/white discharge last night, none today.  Denies bleeding or LOF.

## 2016-05-23 NOTE — MAU Provider Note (Signed)
History   G1 @ 35.2 wks in with c/o pelvic pressure and swellinf in hands with numbness and tingling that started yesterday.  CSN: [id number]  Arrival date & time 05/23/16  0945   None     Chief Complaint  Patient presents with  . Leg Swelling  . Numbness  . Abdominal Pain    HPI  Past Medical History  Diagnosis Date  . Asthma   . Scoliosis   . DDD (degenerative disc disease)   . Anxiety   . UTI (lower urinary tract infection)     Past Surgical History  Procedure Laterality Date  . No surgical history    . Lumbar laminectomy/decompression microdiscectomy  07/06/2012    Procedure: LUMBAR LAMINECTOMY/DECOMPRESSION MICRODISCECTOMY 1 LEVEL;  Surgeon: Cristi Loron, MD;  Location: [facility];  Service: Neurosurgery;  Laterality: Left;  LEFT Lumbar Five-Sacral One diskectomy  . Back surgery      Family History  Problem Relation Age of Onset  . Seizures Mother   . Alcohol abuse Mother   . Drug abuse Mother   . Bipolar disorder Father   . Cancer Paternal Grandmother     colon  . Hyperlipidemia Paternal Grandmother   . Hypertension Paternal Grandmother   . Cancer Paternal Grandfather     prostate, bone  . Diabetes Paternal Grandfather   . Heart disease Paternal Grandfather     Social History  Substance Use Topics  . Smoking status: Former Smoker -- 5 years    Quit date: 02/14/2012  . Smokeless tobacco: None  . Alcohol Use: No    OB History    Gravida Para Term Preterm AB TAB SAB Ectopic Multiple Living   1         0      Review of Systems  Constitutional: Negative.   HENT: Negative.   Eyes: Negative.   Respiratory: Negative.   Cardiovascular: Negative.   Gastrointestinal: Negative.   Endocrine: Negative.   Musculoskeletal: Negative.   Skin: Negative.   Neurological: Negative.   Hematological: Negative.   Psychiatric/Behavioral: Negative.     Allergies  Tylenol and Zofran  Home Medications  No current outpatient prescriptions on file.  BP  108/70 mmHg  Pulse 107  Temp(Src) 97.8 F (36.6 C) (Oral)  Resp 16  LMP 09/19/2015  Physical Exam  Constitutional: She is oriented to person, place, and time. She appears well-developed and well-nourished.  HENT:  Head: Normocephalic.  Eyes: Pupils are equal, round, and reactive to light.  Neck: Normal range of motion.  Cardiovascular: Normal rate, regular rhythm, normal heart sounds and intact distal pulses.   Pulmonary/Chest: Effort normal and breath sounds normal.  Abdominal: Soft. Bowel sounds are normal.  Genitourinary: Vagina normal and uterus normal.  Musculoskeletal: Normal range of motion.  Neurological: She is alert and oriented to person, place, and time. She has normal reflexes.  Skin: Skin is warm and dry.  Psychiatric: She has a normal mood and affect. Her behavior is normal. Judgment and thought content normal.    MAU Course  Procedures (including critical care time)  Labs Reviewed  URINALYSIS, ROUTINE W REFLEX MICROSCOPIC (NOT AT [facility]) - Abnormal; Notable for the following:    Specific Gravity, Urine >1.030 (*)    Ketones, ur 15 (*)    All other components within normal limits  WET PREP, GENITAL   No results found.   1. Encounter for supervision of normal first pregnancy in third trimester  MDM  Wet prep neg. SVE cl/th/post/high. [facility] Will d/c home

## 2016-05-24 ENCOUNTER — Telehealth: Payer: Self-pay | Admitting: Women's Health

## 2016-05-25 ENCOUNTER — Ambulatory Visit (INDEPENDENT_AMBULATORY_CARE_PROVIDER_SITE_OTHER): Payer: 59 | Admitting: Obstetrics and Gynecology

## 2016-05-25 ENCOUNTER — Encounter: Payer: Self-pay | Admitting: Obstetrics and Gynecology

## 2016-05-25 VITALS — BP 100/70 | HR 80 | Wt 214.0 lb

## 2016-05-25 DIAGNOSIS — Z331 Pregnant state, incidental: Secondary | ICD-10-CM

## 2016-05-25 DIAGNOSIS — O1203 Gestational edema, third trimester: Secondary | ICD-10-CM

## 2016-05-25 DIAGNOSIS — Z1389 Encounter for screening for other disorder: Secondary | ICD-10-CM

## 2016-05-25 DIAGNOSIS — Z3403 Encounter for supervision of normal first pregnancy, third trimester: Secondary | ICD-10-CM

## 2016-05-25 DIAGNOSIS — Z3A36 36 weeks gestation of pregnancy: Secondary | ICD-10-CM

## 2016-05-25 LAB — POCT URINALYSIS DIPSTICK
GLUCOSE UA: NEGATIVE
Ketones, UA: NEGATIVE
LEUKOCYTES UA: NEGATIVE
Nitrite, UA: NEGATIVE
Protein, UA: 1
RBC UA: NEGATIVE

## 2016-05-25 NOTE — Patient Instructions (Signed)
Begin mat FMLA

## 2016-05-25 NOTE — Telephone Encounter (Signed)
Pt given an appt today with Dr.Ferguson for evaluation.

## 2016-05-25 NOTE — Progress Notes (Signed)
G1P0 3392w4d Estimated Date of Delivery: 06/25/16 LROB  Patient reports   good fetal movement, denies any bleeding and no rupture of membranes symptoms or regular contractions. 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 Not tx needed. Takes tylenol Vision normal Blood pressure 100/70, pulse 80, weight 214 lb (97.07 kg), last menstrual period 09/19/2015.  refer to the ob flow sheet for FH and FHR, also BP, Wt, Urine results:notable for 1+ protein. Reflexes 1+ Edema 3+ pretibial                          Physical Examination: General appearance - alert, well appearing, and in no distress and overweight                                      Abdomen - FH 36 ,                                                         -FHR 140                                                         soft, nontender, nondistended, no masses or organomegaly                                      Pelvic -                                             Questions were answered. Assessment: LROB G1P0 @ 7392w4d Edema of late pregnancy 3+-4+ pretibial  Plan:  Continued routine obstetrical care, take out of work Fu/weekly  F/u in 1 weeks for bp checks. Has appt tues.

## 2016-05-25 NOTE — Telephone Encounter (Signed)
Pt states at last visit Drenda FreezeFran put her on light duty at work because her job was still having her lift, push and pull > 25 lbs.  Pt states her job is still having her pushing and pulling > 25 lbs, this weekend she went to ED with pelvic pressure and legs swelling and hurting and she was told she has dilated a fingertip.  Pt wants to come out of work completely until delivery and her employer wants a note from us.  Her next follow up with us is on 6/27 with Selena BattenKim.

## 2016-05-30 ENCOUNTER — Encounter (HOSPITAL_COMMUNITY): Payer: Self-pay | Admitting: Emergency Medicine

## 2016-05-30 ENCOUNTER — Other Ambulatory Visit: Payer: Self-pay

## 2016-05-30 ENCOUNTER — Emergency Department (HOSPITAL_COMMUNITY)
Admission: EM | Admit: 2016-05-30 | Discharge: 2016-05-30 | Disposition: A | Discharge status: Home / Self Care | Payer: Medicaid Other | Attending: Emergency Medicine | Admitting: Emergency Medicine

## 2016-05-30 DIAGNOSIS — Z87891 Personal history of nicotine dependence: Secondary | ICD-10-CM | POA: Insufficient documentation

## 2016-05-30 DIAGNOSIS — Y929 Unspecified place or not applicable: Secondary | ICD-10-CM | POA: Insufficient documentation

## 2016-05-30 DIAGNOSIS — Z3A36 36 weeks gestation of pregnancy: Secondary | ICD-10-CM | POA: Insufficient documentation

## 2016-05-30 DIAGNOSIS — S61052A Open bite of left thumb without damage to nail, initial encounter: Secondary | ICD-10-CM | POA: Diagnosis not present

## 2016-05-30 DIAGNOSIS — W57XXXA Bitten or stung by nonvenomous insect and other nonvenomous arthropods, initial encounter: Secondary | ICD-10-CM | POA: Insufficient documentation

## 2016-05-30 DIAGNOSIS — Y939 Activity, unspecified: Secondary | ICD-10-CM | POA: Insufficient documentation

## 2016-05-30 DIAGNOSIS — Y999 Unspecified external cause status: Secondary | ICD-10-CM | POA: Diagnosis not present

## 2016-05-30 DIAGNOSIS — O9A213 Injury, poisoning and certain other consequences of external causes complicating pregnancy, third trimester: Secondary | ICD-10-CM | POA: Insufficient documentation

## 2016-05-30 DIAGNOSIS — J45909 Unspecified asthma, uncomplicated: Secondary | ICD-10-CM | POA: Insufficient documentation

## 2016-05-30 DIAGNOSIS — T63003A Toxic effect of unspecified snake venom, assault, initial encounter: Secondary | ICD-10-CM

## 2016-05-30 DIAGNOSIS — Z3490 Encounter for supervision of normal pregnancy, unspecified, unspecified trimester: Secondary | ICD-10-CM
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
## 2016-05-30 NOTE — Discharge Instructions (Signed)
Call [facility] to make sure your tetanus is up-to-date. This visit is not you will need a tetanus shot. Can return here to get it.  Return for any signs of infection developing at the thumb. Recommend soaking the thumb twice daily in warm water. Apply antibiotic ointment and a Band-Aid.

## 2016-05-30 NOTE — ED Notes (Signed)
Heart rate alarming 140 on monitor with pulse ox.  ekg done.  Pt's states her heart rate has been in the 120's her whole pregnancy.  Dr. Deretha EmoryZackowski notified.

## 2016-06-01 ENCOUNTER — Encounter: Payer: Self-pay | Admitting: Women's Health

## 2016-06-01 ENCOUNTER — Telehealth: Payer: Self-pay | Admitting: Women's Health

## 2016-06-01 ENCOUNTER — Ambulatory Visit (INDEPENDENT_AMBULATORY_CARE_PROVIDER_SITE_OTHER): Payer: 59 | Admitting: Women's Health

## 2016-06-01 VITALS — BP 118/70 | HR 64 | Wt 214.0 lb

## 2016-06-01 DIAGNOSIS — Z118 Encounter for screening for other infectious and parasitic diseases: Secondary | ICD-10-CM

## 2016-06-01 DIAGNOSIS — Z331 Pregnant state, incidental: Secondary | ICD-10-CM

## 2016-06-01 DIAGNOSIS — Z3403 Encounter for supervision of normal first pregnancy, third trimester: Secondary | ICD-10-CM

## 2016-06-01 DIAGNOSIS — Z3685 Encounter for antenatal screening for Streptococcus B: Secondary | ICD-10-CM

## 2016-06-01 DIAGNOSIS — Z1159 Encounter for screening for other viral diseases: Secondary | ICD-10-CM

## 2016-06-01 DIAGNOSIS — Z1389 Encounter for screening for other disorder: Secondary | ICD-10-CM

## 2016-06-01 LAB — POCT URINALYSIS DIPSTICK
Blood, UA: NEGATIVE
Glucose, UA: NEGATIVE
Ketones, UA: NEGATIVE
LEUKOCYTES UA: NEGATIVE
NITRITE UA: NEGATIVE

## 2016-06-01 LAB — OB RESULTS CONSOLE GBS: GBS: NEGATIVE
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
## 2016-06-01 NOTE — Telephone Encounter (Signed)
Pt called, states she went to br after leaving here- had cx checked this am-and had blood in panties and ran down her leg. Seems to be better now. Good fm. Instructed her to go back to br now and wipe, if blood continues to leak into toilet or is running down legs when she stands, to come back now to be evaluated. If bleeding has subsided, doesn't need to come back, but if decreased fm, lof, vb resumes, or labor to call us/go to whog. Verbalized understanding.  Cheral MarkerKimberly R. Kenitha Glendinning, CNM, [facility]BC 06/01/2016 10:42 AM

## 2016-06-01 NOTE — Patient Instructions (Signed)
Call the office ([phone]) or go to [facility] if:  You begin to have strong, frequent contractions  Your water breaks.  Sometimes it is a big gush of fluid, sometimes it is just a trickle that keeps getting your panties wet or running down your legs  You have vaginal bleeding.  It is normal to have a small amount of spotting if your cervix was checked.   You don't feel your baby moving like normal.  If you don't, get you something to eat and drink and lay down and focus on feeling your baby move.  You should feel at least 10 movements in 2 hours.  If you don't, you should call the office or go to [facility].    Preterm Labor Information Preterm labor is when labor starts at less than 37 weeks of pregnancy. The normal length of a pregnancy is 39 to 41 weeks. CAUSES Often, there is no identifiable underlying cause as to why a woman goes into preterm labor. One of the most common known causes of preterm labor is infection. Infections of the uterus, cervix, vagina, amniotic sac, bladder, kidney, or even the lungs (pneumonia) can cause labor to start. Other suspected causes of preterm labor include:   Urogenital infections, such as yeast infections and bacterial vaginosis.   Uterine abnormalities (uterine shape, uterine septum, fibroids, or bleeding from the placenta).   A cervix that has been operated on (it may fail to stay closed).   Malformations in the fetus.   Multiple gestations (twins, triplets, and so on).   Breakage of the amniotic sac.  RISK FACTORS  Having a previous history of preterm labor.   Having premature rupture of membranes (PROM).   Having a placenta that covers the opening of the cervix (placenta previa).   Having a placenta that separates from the uterus (placental abruption).   Having a cervix that is too weak to hold the fetus in the uterus (incompetent cervix).   Having too much fluid in the amniotic sac (polyhydramnios).   Taking  illegal drugs or smoking while pregnant.   Not gaining enough weight while pregnant.   Being younger than 18 and older than 30 years old.   Having a low socioeconomic status.   Being African American. SYMPTOMS Signs and symptoms of preterm labor include:   Menstrual-like cramps, abdominal pain, or back pain.  Uterine contractions that are regular, as frequent as six in an hour, regardless of their intensity (may be mild or painful).  Contractions that start on the top of the uterus and spread down to the lower abdomen and back.   A sense of increased pelvic pressure.   A watery or bloody mucus discharge that comes from the vagina.  TREATMENT Depending on the length of the pregnancy and other circumstances, your health care provider may suggest bed rest. If necessary, there are medicines that can be given to stop contractions and to mature the fetal lungs. If labor happens before 34 weeks of pregnancy, a prolonged hospital stay may be recommended. Treatment depends on the condition of both you and the fetus.  WHAT SHOULD YOU DO IF YOU THINK YOU ARE IN PRETERM LABOR? Call your health care provider right away. You will need to go to the hospital to get checked immediately. HOW CAN YOU PREVENT PRETERM LABOR IN FUTURE PREGNANCIES? You should:   Stop smoking if you smoke.  Maintain healthy weight gain and avoid chemicals and drugs that are not necessary.  Be watchful for   any type of infection.  Inform your health care provider if you have a known history of preterm labor.   This information is not intended to replace advice given to you by your health care provider. Make sure you discuss any questions you have with your health care provider.   Document Released: 02/12/2004 Document Revised: 07/25/2013 Document Reviewed: 12/25/2012 Elsevier Interactive Patient Education 2016 Elsevier Inc.  

## 2016-06-02 LAB — GC/CHLAMYDIA PROBE AMP
Chlamydia trachomatis, NAA: NEGATIVE
NEISSERIA GONORRHOEAE BY PCR: NEGATIVE

## 2016-06-03 LAB — STREP GP B NAA: Strep Gp B NAA: NEGATIVE

## 2016-06-07 ENCOUNTER — Ambulatory Visit (INDEPENDENT_AMBULATORY_CARE_PROVIDER_SITE_OTHER): Payer: 59 | Admitting: Women's Health

## 2016-06-07 ENCOUNTER — Encounter: Payer: Self-pay | Admitting: Women's Health

## 2016-06-07 VITALS — BP 110/70 | HR 124 | Wt 218.0 lb

## 2016-06-07 DIAGNOSIS — Z331 Pregnant state, incidental: Secondary | ICD-10-CM

## 2016-06-07 DIAGNOSIS — Z1389 Encounter for screening for other disorder: Secondary | ICD-10-CM

## 2016-06-07 DIAGNOSIS — Z3A38 38 weeks gestation of pregnancy: Secondary | ICD-10-CM

## 2016-06-07 DIAGNOSIS — Z3403 Encounter for supervision of normal first pregnancy, third trimester: Secondary | ICD-10-CM

## 2016-06-07 LAB — POCT URINALYSIS DIPSTICK
Glucose, UA: NEGATIVE
KETONES UA: NEGATIVE
Leukocytes, UA: NEGATIVE
Nitrite, UA: NEGATIVE
RBC UA: NEGATIVE

## 2016-06-07 NOTE — Patient Instructions (Signed)
Call the office ([phone]) or go to [facility] if:  You begin to have strong, frequent contractions  Your water breaks.  Sometimes it is a big gush of fluid, sometimes it is just a trickle that keeps getting your panties wet or running down your legs  You have vaginal bleeding.  It is normal to have a small amount of spotting if your cervix was checked.   You don't feel your baby moving like normal.  If you don't, get you something to eat and drink and lay down and focus on feeling your baby move.  You should feel at least 10 movements in 2 hours.  If you don't, you should call the office or go to [facility].    Braxton Hicks Contractions Contractions of the uterus can occur throughout pregnancy. Contractions are not always a sign that you are in labor.  WHAT ARE BRAXTON HICKS CONTRACTIONS?  Contractions that occur before labor are called Braxton Hicks contractions, or false labor. Toward the end of pregnancy (32-34 weeks), these contractions can develop more often and may become more forceful. This is not true labor because these contractions do not result in opening (dilatation) and thinning of the cervix. They are sometimes difficult to tell apart from true labor because these contractions can be forceful and people have different pain tolerances. You should not feel embarrassed if you go to the hospital with false labor. Sometimes, the only way to tell if you are in true labor is for your health care provider to look for changes in the cervix. If there are no prenatal problems or other health problems associated with the pregnancy, it is completely safe to be sent home with false labor and await the onset of true labor. HOW CAN YOU TELL THE DIFFERENCE BETWEEN TRUE AND FALSE LABOR? False Labor  The contractions of false labor are usually shorter and not as hard as those of true labor.   The contractions are usually irregular.   The contractions are often felt in the front of  the lower abdomen and in the groin.   The contractions may go away when you walk around or change positions while lying down.   The contractions get weaker and are shorter lasting as time goes on.   The contractions do not usually become progressively stronger, regular, and closer together as with true labor.  True Labor  Contractions in true labor last 30-70 seconds, become very regular, usually become more intense, and increase in frequency.   The contractions do not go away with walking.   The discomfort is usually felt in the top of the uterus and spreads to the lower abdomen and low back.   True labor can be determined by your health care provider with an exam. This will show that the cervix is dilating and getting thinner.  WHAT TO REMEMBER  Keep up with your usual exercises and follow other instructions given by your health care provider.   Take medicines as directed by your health care provider.   Keep your regular prenatal appointments.   Eat and drink lightly if you think you are going into labor.   If Braxton Hicks contractions are making you uncomfortable:   Change your position from lying down or resting to walking, or from walking to resting.   Sit and rest in a tub of warm water.   Drink 2-3 glasses of water. Dehydration may cause these contractions.   Do slow and deep breathing several times an hour.    WHEN SHOULD I SEEK IMMEDIATE MEDICAL CARE? Seek immediate medical care if:  Your contractions become stronger, more regular, and closer together.   You have fluid leaking or gushing from your vagina.   You have a fever.   You pass blood-tinged mucus.   You have vaginal bleeding.   You have continuous abdominal pain.   You have low back pain that you never had before.   You feel your baby's head pushing down and causing pelvic pressure.   Your baby is not moving as much as it used to.    This information is not intended to  replace advice given to you by your health care provider. Make sure you discuss any questions you have with your health care provider.   Document Released: 11/22/2005 Document Revised: 11/27/2013 Document Reviewed: 09/03/2013 Elsevier Interactive Patient Education 2016 Elsevier Inc.  

## 2016-06-07 NOTE — Progress Notes (Signed)
Low-risk OB appointment G1P0 2440w3d Estimated Date of Delivery: 06/25/16 BP 110/70 mmHg  Pulse 124  Wt 218 lb (98.884 kg)  LMP 09/19/2015  BP, weight, and urine reviewed.  Refer to obstetrical flow sheet for FH & FHR.  Reports good fm.  Denies regular uc's, lof, vb, or uti s/s. No complaints. Reviewed gbs neg, labor s/s fkc. Plan:  Continue routine obstetrical care  F/U in 1wk for OB appointment

## 2016-06-07 NOTE — Progress Notes (Signed)
Pt denies any problems or concerns at this time.  

## 2016-06-14 ENCOUNTER — Ambulatory Visit (INDEPENDENT_AMBULATORY_CARE_PROVIDER_SITE_OTHER): Payer: 59 | Admitting: Women's Health

## 2016-06-14 ENCOUNTER — Encounter: Payer: Self-pay | Admitting: Women's Health

## 2016-06-14 VITALS — BP 108/74 | HR 72 | Wt 218.0 lb

## 2016-06-14 DIAGNOSIS — Z1389 Encounter for screening for other disorder: Secondary | ICD-10-CM

## 2016-06-14 DIAGNOSIS — Z3403 Encounter for supervision of normal first pregnancy, third trimester: Secondary | ICD-10-CM

## 2016-06-14 DIAGNOSIS — Z331 Pregnant state, incidental: Secondary | ICD-10-CM

## 2016-06-14 DIAGNOSIS — Z3A39 39 weeks gestation of pregnancy: Secondary | ICD-10-CM

## 2016-06-14 LAB — POCT URINALYSIS DIPSTICK
Blood, UA: NEGATIVE
Glucose, UA: NEGATIVE
KETONES UA: NEGATIVE
Leukocytes, UA: NEGATIVE
NITRITE UA: NEGATIVE
Protein, UA: NEGATIVE

## 2016-06-14 NOTE — Patient Instructions (Signed)
Call the office ([phone]) or go to [facility] if:  You begin to have strong, frequent contractions  Your water breaks.  Sometimes it is a big gush of fluid, sometimes it is just a trickle that keeps getting your panties wet or running down your legs  You have vaginal bleeding.  It is normal to have a small amount of spotting if your cervix was checked.   You don't feel your baby moving like normal.  If you don't, get you something to eat and drink and lay down and focus on feeling your baby move.  You should feel at least 10 movements in 2 hours.  If you don't, you should call the office or go to [facility].    Braxton Hicks Contractions Contractions of the uterus can occur throughout pregnancy. Contractions are not always a sign that you are in labor.  WHAT ARE BRAXTON HICKS CONTRACTIONS?  Contractions that occur before labor are called Braxton Hicks contractions, or false labor. Toward the end of pregnancy (32-34 weeks), these contractions can develop more often and may become more forceful. This is not true labor because these contractions do not result in opening (dilatation) and thinning of the cervix. They are sometimes difficult to tell apart from true labor because these contractions can be forceful and people have different pain tolerances. You should not feel embarrassed if you go to the hospital with false labor. Sometimes, the only way to tell if you are in true labor is for your health care provider to look for changes in the cervix. If there are no prenatal problems or other health problems associated with the pregnancy, it is completely safe to be sent home with false labor and await the onset of true labor. HOW CAN YOU TELL THE DIFFERENCE BETWEEN TRUE AND FALSE LABOR? False Labor  The contractions of false labor are usually shorter and not as hard as those of true labor.   The contractions are usually irregular.   The contractions are often felt in the front of  the lower abdomen and in the groin.   The contractions may go away when you walk around or change positions while lying down.   The contractions get weaker and are shorter lasting as time goes on.   The contractions do not usually become progressively stronger, regular, and closer together as with true labor.  True Labor  Contractions in true labor last 30-70 seconds, become very regular, usually become more intense, and increase in frequency.   The contractions do not go away with walking.   The discomfort is usually felt in the top of the uterus and spreads to the lower abdomen and low back.   True labor can be determined by your health care provider with an exam. This will show that the cervix is dilating and getting thinner.  WHAT TO REMEMBER  Keep up with your usual exercises and follow other instructions given by your health care provider.   Take medicines as directed by your health care provider.   Keep your regular prenatal appointments.   Eat and drink lightly if you think you are going into labor.   If Braxton Hicks contractions are making you uncomfortable:   Change your position from lying down or resting to walking, or from walking to resting.   Sit and rest in a tub of warm water.   Drink 2-3 glasses of water. Dehydration may cause these contractions.   Do slow and deep breathing several times an hour.    WHEN SHOULD I SEEK IMMEDIATE MEDICAL CARE? Seek immediate medical care if:  Your contractions become stronger, more regular, and closer together.   You have fluid leaking or gushing from your vagina.   You have a fever.   You pass blood-tinged mucus.   You have vaginal bleeding.   You have continuous abdominal pain.   You have low back pain that you never had before.   You feel your baby's head pushing down and causing pelvic pressure.   Your baby is not moving as much as it used to.    This information is not intended to  replace advice given to you by your health care provider. Make sure you discuss any questions you have with your health care provider.   Document Released: 11/22/2005 Document Revised: 11/27/2013 Document Reviewed: 09/03/2013 Elsevier Interactive Patient Education 2016 Elsevier Inc.  

## 2016-06-14 NOTE — Progress Notes (Addendum)
Low-risk OB appointment G1P0 8123w3d Estimated Date of Delivery: 06/25/16 BP 108/74 mmHg  Pulse 72  Wt 218 lb (98.884 kg)  LMP 09/19/2015  BP, weight, and urine reviewed.  Refer to obstetrical flow sheet for FH & FHR.  Reports good fm.  Denies regular uc's, lof, vb, or uti s/s. Swelling in legs, R>L, no erythema/streaking/pain 2+Lt leg edema, Rt 1+, no erythema/streaking/neg Homan's Declined SVE Reviewed labor s/s, fkc. Elevate legs to help w/ swelling.  Plan:  Continue routine obstetrical care  F/U in 1wk for OB appointment

## 2016-06-21 ENCOUNTER — Ambulatory Visit (INDEPENDENT_AMBULATORY_CARE_PROVIDER_SITE_OTHER): Payer: 59 | Admitting: Women's Health

## 2016-06-21 ENCOUNTER — Encounter: Payer: Self-pay | Admitting: Women's Health

## 2016-06-21 VITALS — BP 102/62 | HR 80 | Wt 221.5 lb

## 2016-06-21 DIAGNOSIS — Z3403 Encounter for supervision of normal first pregnancy, third trimester: Secondary | ICD-10-CM | POA: Diagnosis not present

## 2016-06-21 DIAGNOSIS — Z3A40 40 weeks gestation of pregnancy: Secondary | ICD-10-CM

## 2016-06-21 DIAGNOSIS — Z1389 Encounter for screening for other disorder: Secondary | ICD-10-CM

## 2016-06-21 DIAGNOSIS — Z331 Pregnant state, incidental: Secondary | ICD-10-CM

## 2016-06-21 LAB — POCT URINALYSIS DIPSTICK
Blood, UA: NEGATIVE
GLUCOSE UA: NEGATIVE
Ketones, UA: NEGATIVE
LEUKOCYTES UA: NEGATIVE
NITRITE UA: NEGATIVE
PROTEIN UA: NEGATIVE

## 2016-06-21 NOTE — Progress Notes (Signed)
Low-risk OB appointment G1P0 2874w3d Estimated Date of Delivery: 06/25/16 BP 102/62 mmHg  Pulse 80  Wt 221 lb 8 oz (100.472 kg)  LMP 09/19/2015  BP, weight, and urine reviewed.  Refer to obstetrical flow sheet for FH & FHR.  Reports good fm.  Denies regular uc's, lof, vb, or uti s/s. Some LBP- reviewed relief measures. FOB wants baby born on 7/24, discussed as of now no medical reason to induce.  Declined SVE. Unable to tell presentation by leopold's, vtx confirmed by informal u/s Reviewed labor s/s, fkc. Plan:  Continue routine obstetrical care  F/U in 1wk for OB appointment

## 2016-06-21 NOTE — Patient Instructions (Signed)
Call the office ([phone]) or go to [facility] if:  You begin to have strong, frequent contractions  Your water breaks.  Sometimes it is a big gush of fluid, sometimes it is just a trickle that keeps getting your panties wet or running down your legs  You have vaginal bleeding.  It is normal to have a small amount of spotting if your cervix was checked.   You don't feel your baby moving like normal.  If you don't, get you something to eat and drink and lay down and focus on feeling your baby move.  You should feel at least 10 movements in 2 hours.  If you don't, you should call the office or go to [facility].    Braxton Hicks Contractions Contractions of the uterus can occur throughout pregnancy. Contractions are not always a sign that you are in labor.  WHAT ARE BRAXTON HICKS CONTRACTIONS?  Contractions that occur before labor are called Braxton Hicks contractions, or false labor. Toward the end of pregnancy (32-34 weeks), these contractions can develop more often and may become more forceful. This is not true labor because these contractions do not result in opening (dilatation) and thinning of the cervix. They are sometimes difficult to tell apart from true labor because these contractions can be forceful and people have different pain tolerances. You should not feel embarrassed if you go to the hospital with false labor. Sometimes, the only way to tell if you are in true labor is for your health care provider to look for changes in the cervix. If there are no prenatal problems or other health problems associated with the pregnancy, it is completely safe to be sent home with false labor and await the onset of true labor. HOW CAN YOU TELL THE DIFFERENCE BETWEEN TRUE AND FALSE LABOR? False Labor  The contractions of false labor are usually shorter and not as hard as those of true labor.   The contractions are usually irregular.   The contractions are often felt in the front of  the lower abdomen and in the groin.   The contractions may go away when you walk around or change positions while lying down.   The contractions get weaker and are shorter lasting as time goes on.   The contractions do not usually become progressively stronger, regular, and closer together as with true labor.  True Labor  Contractions in true labor last 30-70 seconds, become very regular, usually become more intense, and increase in frequency.   The contractions do not go away with walking.   The discomfort is usually felt in the top of the uterus and spreads to the lower abdomen and low back.   True labor can be determined by your health care provider with an exam. This will show that the cervix is dilating and getting thinner.  WHAT TO REMEMBER  Keep up with your usual exercises and follow other instructions given by your health care provider.   Take medicines as directed by your health care provider.   Keep your regular prenatal appointments.   Eat and drink lightly if you think you are going into labor.   If Braxton Hicks contractions are making you uncomfortable:   Change your position from lying down or resting to walking, or from walking to resting.   Sit and rest in a tub of warm water.   Drink 2-3 glasses of water. Dehydration may cause these contractions.   Do slow and deep breathing several times an hour.    WHEN SHOULD I SEEK IMMEDIATE MEDICAL CARE? Seek immediate medical care if:  Your contractions become stronger, more regular, and closer together.   You have fluid leaking or gushing from your vagina.   You have a fever.   You pass blood-tinged mucus.   You have vaginal bleeding.   You have continuous abdominal pain.   You have low back pain that you never had before.   You feel your baby's head pushing down and causing pelvic pressure.   Your baby is not moving as much as it used to.    This information is not intended to  replace advice given to you by your health care provider. Make sure you discuss any questions you have with your health care provider.   Document Released: 11/22/2005 Document Revised: 11/27/2013 Document Reviewed: 09/03/2013 Elsevier Interactive Patient Education 2016 Elsevier Inc.  

## 2016-06-27 ENCOUNTER — Inpatient Hospital Stay (HOSPITAL_COMMUNITY)
Admission: AD | Admit: 2016-06-27 | Discharge: 2016-06-30 | DRG: 775 | Disposition: A | Discharge status: Home / Self Care | Payer: Medicaid Other | Source: Ambulatory Visit | Attending: Obstetrics and Gynecology | Admitting: Obstetrics and Gynecology

## 2016-06-27 ENCOUNTER — Inpatient Hospital Stay (HOSPITAL_COMMUNITY): Payer: Medicaid Other | Admitting: Anesthesiology

## 2016-06-27 ENCOUNTER — Encounter (HOSPITAL_COMMUNITY): Payer: Self-pay

## 2016-06-27 DIAGNOSIS — Z3A40 40 weeks gestation of pregnancy: Secondary | ICD-10-CM | POA: Diagnosis not present

## 2016-06-27 DIAGNOSIS — F419 Anxiety disorder, unspecified: Secondary | ICD-10-CM | POA: Diagnosis present

## 2016-06-27 DIAGNOSIS — O99324 Drug use complicating childbirth: Secondary | ICD-10-CM | POA: Diagnosis present

## 2016-06-27 DIAGNOSIS — O9962 Diseases of the digestive system complicating childbirth: Secondary | ICD-10-CM | POA: Diagnosis present

## 2016-06-27 DIAGNOSIS — M419 Scoliosis, unspecified: Secondary | ICD-10-CM | POA: Diagnosis present

## 2016-06-27 DIAGNOSIS — O411230 Chorioamnionitis, third trimester, not applicable or unspecified: Secondary | ICD-10-CM | POA: Diagnosis present

## 2016-06-27 DIAGNOSIS — Z87891 Personal history of nicotine dependence: Secondary | ICD-10-CM

## 2016-06-27 DIAGNOSIS — F1290 Cannabis use, unspecified, uncomplicated: Secondary | ICD-10-CM | POA: Diagnosis present

## 2016-06-27 DIAGNOSIS — J45909 Unspecified asthma, uncomplicated: Secondary | ICD-10-CM | POA: Diagnosis present

## 2016-06-27 DIAGNOSIS — K219 Gastro-esophageal reflux disease without esophagitis: Secondary | ICD-10-CM | POA: Diagnosis present

## 2016-06-27 DIAGNOSIS — Z811 Family history of alcohol abuse and dependence: Secondary | ICD-10-CM

## 2016-06-27 DIAGNOSIS — O99344 Other mental disorders complicating childbirth: Secondary | ICD-10-CM | POA: Diagnosis present

## 2016-06-27 DIAGNOSIS — O9952 Diseases of the respiratory system complicating childbirth: Secondary | ICD-10-CM | POA: Diagnosis present

## 2016-06-27 DIAGNOSIS — Z3403 Encounter for supervision of normal first pregnancy, third trimester: Secondary | ICD-10-CM

## 2016-06-27 DIAGNOSIS — O9989 Other specified diseases and conditions complicating pregnancy, childbirth and the puerperium: Secondary | ICD-10-CM | POA: Diagnosis present

## 2016-06-27 DIAGNOSIS — O4290 Premature rupture of membranes, unspecified as to length of time between rupture and onset of labor, unspecified weeks of gestation: Secondary | ICD-10-CM | POA: Diagnosis present

## 2016-06-27 DIAGNOSIS — Z833 Family history of diabetes mellitus: Secondary | ICD-10-CM

## 2016-06-27 DIAGNOSIS — O4202 Full-term premature rupture of membranes, onset of labor within 24 hours of rupture: Secondary | ICD-10-CM | POA: Diagnosis present

## 2016-06-27 DIAGNOSIS — Z8249 Family history of ischemic heart disease and other diseases of the circulatory system: Secondary | ICD-10-CM | POA: Diagnosis not present

## 2016-06-27 DIAGNOSIS — Z818 Family history of other mental and behavioral disorders: Secondary | ICD-10-CM | POA: Diagnosis not present

## 2016-06-27 LAB — POCT FERN TEST: POCT Fern Test: POSITIVE

## 2016-06-27 LAB — CBC
HEMATOCRIT: 36.8 % (ref 36.0–46.0)
HEMOGLOBIN: 12.5 g/dL (ref 12.0–15.0)
MCH: 31.7 pg (ref 26.0–34.0)
MCHC: 34 g/dL (ref 30.0–36.0)
MCV: 93.4 fL (ref 78.0–100.0)
Platelets: 299 10*3/uL (ref 150–400)
RBC: 3.94 MIL/uL (ref 3.87–5.11)
RDW: 14 % (ref 11.5–15.5)
WBC: 10.1 10*3/uL (ref 4.0–10.5)

## 2016-06-27 LAB — TYPE AND SCREEN
ABO/RH(D): A POS
Antibody Screen: NEGATIVE

## 2016-06-27 LAB — ABO/RH: ABO/RH(D): A POS

## 2016-06-27 MED ORDER — FENTANYL CITRATE (PF) 100 MCG/2ML IJ SOLN
100.0000 ug | INTRAMUSCULAR | Status: DC | PRN
  Administered 2016-06-27 (×3): 100 ug via INTRAVENOUS
  Filled 2016-06-27 (×3): qty 2

## 2016-06-27 MED ORDER — LACTATED RINGERS IV SOLN: 500.0000 mL | INTRAVENOUS | Status: DC | PRN

## 2016-06-27 MED ORDER — OXYTOCIN 40 UNITS IN LACTATED RINGERS INFUSION - SIMPLE MED: 2.5000 [IU]/h | INTRAVENOUS | Status: DC

## 2016-06-27 MED ORDER — LACTATED RINGERS IV SOLN
INTRAVENOUS | Status: DC
  Administered 2016-06-27 – 2016-06-28 (×3): via INTRAVENOUS

## 2016-06-27 MED ORDER — EPHEDRINE 5 MG/ML INJ
10.0000 mg | INTRAVENOUS | Status: DC | PRN
  Filled 2016-06-27: qty 4

## 2016-06-27 MED ORDER — PHENYLEPHRINE 40 MCG/ML (10ML) SYRINGE FOR IV PUSH (FOR BLOOD PRESSURE SUPPORT)
80.0000 ug | PREFILLED_SYRINGE | INTRAVENOUS | Status: DC | PRN
  Administered 2016-06-27: 80 ug via INTRAVENOUS
  Filled 2016-06-27: qty 5

## 2016-06-27 MED ORDER — TERBUTALINE SULFATE 1 MG/ML IJ SOLN
0.2500 mg | Freq: Once | INTRAMUSCULAR | Status: DC | PRN
  Filled 2016-06-27: qty 1

## 2016-06-27 MED ORDER — LIDOCAINE HCL (PF) 1 % IJ SOLN
30.0000 mL | INTRAMUSCULAR | Status: AC | PRN
  Administered 2016-06-28: 30 mL via SUBCUTANEOUS
  Filled 2016-06-27: qty 30

## 2016-06-27 MED ORDER — LIDOCAINE HCL (PF) 1 % IJ SOLN
INTRAMUSCULAR | Status: DC | PRN
  Administered 2016-06-27: 6 mL via EPIDURAL
  Administered 2016-06-27: 4 mL

## 2016-06-27 MED ORDER — SOD CITRATE-CITRIC ACID 500-334 MG/5ML PO SOLN
30.0000 mL | ORAL | Status: DC | PRN
  Administered 2016-06-27: 30 mL via ORAL
  Filled 2016-06-27: qty 15

## 2016-06-27 MED ORDER — FENTANYL 2.5 MCG/ML BUPIVACAINE 1/10 % EPIDURAL INFUSION (WH - ANES)
14.0000 mL/h | INTRAMUSCULAR | Status: DC | PRN
  Administered 2016-06-27 – 2016-06-28 (×4): 14 mL/h via EPIDURAL
  Filled 2016-06-27 (×4): qty 125

## 2016-06-27 MED ORDER — OXYTOCIN BOLUS FROM INFUSION: 500.0000 mL | Freq: Once | INTRAVENOUS | Status: DC

## 2016-06-27 MED ORDER — NALBUPHINE HCL 10 MG/ML IJ SOLN
10.0000 mg | INTRAMUSCULAR | Status: DC | PRN
  Administered 2016-06-27 (×2): 10 mg via INTRAVENOUS
  Filled 2016-06-27 (×2): qty 1

## 2016-06-27 MED ORDER — PHENYLEPHRINE 40 MCG/ML (10ML) SYRINGE FOR IV PUSH (FOR BLOOD PRESSURE SUPPORT)
80.0000 ug | PREFILLED_SYRINGE | INTRAVENOUS | Status: DC | PRN
  Filled 2016-06-27 (×2): qty 10
  Filled 2016-06-27: qty 5

## 2016-06-27 MED ORDER — OXYTOCIN 40 UNITS IN LACTATED RINGERS INFUSION - SIMPLE MED
1.0000 m[IU]/min | INTRAVENOUS | Status: DC
  Administered 2016-06-27: 2 m[IU]/min via INTRAVENOUS
  Filled 2016-06-27: qty 1000

## 2016-06-27 MED ORDER — PROMETHAZINE HCL 25 MG/ML IJ SOLN
12.5000 mg | Freq: Four times a day (QID) | INTRAMUSCULAR | Status: DC | PRN
  Administered 2016-06-27: 12.5 mg via INTRAVENOUS
  Filled 2016-06-27: qty 1

## 2016-06-27 MED ORDER — DIPHENHYDRAMINE HCL 50 MG/ML IJ SOLN
12.5000 mg | INTRAMUSCULAR | Status: DC | PRN
  Administered 2016-06-27: 12.5 mg via INTRAVENOUS
  Filled 2016-06-27: qty 1

## 2016-06-27 MED ORDER — LACTATED RINGERS IV SOLN
500.0000 mL | Freq: Once | INTRAVENOUS | Status: AC
  Administered 2016-06-27: 500 mL via INTRAVENOUS

## 2016-06-27 NOTE — Progress Notes (Signed)
Dr Jean Rosenthal notified again about patient continuing to have vaginal pain and pressure. Patient sitting in semi/high fowlers for 1 hour. Patient rechecked with same exam from earlier. Requested a redose but told me her level was good and there was nothing else from an anesthesia standpoint that he could do. Patient was made aware. Will call CNM to assess plan.

## 2016-06-27 NOTE — Anesthesia Procedure Notes (Signed)
Epidural Patient location during procedure: OB  Preanesthetic Checklist Completed: patient identified, site marked, surgical consent, pre-op evaluation, timeout performed, IV checked, risks and benefits discussed and monitors and equipment checked  Epidural Patient position: sitting Prep: site prepped and draped and DuraPrep Patient monitoring: continuous pulse ox and blood pressure Approach: midline Location: L2-L3 Injection technique: LOR air  Needle:  Needle type: Tuohy  Needle gauge: 17 G Needle length: 9 cm and 9 Needle insertion depth: 8 cm Catheter type: closed end flexible Catheter size: 19 Gauge Catheter at skin depth: 15 cm Test dose: negative  Assessment Events: blood not aspirated, injection not painful, no injection resistance, negative IV test and no paresthesia  Additional Notes Scoliosis and obesity made this a difficult block. Attempted first at L3-4  Dosing of Epidural:  1st dose, through catheter ............................................Marland Kitchen  Xylocaine 40 mg  2nd dose, through catheter, after waiting 3 minutes........Marland KitchenXylocaine 60 mg    As each dose occurred, patient was free of IV sx; and patient exhibited no evidence of SA injection.  Patient is more comfortable after epidural dosed. Please see RN's note for documentation of vital signs,and FHR which are stable.  Patient reminded not to try to ambulate with numb legs, and that an RN must be present when she attempts to get up.

## 2016-06-27 NOTE — Anesthesia Pain Management Evaluation Note (Signed)
  CRNA Pain Management Visit Note  Patient: Olivia Fuller, 30 y.o., female  "Hello I am a member of the anesthesia team at [facility]. We have an anesthesia team available at all times to provide care throughout the hospital, including epidural management and anesthesia for C-section. I don't know your plan for the delivery whether it a natural birth, water birth, IV sedation, nitrous supplementation, doula or epidural, but we want to meet your pain goals."   1.Was your pain managed to your expectations on prior hospitalizations?   Yes   2.What is your expectation for pain management during this hospitalization?     Nitrous Oxide  3.How can we help you reach that goal? unsure  Record the patient's initial score and the patient's pain goal.   Pain: 4  Pain Goal: 7 [facility] wants you to be able to say your pain was always managed very well.  Cephus Shelling 06/27/2016

## 2016-06-27 NOTE — Progress Notes (Signed)
Notified of pt arrival in MAU and exam with positive fern. Will admit to labor and delivery 

## 2016-06-27 NOTE — Progress Notes (Signed)
Olivia Fuller is a 30 y.o. G1P0 at [redacted]w[redacted]d by ultrasound admitted for rupture of membranes  Subjective:   Objective: BP 120/88   Pulse (!) 110   Temp 97.9 F (36.6 C) (Oral)   Resp 18   Ht 5\' 7"  (1.702 m)   Wt 221 lb (100.2 kg)   LMP 09/19/2015   BMI 34.61 kg/m  No intake/output data recorded. No intake/output data recorded.  FHT:  FHR: 145 bpm, variability: moderate,  accelerations:  Present,  decelerations:  Absent UC:   regular, every 2 minutes SVE:   Dilation: Fingertip Effacement (%): Thick Station: -2 Exam by:: Olivia Fuller RNC  Labs: Lab Results  Component Value Date   WBC 10.1 06/27/2016   HGB 12.5 06/27/2016   HCT 36.8 06/27/2016   MCV 93.4 06/27/2016   PLT 299 06/27/2016    Assessment / Plan: SROM. SVE FT/50/-2/posterior.  Cook catheter foley bulb inserted manually with stylet.  Advanced through cervix without difficulty.  Balloon inserted with 60 mL sterile water.  Catheter confirmed in cervix.  Traction applied via taping to leg.  Pt tolerated procedure well.   Labor: Progressing normally Preeclampsia:  on magnesium sulfate Fetal Wellbeing:  Category I Pain Control:  Labor support without medications I/D:  n/a Anticipated MOD:  NSVD  Olivia Fuller 06/27/2016, 9:20 AM

## 2016-06-27 NOTE — Progress Notes (Signed)
TOSCHA MOOSBRUGGER is a 30 y.o. G1P0 at [redacted]w[redacted]d by ultrasound admitted for rupture of membranes  Subjective:   Objective: BP 129/80   Pulse (!) 142   Temp 98.5 F (36.9 C) (Oral)   Resp 18   Ht 5\' 7"  (1.702 m)   Wt 221 lb (100.2 kg)   LMP 09/19/2015   BMI 34.61 kg/m  No intake/output data recorded. No intake/output data recorded.  FHT:  FHR: 160 bpm, variability: moderate,  accelerations:  Present,  decelerations:  Absent UC:   irregular, every 2-5 minutes SVE:   Dilation: 5 Effacement (%): 60 Station: -2, -1 Exam by:: A. Medford, [facility]  Labs: Lab Results  Component Value Date   WBC 10.1 06/27/2016   HGB 12.5 06/27/2016   HCT 36.8 06/27/2016   MCV 93.4 06/27/2016   PLT 299 06/27/2016    Assessment / Plan: Induction of labor due to PROM,  progressing well on pitocin  Labor: slow progress Preeclampsia:  no signs or symptoms of toxicity and intake and ouput balanced Fetal Wellbeing:  Category I Pain Control:  Epidural I/D:  n/a Anticipated MOD:  NSVD  [name] 06/27/2016, 7:30 PM

## 2016-06-27 NOTE — Progress Notes (Signed)
Olivia Fuller is a 30 y.o. G1P0 at [redacted]w[redacted]d by ultrasound admitted for rupture of membranes  Subjective: Increasing discomfort, using epidural PCA regularly with little to no relief.   Objective: BP 125/68   Pulse (!) 137   Temp 98.4 F (36.9 C) (Oral)   Resp 20   Ht 5\' 7"  (1.702 m)   Wt 221 lb (100.2 kg)   LMP 09/19/2015   SpO2 99%   BMI 34.61 kg/m  No intake/output data recorded. No intake/output data recorded.  FHT:  FHR: 160 bpm, variability: moderate,  accelerations:  Present,  decelerations:  Present occasional early decels UC:   regular, every 2-5 minutes SVE:   Dilation: 5 Effacement (%): 80 Station: -1 Exam by:: L. Cresenzo RNC ~2 hours ago  Labs: Lab Results  Component Value Date   WBC 10.1 06/27/2016   HGB 12.5 06/27/2016   HCT 36.8 06/27/2016   MCV 93.4 06/27/2016   PLT 299 06/27/2016    Assessment / Plan: Augmentation of labor, progressing well, needs further pain management  Labor: Progressing normally Preeclampsia:  n/a Fetal Wellbeing:  Category I Pain Control:  Epidural I/D:  n/a Anticipated MOD:  NSVD  Olivia Fuller 06/27/2016, 11:51 PM

## 2016-06-27 NOTE — MAU Note (Signed)
Pt presents complaining of SROM at 0200. Reports large amount of clear fluid. Reports good fetal movement. Denies bleeding.

## 2016-06-27 NOTE — Anesthesia Preprocedure Evaluation (Signed)
Anesthesia Evaluation  Patient identified by MRN, date of birth, ID band Patient awake    Reviewed: Allergy & Precautions, H&P , Patient's Chart, lab work & pertinent test results  Airway Mallampati: II TM Distance: >3 FB Neck ROM: full    Dental  (+) Teeth Intact   Pulmonary asthma , former smoker,  breath sounds clear to auscultation        Cardiovascular Rhythm:regular Rate:Normal     Neuro/Psych    GI/Hepatic   Endo/Other    Renal/GU      Musculoskeletal   Abdominal   Peds  Hematology   Anesthesia Other Findings       Reproductive/Obstetrics (+) Pregnancy                           Anesthesia Physical Anesthesia Plan  ASA: II  Anesthesia Plan: Epidural   Post-op Pain Management:    Induction:   Airway Management Planned:   Additional Equipment:   Intra-op Plan:   Post-operative Plan:   Informed Consent: I have reviewed the patients History and Physical, chart, labs and discussed the procedure including the risks, benefits and alternatives for the proposed anesthesia with the patient or authorized representative who has indicated his/her understanding and acceptance.   Dental Advisory Given  Plan Discussed with:   Anesthesia Plan Comments: (Labs checked- platelets confirmed with RN in room. Fetal heart tracing, per RN, reported to be stable enough for sitting procedure. Discussed epidural, and patient consents to the procedure:  included risk of possible headache,backache, failed block, allergic reaction, and nerve injury. This patient was asked if she had any questions or concerns before the procedure started.)        Anesthesia Quick Evaluation  

## 2016-06-27 NOTE — Progress Notes (Signed)
Olivia Fuller is a 30 y.o. G1P0 at [redacted]w[redacted]d by ultrasound admitted for rupture of membranes  Subjective:   Objective: BP 130/82   Pulse (!) 110   Temp 98.5 F (36.9 C) (Oral)   Resp 18   Ht 5\' 7"  (1.702 m)   Wt 221 lb (100.2 kg)   LMP 09/19/2015   BMI 34.61 kg/m  No intake/output data recorded. No intake/output data recorded.  FHT:  FHR: 130 bpm, variability: moderate,  accelerations:  Present,  decelerations:  Absent UC:   regular, every 2-4 minutes SVE:   Dilation: 1 Effacement (%): Thick Station: -2 Exam by:: A. Descanso, [facility]   Labs: Lab Results  Component Value Date   WBC 10.1 06/27/2016   HGB 12.5 06/27/2016   HCT 36.8 06/27/2016   MCV 93.4 06/27/2016   PLT 299 06/27/2016    Assessment / Plan: Augmentation of labor, progressing well  Labor: yet to be in labor Preeclampsia:  no signs or symptoms of toxicity and intake and ouput balanced Fetal Wellbeing:  Category I Pain Control:  IV pain meds I/D:  n/a Anticipated MOD:  NSVD  [name] 06/27/2016, 1:15 PM
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
## 2016-06-27 NOTE — Progress Notes (Signed)
Dr. Jean Rosenthal notified at 2100 that pt was uncomfortable with epidural. Dr. Jean Rosenthal found epidural level to be normal with assessment. Continue to press EPCA as needed.

## 2016-06-28 ENCOUNTER — Encounter (HOSPITAL_COMMUNITY): Payer: Self-pay | Admitting: *Deleted

## 2016-06-28 ENCOUNTER — Encounter: Payer: 59 | Admitting: Women's Health

## 2016-06-28 DIAGNOSIS — Z3A40 40 weeks gestation of pregnancy: Secondary | ICD-10-CM

## 2016-06-28 LAB — RPR: RPR Ser Ql: NONREACTIVE

## 2016-06-28 MED ORDER — DIBUCAINE 1 % RE OINT: 1.0000 "application " | TOPICAL_OINTMENT | RECTAL | Status: DC | PRN

## 2016-06-28 MED ORDER — SODIUM CHLORIDE 0.9 % IV SOLN
1.0000 g | INTRAVENOUS | Status: DC
  Administered 2016-06-28: 1 g via INTRAVENOUS
  Filled 2016-06-28 (×2): qty 1000

## 2016-06-28 MED ORDER — IBUPROFEN 600 MG PO TABS
600.0000 mg | ORAL_TABLET | Freq: Four times a day (QID) | ORAL | Status: DC
  Administered 2016-06-28 – 2016-06-30 (×9): 600 mg via ORAL
  Filled 2016-06-28 (×9): qty 1

## 2016-06-28 MED ORDER — WITCH HAZEL-GLYCERIN EX PADS: 1.0000 "application " | MEDICATED_PAD | CUTANEOUS | Status: DC | PRN

## 2016-06-28 MED ORDER — ONDANSETRON HCL 4 MG PO TABS: 4.0000 mg | ORAL_TABLET | ORAL | Status: DC | PRN

## 2016-06-28 MED ORDER — SODIUM BICARBONATE 8.4 % IV SOLN
INTRAVENOUS | Status: DC | PRN
  Administered 2016-06-28: 3 mL via EPIDURAL

## 2016-06-28 MED ORDER — DIPHENHYDRAMINE HCL 25 MG PO CAPS: 25.0000 mg | ORAL_CAPSULE | Freq: Four times a day (QID) | ORAL | Status: DC | PRN

## 2016-06-28 MED ORDER — BENZOCAINE-MENTHOL 20-0.5 % EX AERO
1.0000 "application " | INHALATION_SPRAY | CUTANEOUS | Status: DC | PRN
  Administered 2016-06-28: 1 via TOPICAL
  Filled 2016-06-28: qty 56

## 2016-06-28 MED ORDER — ACETAMINOPHEN 325 MG PO TABS
650.0000 mg | ORAL_TABLET | ORAL | Status: DC | PRN
Start: 2016-06-28 — End: 2016-06-30

## 2016-06-28 MED ORDER — ERYTHROMYCIN 5 MG/GM OP OINT
TOPICAL_OINTMENT | OPHTHALMIC | Status: AC
  Filled 2016-06-28: qty 1

## 2016-06-28 MED ORDER — GENTAMICIN SULFATE 40 MG/ML IJ SOLN
200.0000 mg | Freq: Three times a day (TID) | INTRAVENOUS | Status: DC
  Administered 2016-06-28: 200 mg via INTRAVENOUS
  Filled 2016-06-28 (×2): qty 5

## 2016-06-28 MED ORDER — ONDANSETRON HCL 4 MG/2ML IJ SOLN
4.0000 mg | INTRAMUSCULAR | Status: DC | PRN
Start: 2016-06-28 — End: 2016-06-30

## 2016-06-28 MED ORDER — SIMETHICONE 80 MG PO CHEW: 80.0000 mg | CHEWABLE_TABLET | ORAL | Status: DC | PRN

## 2016-06-28 MED ORDER — SODIUM CHLORIDE 0.9 % IV SOLN
2.0000 g | Freq: Once | INTRAVENOUS | Status: AC
  Administered 2016-06-28: 2 g via INTRAVENOUS
  Filled 2016-06-28: qty 2000

## 2016-06-28 MED ORDER — ACETAMINOPHEN 500 MG PO TABS
1000.0000 mg | ORAL_TABLET | Freq: Four times a day (QID) | ORAL | Status: DC | PRN
  Administered 2016-06-28: 1000 mg via ORAL
  Filled 2016-06-28: qty 2

## 2016-06-28 MED ORDER — SENNOSIDES-DOCUSATE SODIUM 8.6-50 MG PO TABS
2.0000 | ORAL_TABLET | ORAL | Status: DC
  Administered 2016-06-28: 2 via ORAL
  Filled 2016-06-28 (×2): qty 2

## 2016-06-28 MED ORDER — PRENATAL MULTIVITAMIN CH
1.0000 | ORAL_TABLET | Freq: Every day | ORAL | Status: DC
  Administered 2016-06-28 – 2016-06-30 (×3): 1 via ORAL
  Filled 2016-06-28 (×3): qty 1

## 2016-06-28 MED ORDER — PROMETHAZINE HCL 25 MG/ML IJ SOLN
12.5000 mg | INTRAMUSCULAR | Status: DC | PRN
Start: 2016-06-28 — End: 2016-06-28

## 2016-06-28 MED ORDER — NALBUPHINE HCL 10 MG/ML IJ SOLN: 10.0000 mg | INTRAMUSCULAR | Status: DC | PRN

## 2016-06-28 MED ORDER — BUPIVACAINE HCL (PF) 0.25 % IJ SOLN
INTRAMUSCULAR | Status: DC | PRN
  Administered 2016-06-28: 3 mL via EPIDURAL

## 2016-06-28 MED ORDER — ZOLPIDEM TARTRATE 5 MG PO TABS: 5.0000 mg | ORAL_TABLET | Freq: Every evening | ORAL | Status: DC | PRN

## 2016-06-28 MED ORDER — COCONUT OIL OIL: 1.0000 "application " | TOPICAL_OIL | Status: DC | PRN

## 2016-06-28 MED ORDER — TETANUS-DIPHTH-ACELL PERTUSSIS 5-2.5-18.5 LF-MCG/0.5 IM SUSP: 0.5000 mL | Freq: Once | INTRAMUSCULAR | Status: DC

## 2016-06-28 NOTE — Progress Notes (Signed)
Dr. Jean Rosenthal at bedside for redose of epidural. Increased rate with bolus.

## 2016-06-28 NOTE — Consult Note (Signed)
Code Apgar paged for shoulder dystocia.  Delivery team excused by Dr. Emelda Fear after infant was born crying vigorously.    Overton Mam, MD (Attending Neonatologist)

## 2016-06-28 NOTE — Progress Notes (Signed)
Patient ID: Olivia Fuller, female   DOB: 1986/03/14, 30 y.o.   MRN: [id number] Operative Delivery Note At 6:58 AM a viable and healthy female was delivered via Vaginal, Vacuum (Extractor).  Presentation: vertex; Position: Left,, Occiput,, Anterior; Station: +3.  Verbal consent: obtained from patient.  Risks and benefits discussed in detail.  Risks include, but are not limited to the risks of anesthesia, bleeding, infection, damage to maternal tissues, fetal cephalhematoma.  There is also the risk of inability to effect vaginal delivery of the head, or shoulder dystocia that cannot be resolved by established maneuvers, leading to the need for emergency cesarean section.  APGAR: 5, 7; weight  .   Placenta status: , .   Cord:  with the following complications: .  Cord pH: nnot done NUCHAL CORD X 1 NOT RELEASEABLE PRIOR TO DEL Anesthesia:EPIDURAL   Instruments: Kiwi Episiotomy: None Lacerations: 2nd degree;Perineal;3rd degree   partial 3rd Suture Repair: 2.0 vicryl vicryl rapide Est. Blood Loss (mL):  350  Mom to postpartum.  Baby to Couplet care / Skin to Skin.after support by resuscitation team  Joslin Doell V 06/28/2016, 7:36 AM

## 2016-06-28 NOTE — Anesthesia Postprocedure Evaluation (Signed)
Anesthesia Post Note  Patient: Olivia Fuller  Procedure(s) Performed: * No procedures listed *  Patient location during evaluation: Mother Baby Anesthesia Type: Epidural Level of consciousness: awake, awake and alert, oriented and patient cooperative Pain management: pain level controlled Vital Signs Assessment: post-procedure vital signs reviewed and stable Respiratory status: spontaneous breathing, nonlabored ventilation and respiratory function stable Cardiovascular status: stable Postop Assessment: no headache, no backache, patient able to bend at knees and no signs of nausea or vomiting Anesthetic complications: no     Last Vitals:  Vitals:   06/28/16 0930 06/28/16 1004  BP: 124/70 139/62  Pulse: 99 (!) 114  Resp: 18 20  Temp: 37.2 C 36.7 C    Last Pain:  Vitals:   06/28/16 1241  TempSrc:   PainSc: 2    Pain Goal:                 Chue Berkovich L

## 2016-06-28 NOTE — Progress Notes (Signed)
ANTIBIOTIC CONSULT NOTE - INITIAL  Pharmacy Consult for Gentamicin Indication: Maternal Fever  Allergies  Allergen Reactions  . Tylenol [Acetaminophen] Itching and Nausea And Vomiting  . Zofran [Ondansetron Hcl] Hives and Itching    Patient Measurements: Height: 5\' 7"  (170.2 cm) Weight: 221 lb (100.2 kg) IBW/kg (Calculated) : 61.6 Adjusted Body Weight: 73.2 kg  Vital Signs: Temp: 101.3 F (38.5 C) (07/24 0230) Temp Source: Oral (07/24 0130) BP: 126/75 (07/24 0230) Pulse Rate: 139 (07/24 0234)  Labs:  Recent Labs  06/27/16 0410  WBC 10.1  HGB 12.5  PLT 299   No results for input(s): GENTTROUGH, GENTPEAK, GENTRANDOM in the last 72 hours.   Microbiology: Recent Results (from the past 720 hour(s))  OB RESULT CONSOLE Group B Strep     Status: None   Collection Time: 06/01/16 12:00 AM  Result Value Ref Range Status   GBS Negative  Final  Strep Gp B NAA     Status: None   Collection Time: 06/01/16  2:00 PM  Result Value Ref Range Status   Strep Gp B NAA Negative Negative Final    Comment: Centers for Disease Control and Prevention (CDC) and American Congress of Obstetricians and Gynecologists (ACOG) guidelines for prevention of perinatal group B streptococcal (GBS) disease specify co-collection of a vaginal and rectal swab specimen to maximize sensitivity of GBS detection. Per the CDC and ACOG, swabbing both the lower vagina and rectum substantially increases the yield of detection compared with sampling the vagina alone. Penicillin G, ampicillin, or cefazolin are indicated for intrapartum prophylaxis of perinatal GBS colonization. Reflex susceptibility testing should be performed prior to use of clindamycin only on GBS isolates from penicillin-allergic women who are considered a high risk for anaphylaxis. Treatment with vancomycin without additional testing is warranted if resistance to clindamycin is noted.   GC/Chlamydia Probe Amp     Status: None   Collection  Time: 06/01/16  2:00 PM  Result Value Ref Range Status   Chlamydia trachomatis, NAA Negative Negative Final   Neisseria gonorrhoeae by PCR Negative Negative Final    Medications:  Ampicillin 2 grams IV followed by 1 gram Q4 hrs  Assessment: 29 y.o. female G1P0 at [redacted]w[redacted]d admitted for ROM. Now with maternal fever.  Estimated Ke = 0.341, Vd = 0.39 L/kg  Goal of Therapy:  Gentamicin peak 6-8 mg/L and Trough < 1 mg/L  Plan:   Gentamicin 200 mg IV every 8 hrs  Check Scr with next labs if gentamicin continued. Will check gentamicin levels if continued > 72hr or clinically indicated.  Loyola Mast 06/28/2016,3:12 AM

## 2016-06-28 NOTE — Lactation Note (Signed)
This note was copied from a baby's chart. Lactation Consultation Note Initial consult with this mom and term baby, now 4 hours old, and breast fed times 3. On exam, mom has very wide spaced b On exam of baby,s mouth, she appears to have a thin, tight frenulum, which could be why she is on and off breast. A nipple shield may be indicated to help baby maintain latch, but it is too early to start now. reast, that are very small with very little breast tissue. Mom has easily expressed colostrum, and is able to latch baby. The baby will latch, but tends to come on and off breast.  Patient Name: Girl Ruwaida Wilding Today's Date: 06/28/2016 Reason for consult: Initial assessment   Maternal Data Formula Feeding for Exclusion: No Has patient been taught Hand Expression?: Yes Does the patient have breastfeeding experience prior to this delivery?: No  Feeding Feeding Type: Breast Fed Length of feed: 40 min  LATCH Score/Interventions Latch: Grasps breast easily, tongue down, lips flanged, rhythmical sucking.  Audible Swallowing: None Intervention(s): Skin to skin;Hand expression  Type of Nipple: Everted at rest and after stimulation  Comfort (Breast/Nipple): Soft / non-tender (extrememly wide spaced and tiny breasts)     Hold (Positioning): Assistance needed to correctly position infant at breast and maintain latch. Intervention(s): Breastfeeding basics reviewed;Support Pillows;Position options;Skin to skin  LATCH Score: 7  Lactation Tools Discussed/Used     Consult Status       Consult Status: Follow-up Date: 06/29/16 Follow-up type: In-patient    Alfred Levins 06/28/2016, 1:40 PM

## 2016-06-29 DIAGNOSIS — O4202 Full-term premature rupture of membranes, onset of labor within 24 hours of rupture: Secondary | ICD-10-CM

## 2016-06-29 DIAGNOSIS — O99344 Other mental disorders complicating childbirth: Secondary | ICD-10-CM

## 2016-06-29 DIAGNOSIS — O9962 Diseases of the digestive system complicating childbirth: Secondary | ICD-10-CM

## 2016-06-29 DIAGNOSIS — Z3A40 40 weeks gestation of pregnancy: Secondary | ICD-10-CM

## 2016-06-29 DIAGNOSIS — O9989 Other specified diseases and conditions complicating pregnancy, childbirth and the puerperium: Secondary | ICD-10-CM

## 2016-06-29 DIAGNOSIS — O99324 Drug use complicating childbirth: Secondary | ICD-10-CM

## 2016-06-29 DIAGNOSIS — O9952 Diseases of the respiratory system complicating childbirth: Secondary | ICD-10-CM

## 2016-06-29 DIAGNOSIS — O411230 Chorioamnionitis, third trimester, not applicable or unspecified: Secondary | ICD-10-CM

## 2016-06-29 DIAGNOSIS — Z87891 Personal history of nicotine dependence: Secondary | ICD-10-CM

## 2016-06-29 LAB — CBC
HEMATOCRIT: 27.6 % — AB (ref 36.0–46.0)
Hemoglobin: 9.2 g/dL — ABNORMAL LOW (ref 12.0–15.0)
MCH: 31.4 pg (ref 26.0–34.0)
MCHC: 33.3 g/dL (ref 30.0–36.0)
MCV: 94.2 fL (ref 78.0–100.0)
Platelets: 203 10*3/uL (ref 150–400)
RBC: 2.93 MIL/uL — ABNORMAL LOW (ref 3.87–5.11)
RDW: 14.8 % (ref 11.5–15.5)
WBC: 13.4 10*3/uL — ABNORMAL HIGH (ref 4.0–10.5)

## 2016-06-29 NOTE — Progress Notes (Addendum)
Post Partum Day  1 Subjective: no complaints, up ad lib, voiding, tolerating PO, + flatus and BM  ROS negative  Objective: Blood pressure 116/62, pulse 72, temperature 97.3 F (36.3 C), temperature source Oral, resp. rate 18, height 5\' 7"  (1.702 m), weight 221 lb (100.2 kg), last menstrual period 09/19/2015, SpO2 99 %, unknown if currently breastfeeding.  Physical Exam:  General: alert, cooperative and no distress, swollen ankles bilaterally Lochia: appropriate Uterine Fundus: firm Incision: NA DVT Evaluation: No evidence of DVT seen on physical exam.   Recent Labs  06/27/16 0410 06/29/16 0605  HGB 12.5 9.2*  HCT 36.8 27.6*    Assessment/Plan: Concerned about milk supply, offered reasurance Plan for discharge tomorrow and Contraception : Depo     LOS: 2 days   Andres Ege, MD, PGY-1, MPH 06/29/2016, 7:18 AM    CNM attestation Post Partum Day #1 I have seen and examined this patient and agree with above documentation in the resident's note.   Olivia Fuller is a 30 y.o. G1P1001 s/p SVD.  Pt denies problems with ambulating, voiding or po intake. Pain is well controlled.  Plan for birth control is Depo-Provera.  Method of Feeding: breast  PE:  BP 116/62 (BP Location: Right Arm)   Pulse 72   Temp 97.3 F (36.3 C) (Oral)   Resp 18   Ht 5\' 7"  (1.702 m)   Wt 100.2 kg (221 lb)   LMP 09/19/2015   SpO2 99%   Breastfeeding? Unknown   BMI 34.61 kg/m  Fundus firm  Plan for discharge: 06/30/16  Cam Hai, CNM 9:20 AM  06/29/2016

## 2016-06-30 ENCOUNTER — Encounter (HOSPITAL_COMMUNITY): Payer: Self-pay | Admitting: Advanced Practice Midwife

## 2016-06-30 MED ORDER — IBUPROFEN 600 MG PO TABS: 600.0000 mg | ORAL_TABLET | Freq: Four times a day (QID) | ORAL | 0 refills | Status: DC | PRN
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
## 2016-06-30 NOTE — Discharge Instructions (Signed)

## 2016-06-30 NOTE — Discharge Summary (Signed)
OB Discharge Summary     Patient Name: Olivia Fuller DOB: 1986-11-27 MRN: [id number]  Date of admission: 06/27/2016 Delivering MD: [name] D   Date of discharge: 06/30/2016  Admitting diagnosis: water broke Intrauterine pregnancy: [redacted]w[redacted]d     Secondary diagnosis:  Active Problems:   PROM (premature rupture of membranes)  Additional problems: asthma     Discharge diagnosis: Term Pregnancy Delivered                                                                                                Post partum procedures:none  Augmentation: Pitocin and Foley Balloon  Complications: Intrauterine Inflammation or infection (Chorioamniotis) and ROM>24 hours- given [facility]mp & [facility] course:  Induction of Labor With Vaginal Delivery   30 y.o. yo G1P1001 at [redacted]w[redacted]d was admitted to the hospital 06/27/2016 for induction of labor.  Indication for induction: PROM.  Patient had an uncomplicated labor course as follows: Membrane Rupture Time/Date: 2:00 AM ,06/27/2016   Intrapartum Procedures: Episiotomy: None [1]                                         Lacerations:  2nd degree [3];Perineal [11];3rd degree [4]  Patient had delivery of a Viable infant.  Information for the patient's newborn:  Modine, Luis [[id number]]  Delivery Method: Vaginal, Vacuum (Extractor) (Filed from Delivery Summary)   06/28/2016  Details of delivery can be found in separate delivery note.  Patient had a routine postpartum course. Patient is discharged home 06/30/16.   Physical exam  Vitals:   06/29/16 0629 06/29/16 0651 06/29/16 1735 06/30/16 0615  BP: 116/62  125/83 118/83  Pulse: 72  92 97  Resp: 18  18 18   Temp:  97.3 F (36.3 C) 97.6 F (36.4 C) 98.2 F (36.8 C)  TempSrc:  Oral Oral Oral  SpO2:      Weight:      Height:       General: alert and cooperative Lochia: appropriate Uterine Fundus: firm Incision: N/A DVT Evaluation: No evidence of DVT seen on physical exam. Labs: Lab Results   Component Value Date   WBC 13.4 (H) 06/29/2016   HGB 9.2 (L) 06/29/2016   HCT 27.6 (L) 06/29/2016   MCV 94.2 06/29/2016   PLT 203 06/29/2016   CMP Latest Ref Rng & Units 04/26/2016  Glucose 65 - 99 mg/dL 902(I)  BUN 6 - 20 mg/dL 9  Creatinine 0.97 - 3.53 mg/dL 2.99  Sodium 242 - 683 mmol/L 135  Potassium 3.5 - 5.1 mmol/L 3.5  Chloride 101 - 111 mmol/L 103  CO2 22 - 32 mmol/L 20(L)  Calcium 8.9 - 10.3 mg/dL 4.1(D)  Total Protein 6.5 - 8.1 g/dL 7.0  Total Bilirubin 0.3 - 1.2 mg/dL 0.4  Alkaline Phos 38 - 126 U/L 105  AST 15 - 41 U/L 18  ALT 14 - 54 U/L 11(L)    Discharge instruction: per After Visit Summary and "Baby and Me Booklet".  After visit meds:  Medication List    STOP taking these medications   calcium carbonate 500 MG chewable tablet Commonly known as:  TUMS - dosed in mg elemental calcium   promethazine 25 MG tablet Commonly known as:  PHENERGAN     TAKE these medications   ibuprofen 600 MG tablet Commonly known as:  ADVIL,MOTRIN Take 1 tablet (600 mg total) by mouth every 6 (six) hours as needed.   prenatal multivitamin Tabs tablet Take 1 tablet by mouth at bedtime. Reported on 12/09/2015       Diet: routine diet  Activity: Advance as tolerated. Pelvic rest for 6 weeks.   Outpatient follow up:6 weeks Follow up Appt: Future Appointments Date Time Provider Department Center  08/10/2016 10:00 AM Cheral Marker, CNM [facility]   Follow up Visit:No Follow-up on file.  Postpartum contraception: Depo Provera  Newborn Data: Live born female  Birth Weight: 9 lb 0.3 oz (4090 g) APGAR: 5, 7  Baby Feeding: Breast Disposition:home with mother   06/30/2016 Cam Hai, CNM  8:13 AM

## 2016-08-10 ENCOUNTER — Ambulatory Visit (INDEPENDENT_AMBULATORY_CARE_PROVIDER_SITE_OTHER): Payer: 59 | Admitting: Women's Health

## 2016-08-10 ENCOUNTER — Encounter: Payer: Self-pay | Admitting: Women's Health

## 2016-08-10 DIAGNOSIS — Z8759 Personal history of other complications of pregnancy, childbirth and the puerperium: Secondary | ICD-10-CM | POA: Insufficient documentation

## 2016-08-10 MED ORDER — NORETHINDRONE 0.35 MG PO TABS: ORAL_TABLET | ORAL | 11 refills | Status: DC

## 2016-08-10 NOTE — Patient Instructions (Addendum)
Tips To Increase Milk Supply  Lots of water! Enough so that your urine is clear  Plenty of calories, if you're not getting enough calories, your milk supply can decrease  Breastfeed/pump often, every 2-3 hours x 20-46mins  Fenugreek 3 pills 3 times a day, this may make your urine smell like maple syrup  Mother's Milk Tea  Lactation cookies, google for the recipe  Real oatmeal  You can call and get appointment with Lactation consultant at [facility] if needed   Norethindrone tablets (contraception) What is this medicine? NORETHINDRONE (nor eth IN drone) is an oral contraceptive. The product contains a female hormone known as a progestin. It is used to prevent pregnancy. This medicine may be used for other purposes; ask your health care provider or pharmacist if you have questions. What should I tell my health care provider before I take this medicine? They need to know if you have any of these conditions: -blood vessel disease or blood clots -breast, cervical, or vaginal cancer -diabetes -heart disease -kidney disease -liver disease -mental depression -migraine -seizures -stroke -vaginal bleeding -an unusual or allergic reaction to norethindrone, other medicines, foods, dyes, or preservatives -pregnant or trying to get pregnant -breast-feeding How should I use this medicine? Take this medicine by mouth with a glass of water. You may take it with or without food. Follow the directions on the prescription label. Take this medicine at the same time each day and in the order directed on the package. Do not take your medicine more often than directed. Contact your pediatrician regarding the use of this medicine in children. Special care may be needed. This medicine has been used in female children who have started having menstrual periods. A patient package insert for the product will be given with each prescription and refill. Read this sheet carefully each time. The sheet may change  frequently. Overdosage: If you think you have taken too much of this medicine contact a poison control center or emergency room at once. NOTE: This medicine is only for you. Do not share this medicine with others. What if I miss a dose? Try not to miss a dose. Every time you miss a dose or take a dose late your chance of pregnancy increases. When 1 pill is missed (even if only 3 hours late), take the missed pill as soon as possible and continue taking a pill each day at the regular time (use a back up method of birth control for the next 48 hours). If more than 1 dose is missed, use an additional birth control method for the rest of your pill pack until menses occurs. Contact your health care professional if more than 1 dose has been missed. What may interact with this medicine? Do not take this medicine with any of the following medications: -amprenavir or fosamprenavir -bosentan This medicine may also interact with the following medications: -antibiotics or medicines for infections, especially rifampin, rifabutin, rifapentine, and griseofulvin, and possibly penicillins or tetracyclines -aprepitant -barbiturate medicines, such as phenobarbital -carbamazepine -felbamate -modafinil -oxcarbazepine -phenytoin -ritonavir or other medicines for HIV infection or AIDS -St. John's wort -topiramate This list may not describe all possible interactions. Give your health care provider a list of all the medicines, herbs, non-prescription drugs, or dietary supplements you use. Also tell them if you smoke, drink alcohol, or use illegal drugs. Some items may interact with your medicine. What should I watch for while using this medicine? Visit your doctor or health care professional for regular checks on your  progress. You will need a regular breast and pelvic exam and Pap smear while on this medicine. Use an additional method of birth control during the first cycle that you take these tablets. If you have  any reason to think you are pregnant, stop taking this medicine right away and contact your doctor or health care professional. If you are taking this medicine for hormone related problems, it may take several cycles of use to see improvement in your condition. This medicine does not protect you against HIV infection (AIDS) or any other sexually transmitted diseases. What side effects may I notice from receiving this medicine? Side effects that you should report to your doctor or health care professional as soon as possible: -breast tenderness or discharge -pain in the abdomen, chest, groin or leg -severe headache -skin rash, itching, or hives -sudden shortness of breath -unusually weak or tired -vision or speech problems -yellowing of skin or eyes Side effects that usually do not require medical attention (report to your doctor or health care professional if they continue or are bothersome): -changes in sexual desire -change in menstrual flow -facial hair growth -fluid retention and swelling -headache -irritability -nausea -weight gain or loss This list may not describe all possible side effects. Call your doctor for medical advice about side effects. You may report side effects to FDA at 1-800-FDA-1088. Where should I keep my medicine? Keep out of the reach of children. Store at room temperature between 15 and 30 degrees C (59 and 86 degrees F). Throw away any unused medicine after the expiration date. NOTE: This sheet is a summary. It may not cover all possible information. If you have questions about this medicine, talk to your doctor, pharmacist, or health care provider.    2016, Elsevier/Gold Standard. (2012-08-11 16:41:35)

## 2016-08-10 NOTE — Progress Notes (Signed)
Subjective:    Ola SpurrHeather M Baumgarten is a 30 y.o. 511P1001 Caucasian female who presents for a postpartum visit. She is 5 weeks postpartum following a vacuum, low at 40.3 gestational weeks. Anesthesia: epidural. Partial 3rd degree. Pt states JVF says shoulders got stuck a little bit, no mention in delivery note. Baby weighed 9lb0oz. I have fully reviewed the prenatal and intrapartum course. Postpartum course has been uncomplicated. Baby's course has been complicated by not tolerating formula- having to change it 4x's and still having constipation-on prune juice. Baby is feeding by breast x 4d, then bottle. Pt wants to start breastfeeding again. Bleeding thinks period starting today. Bowel function is normal. Bladder function is normal. Patient is sexually active. Last sexual activity: ~2wks ago. Contraception method is condoms and wants pop's. Postpartum depression screening: negative. Score 1.  Last pap 2015 @ [facility] and was neg. Taking oxycodone 10mg  rx'd by PCP for back pain- was not on this during pregnancy, states she had stopped b/c she didn't want baby exposed in utero. Has had disc 'shaved'. States she did pretty well w/o it during pregnancy.   The following portions of the patient's history were reviewed and updated as appropriate: allergies, current medications, past medical history, past surgical history and problem list.  Review of Systems Pertinent items are noted in HPI.   Vitals:   08/10/16 1008  BP: 110/74  Pulse: 92  Weight: 197 lb (89.4 kg)   Patient's last menstrual period was 09/19/2015.  Objective:   General:  alert, cooperative and no distress   Breasts:  deferred, no complaints  Lungs: clear to auscultation bilaterally  Heart:  regular rate and rhythm  Abdomen: soft, nontender   Vulva: normal  Vagina: normal vagina  Cervix:  closed  Corpus: Well-involuted  Adnexa:  Non-palpable  Rectal Exam: No hemorrhoids        Assessment:   Postpartum exam 5 wks s/p VAVB w/  ?mild shoulder dystocia and partial 3rd degree lac Bottlefeeding, wants to resume breastfeeding Depression screening Contraception counseling   Plan:  Discussed and gave printed info to resume breastfeeding/increase supply, can also call and make appt w/ [facility] at [facility] Recommended weaning off oxycodone, did well w/o it during pregnancy, can try non-opioid pain relieving measures if needed. Should also discuss this w/ rx'ing provider Contraception: Rx micronor w/ 11RF, understands has to take at exact same time daily to be effective- set alarm to remind Follow up in: Jan for pap & physical, or earlier if needed  Marge DuncansBooker, Kimberly Randall CNM, [facility]NP-BC 08/10/2016 10:16 AM

## 2016-12-23 ENCOUNTER — Other Ambulatory Visit: Payer: 59 | Admitting: Women's Health

## 2017-03-09 ENCOUNTER — Ambulatory Visit (INDEPENDENT_AMBULATORY_CARE_PROVIDER_SITE_OTHER): Payer: Medicaid Other | Admitting: Women's Health

## 2017-03-09 ENCOUNTER — Encounter: Payer: Self-pay | Admitting: Women's Health

## 2017-03-09 ENCOUNTER — Other Ambulatory Visit (HOSPITAL_COMMUNITY)
Admission: RE | Admit: 2017-03-09 | Discharge: 2017-03-09 | Disposition: A | Discharge status: Home / Self Care | Payer: Medicaid Other | Source: Ambulatory Visit | Attending: Obstetrics & Gynecology | Admitting: Obstetrics & Gynecology

## 2017-03-09 VITALS — BP 92/70 | HR 88 | Ht 67.0 in | Wt 212.0 lb

## 2017-03-09 DIAGNOSIS — Z8279 Family history of other congenital malformations, deformations and chromosomal abnormalities: Secondary | ICD-10-CM

## 2017-03-09 DIAGNOSIS — Z8742 Personal history of other diseases of the female genital tract: Secondary | ICD-10-CM | POA: Diagnosis not present

## 2017-03-09 DIAGNOSIS — Z8249 Family history of ischemic heart disease and other diseases of the circulatory system: Secondary | ICD-10-CM | POA: Diagnosis not present

## 2017-03-09 DIAGNOSIS — Z1151 Encounter for screening for human papillomavirus (HPV): Secondary | ICD-10-CM | POA: Insufficient documentation

## 2017-03-09 DIAGNOSIS — Z113 Encounter for screening for infections with a predominantly sexual mode of transmission: Secondary | ICD-10-CM | POA: Diagnosis present

## 2017-03-09 DIAGNOSIS — Z79891 Long term (current) use of opiate analgesic: Secondary | ICD-10-CM | POA: Diagnosis not present

## 2017-03-09 DIAGNOSIS — Z01419 Encounter for gynecological examination (general) (routine) without abnormal findings: Secondary | ICD-10-CM | POA: Insufficient documentation

## 2017-03-09 NOTE — Progress Notes (Signed)
Subjective:   Olivia Fuller is a 31 y.o. G79P1001 Caucasian female here for a routine well-woman exam.  Patient's last menstrual period was 02/17/2017.    Current complaints: wants to start trying to get pregnant again. Tried 41yrs prior to last pregnancy- then got in trial at [facility], was told eggs were too small- was given clomid  then , fob was given 'some kind of pill' b/c his sperm were 'crooked', then pt underwent IUI and had successful pregnancy. Daughter 12 months old now. Bottlefeeding. Stopped micronor last month, has had one period. Is taking pnv. Has been taking oxycodone  TID for chronic LBP, has weaned herself to once daily. She completely stopped during her last pregnancy.  Daughter dx w/ urinary reflux @ of age, followed by ped urologist at [facility], on antibiotics x 58yr, will not likely need surgery.  Reports since last pregnancy she found out fob's son by another mother has aortic stenosis PCP: Dr. Madaline Savage, [place]       Does not desire labs, done w/ PCP  Social History: Sexual: heterosexual Marital Status: engaged Living situation: w/ fiance Occupation: unemployed Tobacco/alcohol: no tobacco, etoh: none Illicit drugs: no history of illicit drug use  The following portions of the patient's history were reviewed and updated as appropriate: allergies, current medications, past family history, past medical history, past social history, past surgical history and problem list.  Past Medical History Past Medical History:  Diagnosis Date  . Anxiety   . Asthma   . DDD (degenerative disc disease)   . Scoliosis   . UTI (lower urinary tract infection)     Past Surgical History Past Surgical History:  Procedure Laterality Date  . BACK SURGERY    . LUMBAR LAMINECTOMY/DECOMPRESSION MICRODISCECTOMY  07/06/2012   Procedure: LUMBAR LAMINECTOMY/DECOMPRESSION MICRODISCECTOMY 1 LEVEL;  Surgeon: Cristi Loron, MD;  Location: [facility];  Service: Neurosurgery;   Laterality: Left;  LEFT Lumbar Five-Sacral One diskectomy  . No Surgical History      Gynecologic History G1P1001  Patient's last menstrual period was 02/17/2017. Contraception: none Last Pap: ~2016. Results were: abnormal Last mammogram: never. Results were: n/a Last TCS: never  Obstetric History OB History  Gravida Para Term Preterm AB Living  SAB TAB Ectopic Multiple Live Births        0 1    # Outcome Date GA Lbr Len/2nd Weight Sex Delivery Anes PTL Lv  1 Term 06/28/16 [redacted]w[redacted]d 10:40 / 02:18 9 lb 0.3 oz (4.09 kg) F Vag-Vacuum EPI  LIV      Current Medications Current Outpatient Prescriptions on File Prior to Visit  Medication Sig Dispense Refill  . Oxycodone HCl 10 MG TABS Take by mouth.    . Prenatal Vit-Fe Fumarate-FA (PRENATAL MULTIVITAMIN) TABS tablet Take 1 tablet by mouth at bedtime. Reported on 12/09/2015     No current facility-administered medications on file prior to visit.     Review of Systems Patient denies any headaches, blurred vision, shortness of breath, chest pain, abdominal pain, problems with bSTEVIE ERTLEnts, urination, or intercourse.  Objective:  BP 92/70 (BP Location: Right Arm, Patient Position: Sitting, Cuff Size: Large)   Pulse 88   Ht  (1.702 m)   Wt 212 lb (96.2 kg)   LMP 02/17/2017   BMI 33.20 kg/m  Physical Exam  General:  Well developed, well nourished, no acute distress. She is alert and oriented x3. Skin:  Warm and dry  Neck:  Midline trachea, no thyromegaly or nodules Cardiovascular: Regular rate and rhythm, no murmur heard Lungs:  Effort normal, all lung fields clear to auscultation bilaterally Breasts:  No dominant palpable mass, retraction, or nipple discharge Abdomen:  Soft, non tender, no hepatosplenomegaly or masses Pelvic:  External genitalia is normal in appearance.  The vagina is normal in appearance. The cervix is bulbous, no CMT.  Thin prep pap is done w/ HR HPV cotesting. Uterus is felt to be normal  size, shape, and contour.  No adnexal masses or tenderness noted. Extremities:  No swelling or varicosities noted Psych:  She has a normal mood and affect  Assessment:   Healthy well-woman exam Opiate use for chronic LBP H/O infertility  Plan:  Continue pnv, continue weaning off of oxycodone in anticipation of pregnancy Discussed trying to conceive w/o assistance x , if still hasn't conceived to let us know, will consider clomid Gave printed ferility tips F/U 74yr for physical, or sooner if needed Mammogram  or sooner if problems Colonoscopy  or sooner if problems  Marge Duncans CNM, WHNP-BC 03/09/2017 9:02 AM

## 2017-03-09 NOTE — Patient Instructions (Signed)
Call in 6 months if not pregnant yet  If you are trying to get pregnant:   Have sex every other day on days 7-24 of your cycle (Day 1 is the 1st day of your period)  [place] before sex  Lay with your hips elevated on pillows for 20-26mins after sex  Do not smoke or drink alcohol  Lose weight if you are overweight  Take a prenatal vitamin with at least of folic acid  Decrease stress in your life  For Him:   Wear boxers instead of briefs  Avoid hot baths/jacuzzi  Vit C supplement  Do not smoke or drink alcohol  Lose weight if you are overweight  Keep in mind that it can be normal to take up to a year to become pregnant 80-90% of women will become pregnant within 1 year of trying

## 2017-03-11 LAB — CYTOLOGY - PAP
Chlamydia: NEGATIVE
DIAGNOSIS: NEGATIVE
HPV (WINDOPATH): NOT DETECTED
NEISSERIA GONORRHEA: NEGATIVE

## 2017-06-20 ENCOUNTER — Encounter (HOSPITAL_COMMUNITY): Payer: Self-pay

## 2017-06-20 ENCOUNTER — Emergency Department (HOSPITAL_COMMUNITY): Payer: Medicaid Other

## 2017-06-20 ENCOUNTER — Emergency Department (HOSPITAL_COMMUNITY)
Admission: EM | Admit: 2017-06-20 | Discharge: 2017-06-20 | Disposition: A | Discharge status: Home / Self Care | Payer: Medicaid Other | Attending: Emergency Medicine | Admitting: Emergency Medicine

## 2017-06-20 DIAGNOSIS — Z87891 Personal history of nicotine dependence: Secondary | ICD-10-CM | POA: Insufficient documentation

## 2017-06-20 DIAGNOSIS — M79671 Pain in right foot: Secondary | ICD-10-CM | POA: Insufficient documentation

## 2017-06-20 DIAGNOSIS — J45909 Unspecified asthma, uncomplicated: Secondary | ICD-10-CM | POA: Diagnosis not present

## 2017-06-20 MED ORDER — IBUPROFEN 600 MG PO TABS: 600.0000 mg | ORAL_TABLET | Freq: Four times a day (QID) | ORAL | 0 refills | Status: DC | PRN

## 2017-06-20 NOTE — ED Triage Notes (Signed)
Right foot pain/swelling x1.5 week. Denies injury.

## 2017-06-20 NOTE — Discharge Instructions (Signed)
Your xrays are negative today for any signs of infection or evidence for foot fracture.  Wear the post op shoe for comfort and to protect your foot.  Call Dr. Janna Archondiego for a recheck of your pain if not improved over the next week. Use ice and elevation and also applying an ace wrap can help with support of your foot.

## 2017-06-20 NOTE — ED Notes (Signed)
C/o right foot pain, denies injury.  

## 2017-06-21 NOTE — ED Provider Notes (Signed)
[facility]EMERG[facility]Y DEPT Provider Note   CSN: [id number] Arrival date & time: 06/20/17  1134     History   Chief Complaint Chief Complaint  Patient presents with  . Foot Pain    HPI Olivia Fuller is a 31 y.o. female presenting with right foot pain and localized swelling for the past week.  She denies injury but recently returned to her job which requires prolonged standing after having a year long maternity leave.  She denies radiation of pain, fevers, chills, numbness distal to the pain site.  She has not worn new shoes, high heels nor has her activities changed except for her work return.  She has found no alleviators.  HPI  Past Medical History:  Diagnosis Date  . Anxiety   . Asthma   . DDD (degenerative disc disease)   . Scoliosis   . UTI (lower urinary tract infection)     Patient Active Problem List   Diagnosis Date Noted  . Chronic prescription opiate use 03/09/2017  . History of infertility 03/09/2017  . Family history of aortic stenosis 03/09/2017  . 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 History of shoulder dystocia in prior pregnancy 08/10/2016  . 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 Anxiety 11/11/2015  . ALLERGIC RHINITIS 11/24/2010  . ASTHMA 11/24/2010  . GERD 11/24/2010  . DYSPNEA 11/24/2010  . CHEST PAIN, ATYPICAL, HX OF 11/24/2010    Past Surgical History:  Procedure Laterality Date  . BACK SURGERY    . LUMBAR LAMINECTOMY/DECOMPRESSION MICRODISCECTOMY  07/06/2012   Procedure: LUMBAR LAMINECTOMY/DECOMPRESSION MICRODISCECTOMY 1 LEVEL;  Surgeon: Cristi LoronJeffrey D Jenkins, MD;  Location: [facility];  Service: Neurosurgery;  Laterality: Left;  LEFT Lumbar Five-Sacral One diskectomy  . No Surgical History      OB History    Gravida Para Term Preterm AB Living   1 1 1     1    SAB TAB Ectopic Multiple Live Births         0 1       Home Medications    Prior to Admission medications   Medication Sig Start Date End Date Taking? Authorizing Provider    ibuprofen (ADVIL,MOTRIN) 600 MG tablet Take 1 tablet (600 mg total) by mouth every 6 (six) hours as needed. 06/20/17   Burgess AmorIdol, Kasee Hantz, PA-C  Oxycodone HCl 10 MG TABS Take by mouth.    [provider]  Prenatal Vit-Fe Fumarate-FA (PRENATAL MULTIVITAMIN) TABS tablet Take 1 tablet by mouth at bedtime. Reported on 12/09/2015    [provider]    Family History Family History  Problem Relation Age of Onset  . Seizures Mother   . Alcohol abuse Mother   . Drug abuse Mother   . Bipolar disorder Father   . Drug abuse Brother   . Alzheimer's disease Maternal Grandmother   . Lumbar disc disease Maternal Grandmother   . Cancer Paternal Grandmother        colon  . Hyperlipidemia Paternal Grandmother   . Hypertension Paternal Grandmother   . Cancer Paternal Grandfather        prostate, bone  . Diabetes Paternal Grandfather   . Heart disease Paternal Grandfather   . Other Daughter        acid reflux    Social History Social History  Substance Use Topics  . Smoking status: Former Smoker    Years: 5.00    Quit date: 02/14/2012  . Smokeless tobacco: Former NeurosurgeonUser  . Alcohol use No  Allergies   Tylenol [acetaminophen] and Zofran [ondansetron hcl]   Review of Systems Review of Systems  Constitutional: Negative for fever.  Musculoskeletal: Positive for arthralgias and joint swelling. Negative for myalgias.  Neurological: Negative for weakness and numbness.     Physical Exam Updated Vital Signs BP 120/73 (BP Location: Right Arm)   Pulse 67   Temp 98 F (36.7 C) (Oral)   Resp 16   Ht 5\' 7"  (1.702 m)   Wt 97.5 kg (215 lb)   LMP 05/21/2017   SpO2 98%   BMI 33.67 kg/m   Physical Exam  Constitutional: She appears well-developed and well-nourished.  HENT:  Head: Atraumatic.  Neck: Normal range of motion.  Cardiovascular:  Pulses equal bilaterally  Musculoskeletal: She exhibits tenderness.       Right foot: There is tenderness and swelling. There is normal  capillary refill, no crepitus, no deformity and no laceration.       Feet:  Localized ttp right distal dorsal foot proximal to the 2nd and 3rd toes with mild edema noted. Skin intact, no rash or wounds.  Pt can flex/ext her toes, distal sensation intact with less than 2 sec cap refill.  Dorsalis pedal pulse full.  No deformity.  Neurological: She is alert. She has normal strength. She displays normal reflexes. No sensory deficit.  Skin: Skin is warm and dry.  Psychiatric: She has a normal mood and affect.     ED Treatments / Results  Labs (all labs ordered are listed, but only abnormal results are displayed) Labs Reviewed - No data to display  EKG  EKG Interpretation None       Radiology Dg Foot Complete Right  Result Date: 06/20/2017 CLINICAL DATA:  Right foot pain and swelling.  No reported injury. EXAM: RIGHT FOOT COMPLETE - 3+ VIEW COMPARISON:  None. FINDINGS: Soft tissue swelling in the distal right foot at the level of the metatarsophalangeal joints. No fracture or dislocation. No suspicious focal osseous lesion. No appreciable arthropathy. No radiopaque foreign body. IMPRESSION: Soft tissue swelling in the distal right foot. No acute osseous abnormality. Electronically Signed   By: Delbert Phenix M.D.   On: 06/20/2017 13:50    Procedures Procedures (including critical care time)  Medications Ordered in ED Medications - No data to display   Initial Impression / Assessment and Plan / ED Course  I have reviewed the triage vital signs and the nursing notes.  Pertinent labs & imaging results that were available during my care of the patient were reviewed by me and considered in my medical decision making (see chart for details).     Suspect chronic overuse, pt has just returned to work after a year off requiring standing entire shift.  Advised ibuprofen, ice tx. Placed in post op shoe.  Discussed stress fractures, but doubt this condition.  Plan f/u with pcp for a recheck if  sx persist over the next 7-10 days. May indicate need for further imaging if sx persist.  Final Clinical Impressions(s) / ED Diagnoses   Final diagnoses:  Foot pain, right    New Prescriptions Discharge Medication List as of 06/20/2017  3:02 PM    START taking these medications   Details  ibuprofen (ADVIL,MOTRIN) 600 MG tablet Take 1 tablet (600 mg total) by mouth every 6 (six) hours as needed., Starting Mon 06/20/2017, Print         Burgess Amor, PA-C 06/21/17 1610    Blane Ohara, MD 06/22/17 [phone]

## 2017-07-06 ENCOUNTER — Other Ambulatory Visit (HOSPITAL_COMMUNITY): Payer: Self-pay | Admitting: Family Medicine

## 2017-07-06 DIAGNOSIS — M5441 Lumbago with sciatica, right side: Principal | ICD-10-CM

## 2017-07-06 DIAGNOSIS — M5442 Lumbago with sciatica, left side: Principal | ICD-10-CM

## 2017-07-06 DIAGNOSIS — G8929 Other chronic pain: Secondary | ICD-10-CM

## 2017-07-08 ENCOUNTER — Ambulatory Visit (HOSPITAL_COMMUNITY)
Admission: RE | Admit: 2017-07-08 | Discharge: 2017-07-08 | Disposition: A | Discharge status: Home / Self Care | Payer: Medicaid Other | Source: Ambulatory Visit | Attending: Family Medicine | Admitting: Family Medicine

## 2017-07-08 ENCOUNTER — Encounter (HOSPITAL_COMMUNITY): Payer: Self-pay

## 2017-07-08 DIAGNOSIS — M5442 Lumbago with sciatica, left side: Principal | ICD-10-CM

## 2017-07-08 DIAGNOSIS — M5441 Lumbago with sciatica, right side: Principal | ICD-10-CM

## 2017-07-08 DIAGNOSIS — G8929 Other chronic pain: Secondary | ICD-10-CM

## 2017-07-15 ENCOUNTER — Ambulatory Visit (HOSPITAL_COMMUNITY)
Admission: RE | Admit: 2017-07-15 | Discharge: 2017-07-15 | Disposition: A | Discharge status: Home / Self Care | Payer: Medicaid Other | Source: Ambulatory Visit | Attending: Family Medicine | Admitting: Family Medicine

## 2017-07-15 DIAGNOSIS — M5442 Lumbago with sciatica, left side: Secondary | ICD-10-CM | POA: Diagnosis not present

## 2017-07-15 DIAGNOSIS — M48061 Spinal stenosis, lumbar region without neurogenic claudication: Secondary | ICD-10-CM | POA: Insufficient documentation

## 2017-07-15 DIAGNOSIS — M5137 Other intervertebral disc degeneration, lumbosacral region: Secondary | ICD-10-CM | POA: Diagnosis not present

## 2017-07-15 DIAGNOSIS — M5126 Other intervertebral disc displacement, lumbar region: Secondary | ICD-10-CM | POA: Diagnosis not present

## 2017-07-15 DIAGNOSIS — M8938 Hypertrophy of bone, other site: Secondary | ICD-10-CM | POA: Insufficient documentation

## 2017-07-15 DIAGNOSIS — M5441 Lumbago with sciatica, right side: Secondary | ICD-10-CM | POA: Diagnosis not present

## 2017-07-15 DIAGNOSIS — M4186 Other forms of scoliosis, lumbar region: Secondary | ICD-10-CM | POA: Diagnosis not present

## 2017-07-15 DIAGNOSIS — M545 Low back pain: Secondary | ICD-10-CM | POA: Diagnosis present

## 2017-07-15 DIAGNOSIS — M5136 Other intervertebral disc degeneration, lumbar region: Secondary | ICD-10-CM | POA: Insufficient documentation

## 2017-07-15 DIAGNOSIS — G8929 Other chronic pain: Secondary | ICD-10-CM | POA: Insufficient documentation

## 2017-07-15 DIAGNOSIS — Z9889 Other specified postprocedural states: Secondary | ICD-10-CM | POA: Insufficient documentation

## 2017-11-12 ENCOUNTER — Other Ambulatory Visit: Payer: Self-pay

## 2017-11-12 ENCOUNTER — Encounter (HOSPITAL_COMMUNITY): Payer: Self-pay | Admitting: Emergency Medicine

## 2017-11-12 ENCOUNTER — Emergency Department (HOSPITAL_COMMUNITY)
Admission: EM | Admit: 2017-11-12 | Discharge: 2017-11-12 | Disposition: A | Discharge status: Home / Self Care | Payer: Medicaid Other | Attending: Emergency Medicine | Admitting: Emergency Medicine

## 2017-11-12 DIAGNOSIS — Z87891 Personal history of nicotine dependence: Secondary | ICD-10-CM | POA: Insufficient documentation

## 2017-11-12 DIAGNOSIS — J029 Acute pharyngitis, unspecified: Secondary | ICD-10-CM | POA: Diagnosis not present

## 2017-11-12 DIAGNOSIS — Z79899 Other long term (current) drug therapy: Secondary | ICD-10-CM | POA: Diagnosis not present

## 2017-11-12 DIAGNOSIS — J45909 Unspecified asthma, uncomplicated: Secondary | ICD-10-CM | POA: Diagnosis not present

## 2017-11-12 LAB — RAPID STREP SCREEN (MED CTR MEBANE ONLY): Streptococcus, Group A Screen (Direct): NEGATIVE

## 2017-11-12 MED ORDER — MAGIC MOUTHWASH W/LIDOCAINE: 10.0000 mL | Freq: Four times a day (QID) | ORAL | 0 refills | Status: DC | PRN

## 2017-11-12 MED ORDER — IBUPROFEN 600 MG PO TABS: 600.0000 mg | ORAL_TABLET | Freq: Four times a day (QID) | ORAL | 0 refills | Status: DC | PRN

## 2017-11-12 NOTE — ED Triage Notes (Signed)
Pt c/o of sore throat and cough starting 3 days ago. No fever.

## 2017-11-12 NOTE — ED Provider Notes (Signed)
[facility] EMERGENCY DEPART[facility] Provider Note   CSN: [id number] Arrival date & time: 11/12/17  1022     History   Chief Complaint Chief Complaint  Patient presents with  . Sore Throat    HPI Olivia Fuller is a 31 y.o. female.  The history is provided by the patient.  Sore Throat  This is a new problem. The current episode started more than 2 days ago. The problem occurs constantly. The problem has been gradually worsening. Pertinent negatives include no chest pain, no abdominal pain, no headaches and no shortness of breath. Associated symptoms comments: Nasal congestion with clear rhinorrhea. The symptoms are aggravated by swallowing. Nothing relieves the symptoms. Treatments tried: ibuprofen. The treatment provided no relief.    Past Medical History:  Diagnosis Date  . Anxiety   . Asthma   . DDD (degenerative disc disease)   . Scoliosis   . UTI (lower urinary tract infection)     Patient Active Problem List   Diagnosis Date Noted  . Chronic prescription opiate use 03/09/2017  . History of infertility 03/09/2017  . Family history of aortic stenosis 03/09/2017  . 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 History of shoulder dystocia in prior pregnancy 08/10/2016  . 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 Anxiety 11/11/2015  . ALLERGIC RHINITIS 11/24/2010  . ASTHMA 11/24/2010  . GERD 11/24/2010  . DYSPNEA 11/24/2010  . CHEST PAIN, ATYPICAL, HX OF 11/24/2010    Past Surgical History:  Procedure Laterality Date  . BACK SURGERY    . LUMBAR LAMINECTOMY/DECOMPRESSION MICRODISCECTOMY  07/06/2012   Procedure: LUMBAR LAMINECTOMY/DECOMPRESSION MICRODISCECTOMY 1 LEVEL;  Surgeon: Cristi LoronJeffrey D Jenkins, MD;  Location: [facility];  Service: Neurosurgery;  Laterality: Left;  LEFT Lumbar Five-Sacral One diskectomy  . No Surgical History      OB History    Gravida Para Term Preterm AB Living   1 1 1     1    SAB TAB Ectopic Multiple Live Births         0 1       Home  Medications    Prior to Admission medications   Medication Sig Start Date End Date Taking? Authorizing Provider  amphetamine-dextroamphetamine (ADDERALL) 20 MG tablet Take 10-20 mg by mouth 3 (three) times daily. Take 20 mg in the morning, 10 mg at lunch, and 10 mg at supper.   Yes [provider]  Oxycodone HCl 10 MG TABS Take 10 mg by mouth 3 (three) times daily.   Yes [provider]  ibuprofen (ADVIL,MOTRIN) 600 MG tablet Take 1 tablet (600 mg total) by mouth every 6 (six) hours as needed. 11/12/17   Burgess AmorIdol, Cynethia Schindler, PA-C  magic mouthwash w/lidocaine SOLN Take 10 mLs by mouth 4 (four) times daily as needed (throat pain). Note to pharmacy - equal parts diphendydramine, aluminum hydroxide and lidocaine HCL 11/12/17   Burgess AmorIdol, Indea Dearman, PA-C    Family History Family History  Problem Relation Age of Onset  . Seizures Mother   . Alcohol abuse Mother   . Drug abuse Mother   . Bipolar disorder Father   . Drug abuse Brother   . Alzheimer's disease Maternal Grandmother   . Lumbar disc disease Maternal Grandmother   . Cancer Paternal Grandmother        colon  . Hyperlipidemia Paternal Grandmother   . Hypertension Paternal Grandmother   . Cancer Paternal Grandfather        prostate, bone  . Diabetes Paternal Grandfather   .  Heart disease Paternal Grandfather   . Other Daughter        acid reflux    Social History Social History   Tobacco Use  . Smoking status: Former Smoker    Years: 5.00    Last attempt to quit: 02/14/2012    Years since quitting: 5.7  . Smokeless tobacco: Former Engineer, waterUser  Substance Use Topics  . Alcohol use: No  . Drug use: No     Allergies   Tylenol [acetaminophen] and Zofran [ondansetron hcl]   Review of Systems Review of Systems  Constitutional: Negative for chills and fever.  HENT: Positive for congestion, rhinorrhea and sore throat. Negative for ear pain, sinus pressure, trouble swallowing and voice change.   Eyes: Negative for discharge.    Respiratory: Negative for cough, shortness of breath, wheezing and stridor.   Cardiovascular: Negative for chest pain.  Gastrointestinal: Negative for abdominal pain.  Genitourinary: Negative.   Neurological: Negative for headaches.     Physical Exam Updated Vital Signs BP 122/86 (BP Location: Right Arm)   Pulse 92   Temp 97.8 F (36.6 C) (Oral)   Resp 18   Ht 5\' 7"  (1.702 m)   Wt 97.5 kg (215 lb)   LMP 10/13/2017   SpO2 95%   BMI 33.67 kg/m   Physical Exam  Constitutional: She is oriented to person, place, and time. She appears well-developed and well-nourished.  HENT:  Head: Normocephalic and atraumatic.  Right Ear: Tympanic membrane and ear canal normal.  Left Ear: Tympanic membrane and ear canal normal.  Nose: Mucosal edema present. No rhinorrhea.  Mouth/Throat: Uvula is midline and mucous membranes are normal. Posterior oropharyngeal erythema present. No oropharyngeal exudate, posterior oropharyngeal edema or tonsillar abscesses.  Mild posterior pharyngeal erythema.  No exudate, no edema.  Eyes: Conjunctivae are normal.  Neck:  No head or neck adenopathy.  Cardiovascular: Normal rate and normal heart sounds.  Pulmonary/Chest: Effort normal. No respiratory distress. She has no wheezes. She has no rales.  Musculoskeletal: Normal range of motion.  Neurological: She is alert and oriented to person, place, and time.  Skin: Skin is warm and dry. No rash noted.  Psychiatric: She has a normal mood and affect.     ED Treatments / Results  Labs (all labs ordered are listed, but only abnormal results are displayed) Labs Reviewed  RAPID STREP SCREEN (NOT AT [facility])  CULTURE, GROUP A STREP [facility])    EKG  EKG Interpretation None       Radiology No results found.  Procedures Procedures (including critical care time)  Medications Ordered in ED Medications - No data to display   Initial Impression / Assessment and Plan / ED Course  I have reviewed the triage  vital signs and the nursing notes.  Pertinent labs & imaging results that were available during my care of the patient were reviewed by me and considered in my medical decision making (see chart for details).     Patient with viral pharyngitis, strep test is negative.  She was prescribed prescription strength ibuprofen.  Discussed home treatments.  PRN follow-up anticipated.  Final Clinical Impressions(s) / ED Diagnoses   Final diagnoses:  Viral pharyngitis    ED Discharge Orders        Ordered    ibuprofen (ADVIL,MOTRIN) 600 MG tablet  Every 6 hours PRN     11/12/17 1122    magic mouthwash w/lidocaine SOLN  4 times daily PRN     11/12/17 1122  Burgess Amor, PA-C 11/12/17 1341    Samuel Jester, DO 11/12/17 1442

## 2017-11-12 NOTE — Discharge Instructions (Signed)
Your strep test is negative today as discussed, suggesting that your symptoms are viral and will run their course.  You may use the medications prescribed to help you with your pain symptoms while this is improving.

## 2017-11-15 LAB — CULTURE, GROUP A STREP (THRC)

## 2018-08-18 ENCOUNTER — Telehealth: Payer: Self-pay | Admitting: Obstetrics & Gynecology

## 2018-08-21 NOTE — Telephone Encounter (Signed)
Pt called stating that her cycle has been off this month. She states that it will start then stop and start and stop and this is very unusual for her. She states that it started Sept 6. Advised to give it a little more time and if she is still having this issue on Friday she could make an appt with a provider. Pt states that she would like to wait as long as possible before making an appt as she has family planning medicaid. Advised to call back with any other problems. Pt verbalized understanding.

## 2019-02-05 ENCOUNTER — Emergency Department (HOSPITAL_COMMUNITY): Payer: BLUE CROSS/BLUE SHIELD

## 2019-02-05 ENCOUNTER — Encounter (HOSPITAL_COMMUNITY): Payer: Self-pay | Admitting: Emergency Medicine

## 2019-02-05 ENCOUNTER — Emergency Department (HOSPITAL_COMMUNITY)
Admission: EM | Admit: 2019-02-05 | Discharge: 2019-02-05 | Disposition: A | Discharge status: Home / Self Care | Payer: BLUE CROSS/BLUE SHIELD | Attending: Emergency Medicine | Admitting: Emergency Medicine

## 2019-02-05 ENCOUNTER — Other Ambulatory Visit: Payer: Self-pay

## 2019-02-05 DIAGNOSIS — J111 Influenza due to unidentified influenza virus with other respiratory manifestations: Secondary | ICD-10-CM

## 2019-02-05 DIAGNOSIS — J069 Acute upper respiratory infection, unspecified: Secondary | ICD-10-CM

## 2019-02-05 DIAGNOSIS — R05 Cough: Secondary | ICD-10-CM | POA: Diagnosis present

## 2019-02-05 DIAGNOSIS — B9789 Other viral agents as the cause of diseases classified elsewhere: Secondary | ICD-10-CM

## 2019-02-05 DIAGNOSIS — Z87891 Personal history of nicotine dependence: Secondary | ICD-10-CM | POA: Insufficient documentation

## 2019-02-05 DIAGNOSIS — Z79899 Other long term (current) drug therapy: Secondary | ICD-10-CM | POA: Diagnosis not present

## 2019-02-05 DIAGNOSIS — R69 Illness, unspecified: Secondary | ICD-10-CM

## 2019-02-05 LAB — CBC WITH DIFFERENTIAL/PLATELET
Abs Immature Granulocytes: 0.02 10*3/uL (ref 0.00–0.07)
BASOS ABS: 0 10*3/uL (ref 0.0–0.1)
BASOS PCT: 0 %
EOS ABS: 0.1 10*3/uL (ref 0.0–0.5)
EOS PCT: 2 %
HCT: 40.5 % (ref 36.0–46.0)
Hemoglobin: 13 g/dL (ref 12.0–15.0)
IMMATURE GRANULOCYTES: 0 %
Lymphocytes Relative: 14 %
Lymphs Abs: 0.9 10*3/uL (ref 0.7–4.0)
MCH: 31.3 pg (ref 26.0–34.0)
MCHC: 32.1 g/dL (ref 30.0–36.0)
MCV: 97.4 fL (ref 80.0–100.0)
Monocytes Absolute: 0.5 10*3/uL (ref 0.1–1.0)
Monocytes Relative: 8 %
NEUTROS PCT: 76 %
NRBC: 0 % (ref 0.0–0.2)
Neutro Abs: 4.8 10*3/uL (ref 1.7–7.7)
PLATELETS: 241 10*3/uL (ref 150–400)
RBC: 4.16 MIL/uL (ref 3.87–5.11)
RDW: 12.5 % (ref 11.5–15.5)
WBC: 6.3 10*3/uL (ref 4.0–10.5)

## 2019-02-05 LAB — COMPREHENSIVE METABOLIC PANEL
ALBUMIN: 3.9 g/dL (ref 3.5–5.0)
ALT: 44 U/L (ref 0–44)
ANION GAP: 9 (ref 5–15)
AST: 37 U/L (ref 15–41)
Alkaline Phosphatase: 64 U/L (ref 38–126)
BUN: 10 mg/dL (ref 6–20)
CHLORIDE: 105 mmol/L (ref 98–111)
CO2: 24 mmol/L (ref 22–32)
Calcium: 9.1 mg/dL (ref 8.9–10.3)
Creatinine, Ser: 0.65 mg/dL (ref 0.44–1.00)
GFR calc Af Amer: >60 mL/min (ref >60)
Glucose, Bld: 93 mg/dL (ref 70–99)
POTASSIUM: 3.3 mmol/L — AB (ref 3.5–5.1)
Sodium: 138 mmol/L (ref 135–145)
TOTAL PROTEIN: 7.3 g/dL (ref 6.5–8.1)
Total Bilirubin: 0.5 mg/dL (ref 0.3–1.2)

## 2019-02-05 LAB — TROPONIN I

## 2019-02-05 LAB — HCG, QUANTITATIVE, PREGNANCY: hCG, Beta Chain, Quant, S: <1 m[IU]/mL (ref <5)

## 2019-02-05 LAB — INFLUENZA PANEL BY PCR (TYPE A & B)
INFLAPCR: POSITIVE — AB
INFLBPCR: NEGATIVE

## 2019-02-05 MED ORDER — BENZONATATE 200 MG PO CAPS: 200.0000 mg | ORAL_CAPSULE | Freq: Two times a day (BID) | ORAL | 0 refills | Status: DC | PRN

## 2019-02-05 MED ORDER — OSELTAMIVIR PHOSPHATE 75 MG PO CAPS: 75.0000 mg | ORAL_CAPSULE | Freq: Two times a day (BID) | ORAL | 0 refills | Status: DC

## 2019-02-05 MED ORDER — IBUPROFEN 400 MG PO TABS: 400.0000 mg | ORAL_TABLET | Freq: Four times a day (QID) | ORAL | 0 refills | Status: DC | PRN

## 2019-02-05 NOTE — Discharge Instructions (Signed)
We saw in the ER for cough, body aches and flulike symptoms. 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  As discussed there is no cure for flu.  Although we have given you a prescription for tamiflu, just be aware that treatment for influenza is typically supportive and you need to make sure that you drink plenty of fluid and take medications for fevers, pain, nausea etc.  Please return to the ER if your symptoms worsen; you have increased pain, fevers, chills, inability to keep any medications down, confusion. Otherwise see the outpatient doctor as requested.

## 2019-02-05 NOTE — ED Provider Notes (Signed)
[facility] EMERGENCY DEPARTMENT Provider Note   CSN: [id number] Arrival date & time: 02/05/19  4098    History   Chief Complaint Chief Complaint  Patient presents with  . Cough    HPI Olivia Fuller is a 33 y.o. female.     HPI  33 year old female comes in with chief complaint of cough, body aches, subjective fevers, chills.  Patient states that she started feeling unwell yesterday and woke up with feeling a lot worse today.  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  Her husband also had respiratory flu last week. She is having a cough with clear phlegm.  Patient denies any nausea, vomiting.  She denies any history of underlying lung disease or cardiovascular disease.  Past Medical History:  Diagnosis Date  . Anxiety   . Asthma   . DDD (degenerative disc disease)   . Scoliosis   . UTI (lower urinary tract infection)     Patient Active Problem List   Diagnosis Date Noted  . Chronic prescription opiate use 03/09/2017  . History of infertility 03/09/2017  . Family history of aortic stenosis 03/09/2017  . 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 History of shoulder dystocia in prior pregnancy 08/10/2016  . 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 Anxiety 11/11/2015  . ALLERGIC RHINITIS 11/24/2010  . ASTHMA 11/24/2010  . GERD 11/24/2010  . DYSPNEA 11/24/2010  . CHEST PAIN, ATYPICAL, HX OF 11/24/2010    Past Surgical History:  Procedure Laterality Date  . BACK SURGERY    . LUMBAR LAMINECTOMY/DECOMPRESSION MICRODISCECTOMY  07/06/2012   Procedure: LUMBAR LAMINECTOMY/DECOMPRESSION MICRODISCECTOMY 1 LEVEL;  Surgeon: Cristi Loron, MD;  Location: [facility];  Service: Neurosurgery;  Laterality: Left;  LEFT Lumbar Five-Sacral One diskectomy  . No Surgical History       OB History    Gravida  1   Para  1   Term  1   Preterm      AB      Living  1     SAB      TAB      Ectopic      Multiple  0   Live  Births  1            Home Medications    Prior to Admission medications   Medication Sig Start Date End Date Taking? Authorizing Provider  amphetamine-dextroamphetamine (ADDERALL) 20 MG tablet Take 10-20 mg by mouth 3 (three) times daily. Take 20 mg in the morning, 10 mg at lunch, and 10 mg at supper.    [provider]  benzonatate (TESSALON) 200 MG capsule Take 1 capsule (200 mg total) by mouth 2 (two) times daily as needed for cough. 02/05/19   Derwood Kaplan, MD  ibuprofen (ADVIL,MOTRIN) 400 MG tablet Take 1 tablet (400 mg total) by mouth every 6 (six) hours as needed for fever, headache, moderate pain or cramping. 02/05/19   Derwood Kaplan, MD  magic mouthwash w/lidocaine SOLN Take 10 mLs by mouth 4 (four) times daily as needed (throat pain). Note to pharmacy - equal parts diphendydramine, aluminum hydroxide and lidocaine HCL 11/12/17   Idol, Raynelle Fanning, PA-C  oseltamivir (TAMIFLU) 75 MG capsule Take 1 capsule (75 mg total) by mouth every 12 (twelve) hours. 02/05/19   Derwood Kaplan, MD  Oxycodone HCl 10 MG TABS Take 10 mg by mouth 3 (three) times daily.    [provider]    [facility]  History Family History  Problem Relation Age of Onset  . Seizures Mother   . Alcohol abuse Mother   . Drug abuse Mother   . Bipolar disorder Father   . Drug abuse Brother   . Alzheimer's disease Maternal Grandmother   . Lumbar disc disease Maternal Grandmother   . Cancer Paternal Grandmother        colon  . Hyperlipidemia Paternal Grandmother   . Hypertension Paternal Grandmother   . Cancer Paternal Grandfather        prostate, bone  . Diabetes Paternal Grandfather   . Heart disease Paternal Grandfather   . Other Daughter        acid reflux    Social History Social History   Tobacco Use  . Smoking status: Former Smoker    Years: 5.00    Last attempt to quit: 02/14/2012    Years since quitting: 6.9  . Smokeless tobacco: Former Engineer, water Use Topics  . Alcohol use: No    . Drug use: No    Types: Marijuana     Allergies   Tylenol [acetaminophen] and Zofran [ondansetron hcl]   Review of Systems Review of Systems  Constitutional: Positive for activity change.  HENT: Positive for congestion.   Respiratory: Positive for cough. Negative for shortness of breath and wheezing.   Cardiovascular: Negative for chest pain.  Musculoskeletal: Positive for myalgias.  Skin: Negative for pallor.     Physical Exam Updated Vital Signs BP 130/69 (BP Location: Right Arm)   Pulse (!) 132   Temp 99 F (37.2 C) (Oral)   Resp 20   Ht 5\' 7"  (1.702 m)   Wt 101.2 kg   LMP 01/16/2019   SpO2 96%   BMI 34.93 kg/m   Physical Exam Vitals signs and nursing note reviewed.  Constitutional:      Appearance: She is well-developed.  HENT:     Head: Normocephalic and atraumatic.  Eyes:     Pupils: Pupils are equal, round, and reactive to light.  Neck:     Musculoskeletal: Neck supple.  Cardiovascular:     Rate and Rhythm: Regular rhythm. Tachycardia present.     Heart sounds: Normal heart sounds. No murmur.  Pulmonary:     Effort: Pulmonary effort is normal. No respiratory distress.     Breath sounds: No wheezing or rhonchi.  Abdominal:     General: There is no distension.     Palpations: Abdomen is soft.     Tenderness: There is no abdominal tenderness. There is no guarding or rebound.  Skin:    General: Skin is warm and dry.  Neurological:     Mental Status: She is alert and oriented to person, place, and time.      ED Treatments / Results  Labs (all labs ordered are listed, but only abnormal results are displayed) Labs Reviewed  COMPREHENSIVE METABOLIC PANEL - Abnormal; Notable for the following components:      Result Value   Potassium 3.3 (*)    All other components within normal limits  CBC WITH DIFFERENTIAL/PLATELET  TROPONIN I  HCG, QUANTITATIVE, PREGNANCY  INFLUENZA PANEL BY PCR (TYPE A & B)    EKG None  Radiology Dg Chest 2  View  Result Date: 02/05/2019 CLINICAL DATA:  Generalized body aches, dry clear productive cough, and sore throat started last night. -- dizziness and generalized weakness as well with a high heart rate. EXAM: CHEST - 2 VIEW COMPARISON:  03/03/2015 FINDINGS: Heart size is  normal. There is mild perihilar peribronchial thickening. There are no focal consolidations or pleural effusions. No pulmonary edema. Mild convex RIGHT scoliosis of the LOWER thoracic spine. IMPRESSION: 1. Bronchitic changes. 2. No focal acute pulmonary abnormality. Electronically Signed   By: Norva PavlovElizabeth  Brown M.D.   On: 02/05/2019 10:56    Procedures Procedures (including critical care time)  Medications Ordered in ED Medications - No data to display   Initial Impression / Assessment and Plan / ED Course  I have reviewed the triage vital signs and the nursing notes.  Pertinent labs & imaging results that were available during my care of the patient were reviewed by me and considered in my medical decision making (see chart for details).        33 year old female comes to the ER with chief complaint of flulike symptoms.  She is noted to be tachycardic.  Her lung exam and cardiovascular exam otherwise is unremarkable.  Patient has no underlying high risk features that would put her at high risk of complications from influenza.  Given that she did have flu exposure and she works at a side with seniors, we will screen her for influenza.  Patient is requesting Tamiflu prescription which will be provided.  Strict ER return precautions have been discussed, and patient is agreeing with the plan and is comfortable with the workup done and the recommendations from the ER.   Final Clinical Impressions(s) / ED Diagnoses   Final diagnoses:  Influenza-like illness  Viral URI with cough    ED Discharge Orders         Ordered    oseltamivir (TAMIFLU) 75 MG capsule  Every 12 hours     02/05/19 1255    benzonatate (TESSALON) 200  MG capsule  2 times daily PRN     02/05/19 1255    ibuprofen (ADVIL,MOTRIN) 400 MG tablet  Every 6 hours PRN     02/05/19 1255           Derwood KaplanNanavati, Lawerance Matsuo, MD 02/05/19 1301

## 2019-02-05 NOTE — ED Triage Notes (Addendum)
PT c/o generalized body aches, dry clear productive cough, and sore throat started last night. 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 PT c/o dizziness and generalized weakness as well with a high heart rate. PT denies any medications to treat symptoms.

## 2019-12-30 IMAGING — DX DG CHEST 2V
2 series · 2 of 2 positions shown · non-contrast
Comparison: 03/03/2015

CLINICAL DATA: Generalized body aches, dry clear productive cough,
and sore throat started last night. -- dizziness and generalized
weakness as well with a high heart rate.

EXAM:
CHEST - 2 VIEW

[chest pa]
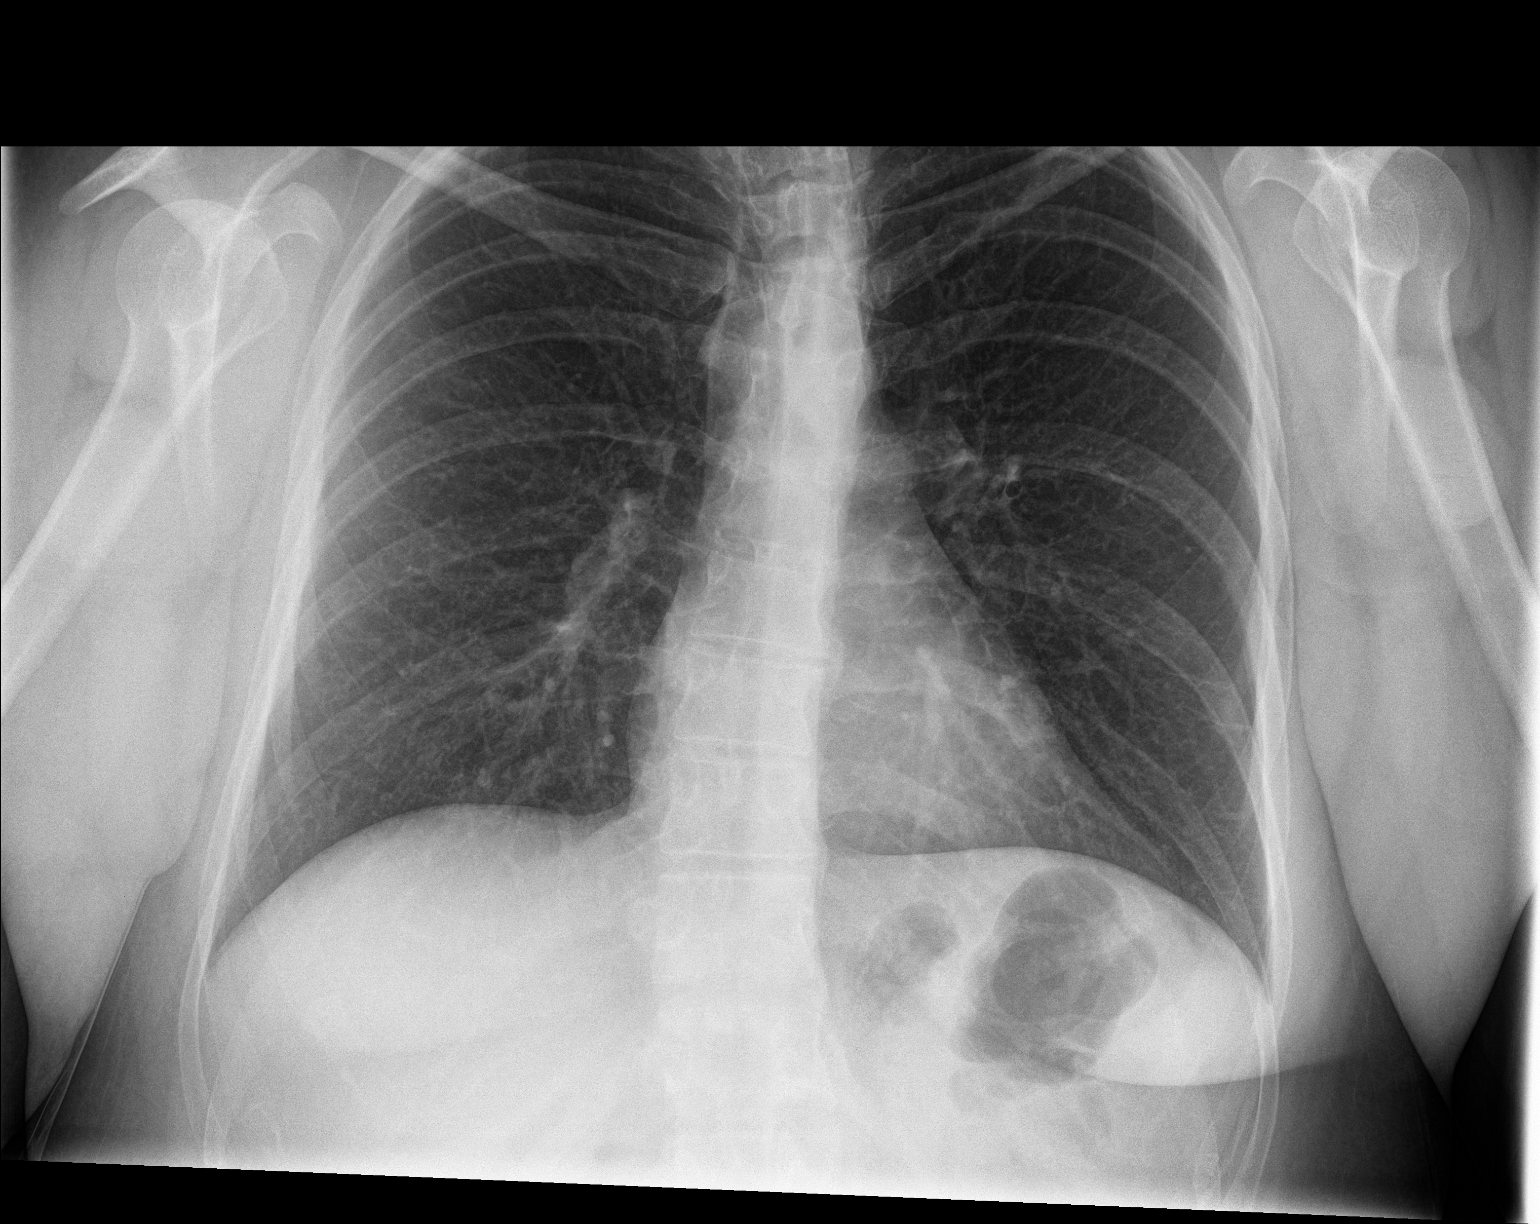

[chest lat]
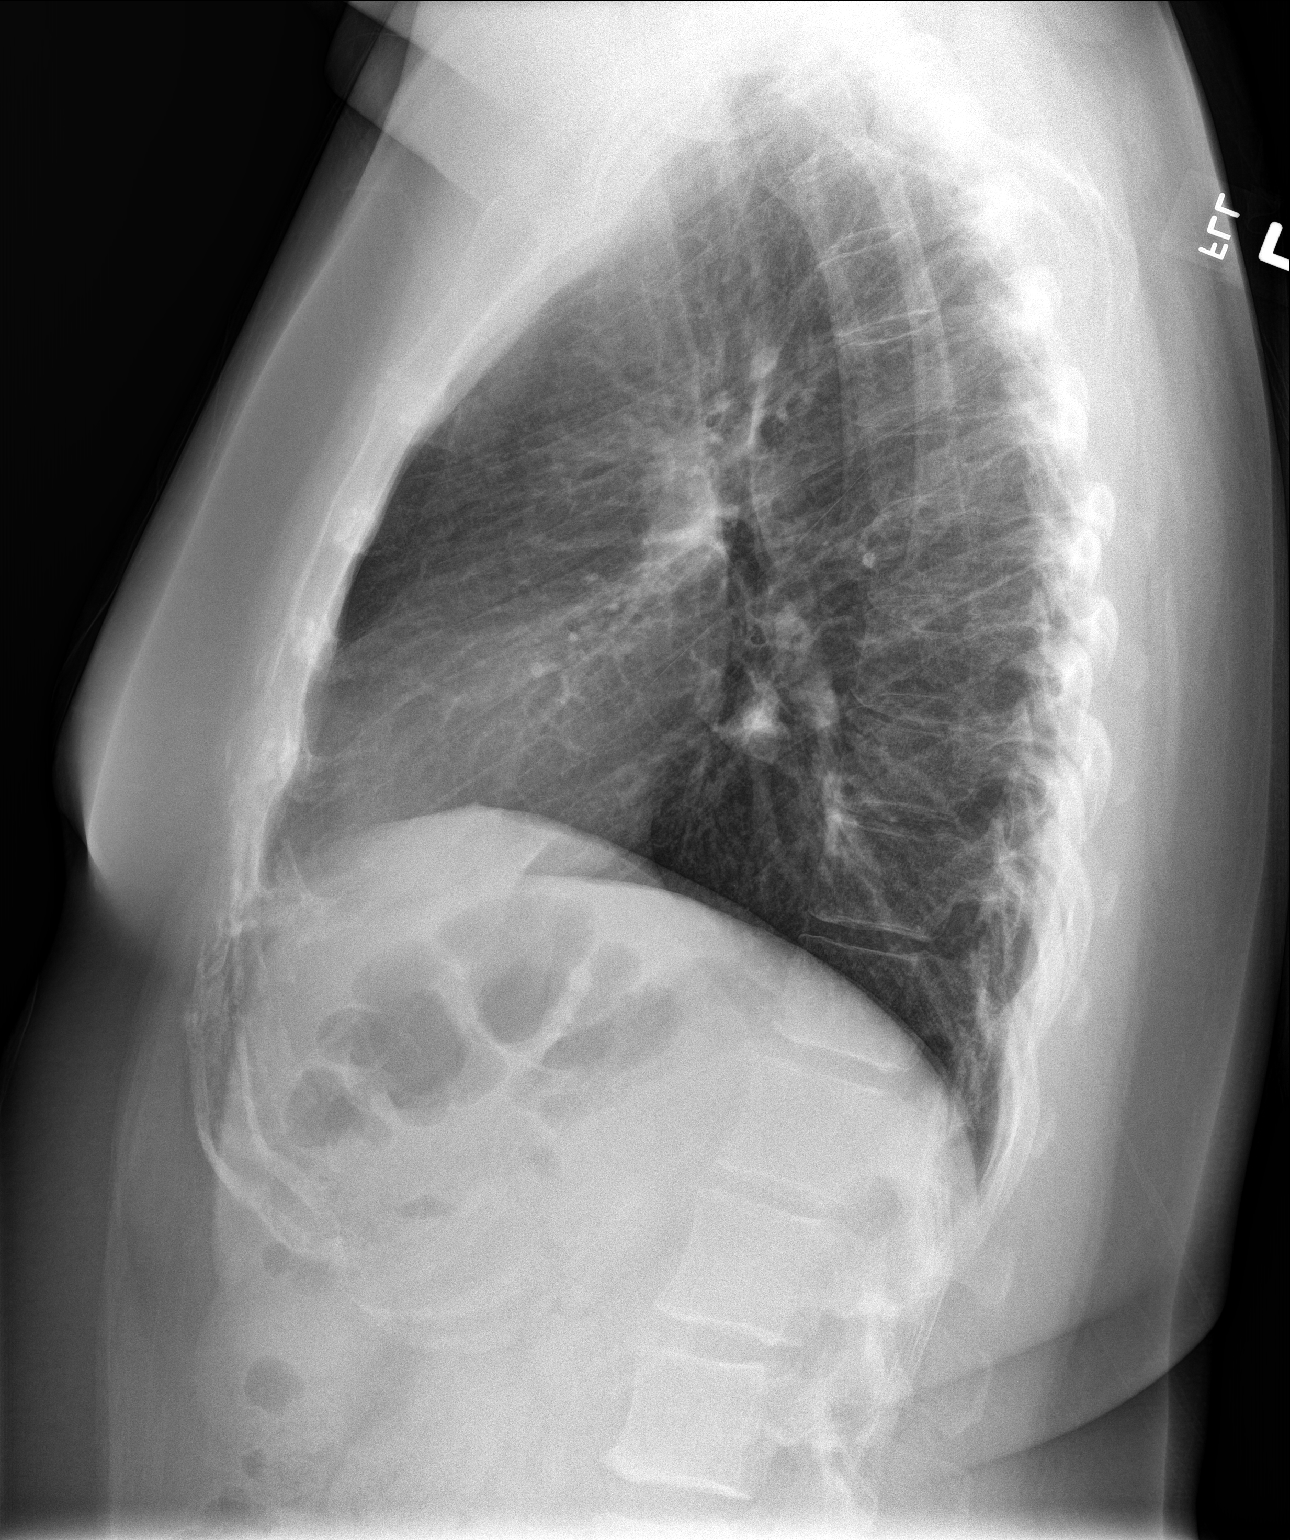

[2 of 2 positions shown; findings below may reference images not displayed]

FINDINGS: Heart size is normal. There is mild perihilar peribronchial
thickening. There are no focal consolidations or pleural effusions.
No pulmonary edema. Mild convex RIGHT scoliosis of the LOWER
thoracic spine.
IMPRESSION: 1. Bronchitic changes.
2. No focal acute pulmonary abnormality.

## 2020-04-01 ENCOUNTER — Other Ambulatory Visit: Payer: Self-pay

## 2020-04-01 ENCOUNTER — Ambulatory Visit
Admission: EM | Admit: 2020-04-01 | Discharge: 2020-04-01 | Disposition: A | Discharge status: Home / Self Care | Payer: 59

## 2020-04-01 DIAGNOSIS — J069 Acute upper respiratory infection, unspecified: Secondary | ICD-10-CM

## 2020-04-01 DIAGNOSIS — Z20822 Contact with and (suspected) exposure to covid-19: Secondary | ICD-10-CM

## 2020-04-01 HISTORY — DX: Pure hypercholesterolemia, unspecified: E78.00

## 2020-04-01 LAB — POC SARS CORONAVIRUS 2 AG -  ED: SARS Coronavirus 2 Ag: NEGATIVE

## 2020-04-01 MED ORDER — ALBUTEROL SULFATE HFA 108 (90 BASE) MCG/ACT IN AERS: 1.0000 | INHALATION_SPRAY | Freq: Four times a day (QID) | RESPIRATORY_TRACT | 0 refills | Status: DC | PRN

## 2020-04-01 MED ORDER — BENZONATATE 100 MG PO CAPS
100.0000 mg | ORAL_CAPSULE | Freq: Three times a day (TID) | ORAL | 0 refills | Status: DC
Start: 2020-04-01 — End: 2020-08-07

## 2020-04-01 MED ORDER — FLUTICASONE PROPIONATE 50 MCG/ACT NA SUSP
2.0000 | Freq: Every day | NASAL | 0 refills | Status: DC
Start: 2020-04-01 — End: 2020-08-07

## 2020-04-01 MED ORDER — CETIRIZINE HCL 10 MG PO TABS: 10.0000 mg | ORAL_TABLET | Freq: Every day | ORAL | 0 refills | Status: DC

## 2020-04-01 NOTE — ED Triage Notes (Signed)
Pt reports cough, chest tightness/pressure, runny nose and sob x 2 days.  Denies fever.

## 2020-04-01 NOTE — Discharge Instructions (Signed)
Rapid COVID negative.  Culture sent.  Patient should remain in quarantine until they have received culture results.  If negative you may resume normal activities (go back to work/school) while practicing hand hygiene, social distance, and mask wearing.  If positive, patient should remain in quarantine for 10 days from symptom onset AND greater than 72 hours after symptoms resolution (absence of fever without the use of fever-reducing medication and improvement in respiratory symptoms), whichever is longer Get plenty of rest and push fluids Tessalon for cough Albuterol inhaler for chest tightness Use OTC zyrtec for nasal congestion, runny nose, and/or sore throat Use OTC flonase for nasal congestion and runny nose Use medications daily for symptom relief Use OTC medications like ibuprofen or tylenol as needed fever or pain Call or go to the ED if you have any new or worsening symptoms such as fever, worsening cough, shortness of breath, chest tightness, chest pain, turning blue, changes in mental status, etc..Marland Kitchen

## 2020-04-01 NOTE — ED Provider Notes (Signed)
[place]   [id number] 04/01/20 Arrival Time: 3009   CC: COVID symptoms  SUBJECTIVE: History from: patient.  Olivia Fuller is a 34 y.o. female who presents with mild productive cough, chest tightness/ pressure, runny nose and SOB x 2 days.  Denies sick exposure to COVID, flu or strep.  Does admit to recent travel to the beach.  Denies alleviating factors.  Symptoms are made worse at night and with smoking.  Denies previous symptoms in the past.   Denies fever, chills, SOB, wheezing, chest pain, nausea, changes in bowel or bladder habits.    ROS: As per HPI.  All other pertinent ROS negative.     Past Medical History:  Diagnosis Date  . Anxiety   . Asthma   . DDD (degenerative disc disease)   . Hypercholesteremia   . Scoliosis   . UTI (lower urinary tract infection)    Past Surgical History:  Procedure Laterality Date  . BACK SURGERY    . LUMBAR LAMINECTOMY/DECOMPRESSION MICRODISCECTOMY  07/06/2012   Procedure: LUMBAR LAMINECTOMY/DECOMPRESSION MICRODISCECTOMY 1 LEVEL;  Surgeon: Ophelia Charter, MD;  Location: [facility];  Service: Neurosurgery;  Laterality: Left;  LEFT Lumbar Five-Sacral One diskectomy  . No Surgical History     Allergies  Allergen Reactions  . Tylenol [Acetaminophen] Itching and Nausea And Vomiting  . Zofran [Ondansetron Hcl] Hives and Itching   No current facility-administered medications on file prior to encounter.   Current Outpatient Medications on File Prior to Encounter  Medication Sig Dispense Refill  . amphetamine-dextroamphetamine (ADDERALL) 20 MG tablet Take 10-20 mg by mouth 3 (three) times daily. Take 20 mg in the morning, 10 mg at lunch, and 10 mg at supper.    . cyclobenzaprine (FLEXERIL) 10 MG tablet Take 10 mg by mouth 3 (three) times daily as needed for muscle spasms.    . Oxycodone HCl 10 MG TABS Take 10 mg by mouth 3 (three) times daily.    . pravastatin (PRAVACHOL) 40 MG tablet Take 40 mg by mouth daily.    Marland Kitchen ibuprofen  (ADVIL,MOTRIN) 400 MG tablet Take 1 tablet (400 mg total) by mouth every 6 (six) hours as needed for fever, headache, moderate pain or cramping. 30 tablet 0   Social History   Socioeconomic History  . Marital status: Single    Spouse name: Not on file  . Number of children: Not on file  . Years of education: Not on file  . Highest education level: Not on file  Occupational History  . Not on file  Tobacco Use  . Smoking status: Former Smoker    Years: 5.00    Quit date: 02/14/2012    Years since quitting: 8.1  . Smokeless tobacco: Former Network engineer and Sexual Activity  . Alcohol use: No  . Drug use: No    Types: Marijuana  . Sexual activity: Yes    Birth control/protection: None  Other Topics Concern  . Not on file  Social History Narrative  . Not on file   Social Determinants of Health   Financial Resource Strain:   . Difficulty of Paying Living Expenses:   Food Insecurity:   . Worried About Charity fundraiser in the Last Year:   . Arboriculturist in the Last Year:   Transportation Needs:   . Film/video editor (Medical):   Marland Kitchen Lack of Transportation (Non-Medical):   Physical Activity:   . Days of Exercise per Week:   . Minutes  of Exercise per Session:   Stress:   . Feeling of Stress :   Social Connections:   . Frequency of Communication with Friends and Family:   . Frequency of Social Gatherings with Friends and Family:   . Attends Religious Services:   . Active Member of Clubs or Organizations:   . Attends Archivist Meetings:   Marland Kitchen Marital Status:   Intimate Partner Violence:   . Fear of Current or Ex-Partner:   . Emotionally Abused:   Marland Kitchen Physically Abused:   . Sexually Abused:    Family History  Problem Relation Age of Onset  . Seizures Mother   . Alcohol abuse Mother   . Drug abuse Mother   . Bipolar disorder Father   . Drug abuse Brother   . Alzheimer's disease Maternal Grandmother   . Lumbar disc disease Maternal Grandmother   .  Cancer Paternal Grandmother        colon  . Hyperlipidemia Paternal Grandmother   . Hypertension Paternal Grandmother   . Cancer Paternal Grandfather        prostate, bone  . Diabetes Paternal Grandfather   . Heart disease Paternal Grandfather   . Other Daughter        acid reflux    OBJECTIVE:  Vitals:   04/01/20 0852 04/01/20 0853  BP: 99/70   Pulse: (!) 116   Resp: 20   Temp: 98.8 F (37.1 C)   TempSrc: Oral   SpO2: 93%   Weight:  205 lb (93 kg)  Height:  '5\' 7"'  (1.702 m)     General appearance: alert; appears fatigued, but nontoxic; speaking in full sentences and tolerating own secretions HEENT: NCAT; Ears: EACs clear, TMs pearly gray; Eyes: PERRL.  EOM grossly intact.  Nose: nares patent with clear rhinorrhea, Throat: oropharynx clear, tonsils non erythematous or enlarged, uvula midline  Neck: supple without LAD Lungs: unlabored respirations, symmetrical air entry; cough: absent; no respiratory distress; CTAB Heart: Tachycardic Skin: warm and dry Psychological: alert and cooperative; normal mood and affect  LABS:  Results for orders placed or performed during the hospital encounter of 04/01/20 (from the past 24 hour(s))  POC SARS Coronavirus 2 Ag-ED - Nasal Swab (BD Veritor Kit)     Status: None   Collection Time: 04/01/20  9:14 AM  Result Value Ref Range   SARS Coronavirus 2 Ag Negative Negative     ASSESSMENT & PLAN:  1. Viral URI with cough   2. Suspected COVID-19 virus infection     Meds ordered this encounter  Medications  . cetirizine (ZYRTEC) 10 MG tablet    Sig: Take 1 tablet (10 mg total) by mouth daily.    Dispense:  30 tablet    Refill:  0    Order Specific Question:   Supervising Provider    Answer:   Raylene Everts [[id number]]  . fluticasone (FLONASE) 50 MCG/ACT nasal spray    Sig: Place 2 sprays into both nostrils daily.    Dispense:  16 g    Refill:  0    Order Specific Question:   Supervising Provider    Answer:   Raylene Everts  [[id number]]  . benzonatate (TESSALON) 100 MG capsule    Sig: Take 1 capsule (100 mg total) by mouth every 8 (eight) hours.    Dispense:  21 capsule    Refill:  0    Order Specific Question:   Supervising Provider    Answer:   Blanchie Serve  SUE [[id number]]  . albuterol (VENTOLIN HFA) 108 (90 Base) MCG/ACT inhaler    Sig: Inhale 1-2 puffs into the lungs every 6 (six) hours as needed for wheezing or shortness of breath.    Dispense:  18 g    Refill:  0    Order Specific Question:   Supervising Provider    Answer:   Raylene Everts [[id number]]   Discussed further evaluation for concerning symptoms of chest pressure and SOB in the ED.  Patient declines at this time and would like to try outpatient therapy.  Discussed missed diagnosis of cardiac cause and/or blood clots.  Patient aware and would like to proceed.  Given strict ED precautions if symptoms do not improve over the course of the day with prescribed medications.    Rapid COVID negative.  Culture sent.  Patient should remain in quarantine until they have received culture results.  If negative you may resume normal activities (go back to work/school) while practicing hand hygiene, social distance, and mask wearing.  If positive, patient should remain in quarantine for 10 days from symptom onset AND greater than 72 hours after symptoms resolution (absence of fever without the use of fever-reducing medication and improvement in respiratory symptoms), whichever is longer Get plenty of rest and push fluids Tessalon for cough Albuterol inhaler for chest tightness Use OTC zyrtec for nasal congestion, runny nose, and/or sore throat Use OTC flonase for nasal congestion and runny nose Use medications daily for symptom relief Use OTC medications like ibuprofen or tylenol as needed fever or pain Call or go to the ED if you have any new or worsening symptoms such as fever, worsening cough, shortness of breath, chest tightness, chest pain, turning blue,  changes in mental status, etc...    Reviewed expectations re: course of current medical issues. Questions answered. Outlined signs and symptoms indicating need for more acute intervention. Patient verbalized understanding. After Visit Summary given.         Lestine Box, PA-C 04/01/20 [phone]

## 2020-04-02 LAB — NOVEL CORONAVIRUS, NAA: SARS-CoV-2, NAA: NOT DETECTED

## 2020-04-02 LAB — SARS-COV-2, NAA 2 DAY TAT

## 2020-08-07 ENCOUNTER — Other Ambulatory Visit (HOSPITAL_COMMUNITY)
Admission: RE | Admit: 2020-08-07 | Discharge: 2020-08-07 | Disposition: A | Discharge status: Home / Self Care | Payer: 59 | Source: Ambulatory Visit | Attending: Obstetrics & Gynecology | Admitting: Obstetrics & Gynecology

## 2020-08-07 ENCOUNTER — Ambulatory Visit (INDEPENDENT_AMBULATORY_CARE_PROVIDER_SITE_OTHER): Payer: 59 | Admitting: Women's Health

## 2020-08-07 ENCOUNTER — Encounter: Payer: Self-pay | Admitting: Women's Health

## 2020-08-07 VITALS — BP 116/82 | HR 95 | Ht 67.0 in | Wt 192.0 lb

## 2020-08-07 DIAGNOSIS — N979 Female infertility, unspecified: Secondary | ICD-10-CM

## 2020-08-07 DIAGNOSIS — Z01419 Encounter for gynecological examination (general) (routine) without abnormal findings: Secondary | ICD-10-CM | POA: Diagnosis not present

## 2020-08-07 NOTE — Progress Notes (Signed)
   WELL-WOMAN EXAMINATION Patient name: Olivia Fuller MRN [id number]  Date of birth: May 27, 1986 Chief Complaint:   Gynecologic Exam  History of Present Illness:   Olivia Fuller is a 34 y.o. G56P1001 Caucasian female being seen today for a routine well-woman exam.  Current complaints: infertility, trying x 42yrs, scheduling appt w/ [facility] but has to get up to date on pap 1st  Depression screen [facility] 2/9 08/07/2020  Decreased Interest 0  Down, Depressed, Hopeless 0  PHQ - 2 Score 0  Altered sleeping 0  Tired, decreased energy 0  Change in appetite 0  Feeling bad or failure about yourself  0  Trouble concentrating 0  Moving slowly or fidgety/restless 0  Suicidal thoughts 0  PHQ-9 Score 0     PCP: Delbert Harness      does not desire labs Patient's last menstrual period was 07/18/2020. The current method of family planning is none.  Last pap 03/09/17. Results were: normal. H/O abnormal pap: yes Last mammogram: never. Results were: N/A. Family h/o breast cancer: no Last colonoscopy: never. Results were: N/A. Family h/o colorectal cancer: yes PGM Review of Systems:   Pertinent items are noted in HPI Denies any headaches, blurred vision, fatigue, shortness of breath, chest pain, abdominal pain, abnormal vaginal discharge/itching/odor/irritation, problems with periods, bowel movements, urination, or intercourse unless otherwise stated above. Pertinent History Reviewed:  Reviewed past medical,surgical, social and family history.  Reviewed problem list, medications and allergies. Physical Assessment:   Vitals:   08/07/20 0833  BP: 116/82  Pulse: 95  Weight: 192 lb (87.1 kg)  Height: 5\' 7"  (1.702 m)  Body mass index is 30.07 kg/m.        Physical Examination:   General appearance - well appearing, and in no distress  Mental status - alert, oriented to person, place, and time  Psych:  She has a normal mood and affect  Skin - warm and dry, normal color, no suspicious lesions  noted  Chest - effort normal, all lung fields clear to auscultation bilaterally  Heart - normal rate and regular rhythm  Neck:  midline trachea, no thyromegaly or nodules  Breasts - breasts appear normal, no suspicious masses, no skin or nipple changes or  axillary nodes  Abdomen - soft, nontender, nondistended, no masses or organomegaly  Pelvic - VULVA: normal appearing vulva with no masses, tenderness or lesions  VAGINA: normal appearing vagina with normal color and discharge, no lesions  CERVIX: normal appearing cervix without discharge or lesions, no CMT  Thin prep pap is done w/ HR HPV cotesting  UTERUS: uterus is felt to be normal size, shape, consistency and nontender   ADNEXA: No adnexal masses or tenderness noted.  Extremities:  No swelling or varicosities noted  Chaperone: Amanda Rash    No results found for this or any previous visit (from the past 24 hour(s)).  Assessment & Plan:  1) Well-Woman Exam  2) Infertility>scheduling appt w/ Fertility as soon as pap is back  Labs/procedures today: pap  Mammogram @34yo  or sooner if problems Colonoscopy @34yo  or sooner if problems  No orders of the defined types were placed in this encounter.   Meds: No orders of the defined types were placed in this encounter.   Follow-up: Return in about 1 year (around 08/07/2021) for Physical.  CNM, WHNP-BC 08/07/2020 9:09 AM

## 2020-08-08 LAB — CYTOLOGY - PAP
Chlamydia: NEGATIVE
Comment: NEGATIVE
Comment: NEGATIVE
Comment: NORMAL
Diagnosis: NEGATIVE
High risk HPV: NEGATIVE
Neisseria Gonorrhea: NEGATIVE

## 2020-11-13 DIAGNOSIS — Z79899 Other long term (current) drug therapy: Secondary | ICD-10-CM | POA: Diagnosis not present

## 2021-01-14 DIAGNOSIS — Z79899 Other long term (current) drug therapy: Secondary | ICD-10-CM | POA: Diagnosis not present

## 2021-02-03 DIAGNOSIS — J45909 Unspecified asthma, uncomplicated: Secondary | ICD-10-CM | POA: Diagnosis not present

## 2021-02-03 DIAGNOSIS — E7849 Other hyperlipidemia: Secondary | ICD-10-CM | POA: Diagnosis not present

## 2021-03-13 DIAGNOSIS — Z79899 Other long term (current) drug therapy: Secondary | ICD-10-CM | POA: Diagnosis not present

## 2021-03-31 DIAGNOSIS — J45909 Unspecified asthma, uncomplicated: Secondary | ICD-10-CM | POA: Diagnosis not present

## 2021-03-31 DIAGNOSIS — E7849 Other hyperlipidemia: Secondary | ICD-10-CM | POA: Diagnosis not present

## 2021-04-13 DIAGNOSIS — D510 Vitamin B12 deficiency anemia due to intrinsic factor deficiency: Secondary | ICD-10-CM | POA: Diagnosis not present

## 2021-04-13 DIAGNOSIS — R5383 Other fatigue: Secondary | ICD-10-CM | POA: Diagnosis not present

## 2021-04-13 DIAGNOSIS — E559 Vitamin D deficiency, unspecified: Secondary | ICD-10-CM | POA: Diagnosis not present

## 2021-04-13 DIAGNOSIS — E7849 Other hyperlipidemia: Secondary | ICD-10-CM | POA: Diagnosis not present

## 2021-04-13 DIAGNOSIS — I110 Hypertensive heart disease with heart failure: Secondary | ICD-10-CM | POA: Diagnosis not present

## 2021-04-30 ENCOUNTER — Encounter: Payer: Self-pay | Admitting: Orthopedic Surgery

## 2021-04-30 ENCOUNTER — Other Ambulatory Visit: Payer: Self-pay

## 2021-04-30 ENCOUNTER — Ambulatory Visit: Payer: Medicaid Other | Admitting: Orthopedic Surgery

## 2021-04-30 VITALS — BP 136/94 | HR 119 | Ht 67.0 in | Wt 217.0 lb

## 2021-04-30 DIAGNOSIS — R202 Paresthesia of skin: Secondary | ICD-10-CM

## 2021-04-30 NOTE — Progress Notes (Signed)
  Chief Complaint  Patient presents with  . Hand Problem    Bilateral hand pain/ numbness     35 years old referred by [facility] medical for numbness and tingling in both hands.  Symptoms started 6 months ago started on the ulnar side of the wrist and radiated radially it affects both upper extremities she notices numbness when she is driving  She did try some bracing it made it worse she also tried Lyrica and gabapentin and increase in Topamax none of that helped  She has not had a nerve test done at this time  Past Medical History:  Diagnosis Date  . Anxiety   . Asthma   . DDD (degenerative disc disease)   . Hypercholesteremia   . Scoliosis   . UTI (lower urinary tract infection)   \\  Physical Exam HENT:     Head: Normocephalic and atraumatic.  Musculoskeletal:     Comments: Skin right and left hand normal, decreased sensation in the fingers, grip strength normal range of motion normal no swelling  Skin:    General: Skin is dry.     Capillary Refill: Capillary refill takes less than 2 seconds.  Neurological:     General: No focal deficit present.     Mental Status: She is alert and oriented to person, place, and time.  Psychiatric:        Attention and Perception: Attention and perception normal.     Comments: Speech seemed to be subtly slurred    Mild tenderness over the cubital tunnel and elbow but no evidence of Tinel's.  Sensation loss is really globally in the fingers of both hands.  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

## 2021-04-30 NOTE — Patient Instructions (Addendum)
Driving Directions to [employer] from [employer] address is [address] [place] The phone number is [phone]  Dr Alvester Morin is the Doctor who will see you for nerve study, his office will call you with appointment  1. Start out going [place] on [address] toward [address].  Then 0.02 miles0.02 total miles 2. Take the 1st right onto [address]/[place]. Continue to follow [address].  If you reach [facility] you've gone a little too far  Then 0.58 miles0.60 total miles 3. Turn right onto [address].  [place] is just past [address]  Then 2.25 miles2.85 total miles 4. Take the [address] ramp toward [place].  Then 0.25 miles3.10 total miles 5. Merge onto [address]  Then 18.17 miles21.28 total miles 6. Merge onto [address]  Then 1.47 miles22.74 total miles 7. Turn right onto [place].  [address] is just past [address]  Then 0.11 miles22.85 total miles  8. [address] [place], [place] [address], [phone] is on the left.  Carpal Tunnel Syndrome  Carpal tunnel syndrome is a condition that causes pain, weakness, and numbness in your hand and arm. Numbness is when you cannot feel an area in your body. The carpal tunnel is a narrow area that is on the palm side of your wrist. Repeated wrist motion or certain diseases may cause swelling in the tunnel. This swelling can pinch the main nerve in the wrist. This nerve is called the median nerve. What are the causes? This condition may be caused by:  Moving your hand and wrist over and over again while doing a task.  Injury to the wrist.  Arthritis.  A sac of fluid (cyst) or abnormal growth (tumor) in the carpal tunnel.  Fluid buildup during pregnancy.  Use of tools that vibrate. Sometimes the cause is not known. What increases the risk? The following factors may make you more likely to have this condition:  Having  a job that makes you do these things: ? Move your hand over and over again. ? Work with tools that vibrate, such as drills or sanders.  Being a woman.  Having diabetes, obesity, thyroid problems, or kidney failure. What are the signs or symptoms? Symptoms of this condition include:  A tingling feeling in your fingers.  Tingling or loss of feeling in your hand.  Pain in your entire arm. This pain may get worse when you bend your wrist and elbow for a long time.  Pain in your wrist that goes up your arm to your shoulder.  Pain that goes down into your palm or fingers.  Weakness in your hands. You may find it hard to grab and hold items. You may feel worse at night. How is this treated? This condition may be treated with:  Lifestyle changes. You will be asked to stop or change the activity that caused your problem.  Doing exercises and activities that make bones, muscles, and tendons stronger (physical therapy).  Learning how to use your hand again (occupational therapy).  Medicines for pain and swelling. You may have injections in your wrist.  A wrist splint or brace.  Surgery. Follow these instructions at home: If you have a splint or brace:  Wear the splint or brace as told by your doctor. Take it off only as told by your doctor.  Loosen the splint if your fingers: ? Tingle. ?  Become numb. ? Turn cold and blue.  Keep the splint or brace clean.  If the splint or brace is not waterproof: ? Do not let it get wet. ? Cover it with a watertight covering when you take a bath or a shower. Managing pain, stiffness, and swelling If told, put ice on the painful area:  If you have a removable splint or brace, remove it as told by your doctor.  Put ice in a plastic bag.  Place a towel between your skin and the bag.  Leave the ice on for 20 minutes, 2-3 times per day. Do not fall asleep with the cold pack on your skin.  Take off the ice if your skin turns bright red.  This is very important. If you cannot feel pain, heat, or cold, you have a greater risk of damage to the area. Move your fingers often to reduce stiffness and swelling.   General instructions  Take over-the-counter and prescription medicines only as told by your doctor.  Rest your wrist from any activity that may cause pain. If needed, talk with your boss at work about changes that can help your wrist heal.  Do exercises as told by your doctor, physical therapist, or occupational therapist.  Keep all follow-up visits. Contact a doctor if:  You have new symptoms.  Medicine does not help your pain.  Your symptoms get worse. Get help right away if:  You have very bad numbness or tingling in your wrist or hand. Summary  Carpal tunnel syndrome is a condition that causes pain in your hand and arm.  It is often caused by repeated wrist motions.  Lifestyle changes and medicines are used to treat this problem. Surgery may help in very bad cases.  Follow your doctor's instructions about wearing a splint, resting your wrist, keeping follow-up visits, and calling for help. This information is not intended to replace advice given to you by your health care provider. Make sure you discuss any questions you have with your health care provider. Document Revised: 04/03/2020 Document Reviewed: 04/03/2020 Elsevier Patient Education  2021 [employer].

## 2021-05-12 DIAGNOSIS — Z79899 Other long term (current) drug therapy: Secondary | ICD-10-CM | POA: Diagnosis not present

## 2021-05-12 DIAGNOSIS — Z6833 Body mass index (BMI) 33.0-33.9, adult: Secondary | ICD-10-CM | POA: Diagnosis not present

## 2021-05-12 DIAGNOSIS — M47816 Spondylosis without myelopathy or radiculopathy, lumbar region: Secondary | ICD-10-CM | POA: Diagnosis not present

## 2021-05-12 DIAGNOSIS — R030 Elevated blood-pressure reading, without diagnosis of hypertension: Secondary | ICD-10-CM | POA: Diagnosis not present

## 2021-05-12 DIAGNOSIS — G894 Chronic pain syndrome: Secondary | ICD-10-CM | POA: Diagnosis not present

## 2021-06-01 ENCOUNTER — Other Ambulatory Visit (INDEPENDENT_AMBULATORY_CARE_PROVIDER_SITE_OTHER): Payer: Self-pay | Admitting: Internal Medicine

## 2021-06-10 ENCOUNTER — Other Ambulatory Visit (INDEPENDENT_AMBULATORY_CARE_PROVIDER_SITE_OTHER): Payer: Medicaid Other | Admitting: *Deleted

## 2021-06-10 ENCOUNTER — Other Ambulatory Visit (HOSPITAL_COMMUNITY)
Admission: RE | Admit: 2021-06-10 | Discharge: 2021-06-10 | Disposition: A | Discharge status: Home / Self Care | Payer: Medicaid Other | Source: Ambulatory Visit | Attending: Obstetrics & Gynecology | Admitting: Obstetrics & Gynecology

## 2021-06-10 ENCOUNTER — Other Ambulatory Visit: Payer: Self-pay

## 2021-06-10 DIAGNOSIS — Z113 Encounter for screening for infections with a predominantly sexual mode of transmission: Secondary | ICD-10-CM | POA: Diagnosis not present

## 2021-06-10 NOTE — Progress Notes (Signed)
Chart reviewed for nurse visit. Agree with plan of care.  Adline Potter, NP 06/10/2021 11:08 AM

## 2021-06-10 NOTE — Progress Notes (Signed)
   NURSE VISIT- STD  SUBJECTIVE:  Olivia Fuller is a 35 y.o. G1P1001 GYN patientfemale here for a vaginal swab for STD screen.  She reports the following symptoms: none for 0 days. Denies abnormal vaginal bleeding, significant pelvic pain, fever, or UTI symptoms.  OBJECTIVE:  There were no vitals taken for this visit.  Appears well, in no apparent distress  ASSESSMENT: Vaginal swab for STD screen  PLAN: Self-collected vaginal probe for Gonorrhea, Chlamydia, Trichomonas sent to lab Treatment: to be determined once results are received Follow-up as needed if symptoms persist/worsen, or new symptoms develop  Malachy Mood  06/10/2021 10:41 AM

## 2021-06-11 DIAGNOSIS — Z79899 Other long term (current) drug therapy: Secondary | ICD-10-CM | POA: Diagnosis not present

## 2021-06-11 LAB — CERVICOVAGINAL ANCILLARY ONLY
Chlamydia: NEGATIVE
Comment: NEGATIVE
Comment: NEGATIVE
Comment: NORMAL
Neisseria Gonorrhea: NEGATIVE
Trichomonas: NEGATIVE

## 2021-06-26 ENCOUNTER — Encounter: Payer: Medicaid Other | Admitting: Physical Medicine and Rehabilitation

## 2021-07-08 DIAGNOSIS — G8929 Other chronic pain: Secondary | ICD-10-CM | POA: Diagnosis not present

## 2021-07-08 DIAGNOSIS — U071 COVID-19: Secondary | ICD-10-CM | POA: Diagnosis not present

## 2021-07-08 DIAGNOSIS — G894 Chronic pain syndrome: Secondary | ICD-10-CM | POA: Diagnosis not present

## 2021-07-08 DIAGNOSIS — M545 Low back pain, unspecified: Secondary | ICD-10-CM | POA: Diagnosis not present

## 2021-07-27 DIAGNOSIS — Z0000 Encounter for general adult medical examination without abnormal findings: Secondary | ICD-10-CM | POA: Diagnosis not present

## 2021-07-27 DIAGNOSIS — R5383 Other fatigue: Secondary | ICD-10-CM | POA: Diagnosis not present

## 2021-07-27 DIAGNOSIS — E559 Vitamin D deficiency, unspecified: Secondary | ICD-10-CM | POA: Diagnosis not present

## 2021-07-27 DIAGNOSIS — M129 Arthropathy, unspecified: Secondary | ICD-10-CM | POA: Diagnosis not present

## 2021-08-12 DIAGNOSIS — Z79899 Other long term (current) drug therapy: Secondary | ICD-10-CM | POA: Diagnosis not present

## 2021-08-27 DIAGNOSIS — E559 Vitamin D deficiency, unspecified: Secondary | ICD-10-CM | POA: Diagnosis not present

## 2021-08-27 DIAGNOSIS — Z6834 Body mass index (BMI) 34.0-34.9, adult: Secondary | ICD-10-CM | POA: Diagnosis not present

## 2021-08-27 DIAGNOSIS — J4521 Mild intermittent asthma with (acute) exacerbation: Secondary | ICD-10-CM | POA: Diagnosis not present

## 2021-08-27 DIAGNOSIS — E782 Mixed hyperlipidemia: Secondary | ICD-10-CM | POA: Diagnosis not present

## 2021-08-27 DIAGNOSIS — F901 Attention-deficit hyperactivity disorder, predominantly hyperactive type: Secondary | ICD-10-CM | POA: Diagnosis not present

## 2021-09-10 DIAGNOSIS — Z79899 Other long term (current) drug therapy: Secondary | ICD-10-CM | POA: Diagnosis not present

## 2021-10-09 DIAGNOSIS — Z79899 Other long term (current) drug therapy: Secondary | ICD-10-CM | POA: Diagnosis not present

## 2021-10-27 DIAGNOSIS — R030 Elevated blood-pressure reading, without diagnosis of hypertension: Secondary | ICD-10-CM | POA: Diagnosis not present

## 2021-10-27 DIAGNOSIS — E559 Vitamin D deficiency, unspecified: Secondary | ICD-10-CM | POA: Diagnosis not present

## 2021-10-27 DIAGNOSIS — Z79899 Other long term (current) drug therapy: Secondary | ICD-10-CM | POA: Diagnosis not present

## 2021-10-27 DIAGNOSIS — E7800 Pure hypercholesterolemia, unspecified: Secondary | ICD-10-CM | POA: Diagnosis not present

## 2021-10-27 DIAGNOSIS — Z6834 Body mass index (BMI) 34.0-34.9, adult: Secondary | ICD-10-CM | POA: Diagnosis not present

## 2021-11-09 DIAGNOSIS — Z79899 Other long term (current) drug therapy: Secondary | ICD-10-CM | POA: Diagnosis not present

## 2021-11-17 ENCOUNTER — Telehealth: Payer: Self-pay | Admitting: Physical Medicine and Rehabilitation

## 2021-11-17 NOTE — Telephone Encounter (Signed)
Called pt and scheduled

## 2021-11-17 NOTE — Telephone Encounter (Signed)
Pt called requesting a call to set an appt. Please call pt at [phone].

## 2021-11-23 ENCOUNTER — Ambulatory Visit: Payer: Medicaid Other | Admitting: Physical Medicine and Rehabilitation

## 2021-11-27 ENCOUNTER — Encounter: Payer: Medicaid Other | Admitting: Physical Medicine and Rehabilitation

## 2021-12-04 ENCOUNTER — Telehealth: Payer: Self-pay | Admitting: Physical Medicine and Rehabilitation

## 2021-12-04 NOTE — Telephone Encounter (Signed)
done

## 2021-12-04 NOTE — Telephone Encounter (Signed)
Pt calling to get resch from previous appt for a NCS. The best call back number is [phone]

## 2021-12-09 DIAGNOSIS — Z20822 Contact with and (suspected) exposure to covid-19: Secondary | ICD-10-CM | POA: Diagnosis not present

## 2021-12-11 ENCOUNTER — Ambulatory Visit (INDEPENDENT_AMBULATORY_CARE_PROVIDER_SITE_OTHER): Payer: Medicaid Other | Admitting: Physical Medicine and Rehabilitation

## 2021-12-11 ENCOUNTER — Encounter: Payer: Self-pay | Admitting: Physical Medicine and Rehabilitation

## 2021-12-11 ENCOUNTER — Other Ambulatory Visit: Payer: Self-pay

## 2021-12-11 DIAGNOSIS — R202 Paresthesia of skin: Secondary | ICD-10-CM | POA: Diagnosis not present

## 2021-12-11 NOTE — Progress Notes (Signed)
Olivia Fuller - 36 y.o. female MRN [id number]  Date of birth: 1986/10/18  Office Visit Note: Visit Date: 12/11/2021 PCP: Jeanella Anton, NP Referred by: Carole Civil, MD  Subjective: Chief Complaint  Patient presents with   Right Hand - Pain   Left Hand - Pain   Right Wrist - Pain   Left Wrist - Pain   HPI:  Olivia Fuller is a 36 y.o. female who comes in today at the request of Dr. Arther Abbott for electrodiagnostic study of the Bilateral upper extremities.  Patient is Right hand dominant.  She reports chronic worsening severe bilateral hand pain with numbness tingling and burning sensations in a nondermatomal fashion in both hands.  She reports worsening when sitting and reading on her laptop.  She does have to shake her hands a lot when it feels this way.  She does note that she will get her nailbeds to turn blue and she does feel a feeling of coldness.  She does not rate her pain level very high in general but does take pain medication.  She reports it does affect her daily activities quite a bit.  She has had no prior electrodiagnostic studies.  ROS Otherwise per HPI.  Assessment & Plan: Visit Diagnoses:    ICD-10-CM   1. Paresthesia of skin  R20.2 NCV with EMG (electromyography)      Plan: mpression: Essentially NORMAL electrodiagnostic study of both upper limbs.  There is no significant electrodiagnostic evidence of nerve entrapment, brachial plexopathy or cervical radiculopathy.  As you know, purely sensory or demyelinating radiculopathies and chemical radiculitis may not be detected with this particular electrodiagnostic study.  Recommendations: 1.  Follow-up with referring physician. 2.  Continue current management of symptoms.  Meds & Orders: No orders of the defined types were placed in this encounter.   Orders Placed This Encounter  Procedures   NCV with EMG (electromyography)    Follow-up: Return in about 2 weeks (around 12/25/2021) for Arther Abbott, MD.   Procedures: No procedures performed  EMG & NCV Findings: All nerve conduction studies (as indicated in the following tables) were within normal limits.  All left vs. right side differences were within normal limits.    All examined muscles (as indicated in the following table) showed no evidence of electrical instability.    Impression: Essentially NORMAL electrodiagnostic study of both upper limbs.  There is no significant electrodiagnostic evidence of nerve entrapment, brachial plexopathy or cervical radiculopathy.  As you know, purely sensory or demyelinating radiculopathies and chemical radiculitis may not be detected with this particular electrodiagnostic study.  Recommendations: 1.  Follow-up with referring physician. 2.  Continue current management of symptoms.  ___________________________ Laurence Spates FAAPMR Board Certified, American Board of Physical Medicine and Rehabilitation    Nerve Conduction Studies Anti Sensory Summary Table   Stim Site NR Peak (ms) Norm Peak (ms) P-T Amp (V) Norm P-T Amp Site1 Site2 Delta-P (ms) Dist (cm) Vel (m/s) Norm Vel (m/s)  Left Median Acr Palm Anti Sensory (2nd Digit)  30.8C  Wrist    3.4 <3.6 36.0 >10 Wrist Palm 1.6 0.0    Palm    1.8 <2.0 36.8         Right Median Acr Palm Anti Sensory (2nd Digit)  30.8C  Wrist    3.4 <3.6 48.7 >10 Wrist Palm 1.6 0.0    Palm    1.8 <2.0 55.7         Left Radial Anti Sensory (  Base 1st Digit)  30.5C  Wrist    2.1 <3.1 41.7  Wrist Base 1st Digit 2.1 0.0    Right Radial Anti Sensory (Base 1st Digit)  30.9C  Wrist    2.2 <3.1 47.9  Wrist Base 1st Digit 2.2 0.0    Left Ulnar Anti Sensory (5th Digit)  30.9C  Wrist    3.3 <3.7 21.7 >15.0 Wrist 5th Digit 3.3 14.0 42 >38  Right Ulnar Anti Sensory (5th Digit)  31C  Wrist    3.3 <3.7 23.0 >15.0 Wrist 5th Digit 3.3 14.0 42 >38   Motor Summary Table   Stim Site NR Onset (ms) Norm Onset (ms) O-P Amp (mV) Norm O-P Amp Site1 Site2 Delta-0  (ms) Dist (cm) Vel (m/s) Norm Vel (m/s)  Left Median Motor (Abd Poll Brev)  30.3C  Wrist    3.1 <4.2 8.5 >5 Elbow Wrist 3.5 18.0 51 >50  Elbow    6.6  9.1         Right Median Motor (Abd Poll Brev)  30.9C  Wrist    3.0 <4.2 11.2 >5 Elbow Wrist 3.6 19.0 53 >50  Elbow    6.6  7.2         Left Ulnar Motor (Abd Dig Min)  30.3C  Wrist    3.0 <4.2 6.5 >3 B Elbow Wrist 2.9 18.0 62 >53  B Elbow    5.9  6.6  A Elbow B Elbow 1.3 10.0 77 >53  A Elbow    7.2  6.0         Right Ulnar Motor (Abd Dig Min)  30.9C  Wrist    2.8 <4.2 7.2 >3 B Elbow Wrist 3.1 19.0 61 >53  B Elbow    5.9  8.6  A Elbow B Elbow 1.6 10.0 63 >53  A Elbow    7.5  5.6          EMG   Side Muscle Nerve Root Ins Act Fibs Psw Amp Dur Poly Recrt Int Dennie Bible Comment  Right Abd Poll Brev Median C8-T1 Nml Nml Nml Nml Nml 0 Nml Nml   Right 1stDorInt Ulnar C8-T1 Nml Nml Nml Nml Nml 0 Nml Nml   Right PronatorTeres Median C6-7 Nml Nml Nml Nml Nml 0 Nml Nml   Right Biceps Musculocut C5-6 Nml Nml Nml Nml Nml 0 Nml Nml   Right Deltoid Axillary C5-6 Nml Nml Nml Nml Nml 0 Nml Nml     Nerve Conduction Studies Anti Sensory Left/Right Comparison   Stim Site L Lat (ms) R Lat (ms) L-R Lat (ms) L Amp (V) R Amp (V) L-R Amp (%) Site1 Site2 L Vel (m/s) R Vel (m/s) L-R Vel (m/s)  Median Acr Palm Anti Sensory (2nd Digit)  30.8C  Wrist 3.4 3.4 0.0 36.0 48.7 26.1 Wrist Palm     Palm 1.8 1.8 0.0 36.8 55.7 33.9       Radial Anti Sensory (Base 1st Digit)  30.5C  Wrist 2.1 2.2 0.1 41.7 47.9 12.9 Wrist Base 1st Digit     Ulnar Anti Sensory (5th Digit)  30.9C  Wrist 3.3 3.3 0.0 21.7 23.0 5.7 Wrist 5th Digit 42 42 0   Motor Left/Right Comparison   Stim Site L Lat (ms) R Lat (ms) L-R Lat (ms) L Amp (mV) R Amp (mV) L-R Amp (%) Site1 Site2 L Vel (m/s) R Vel (m/s) L-R Vel (m/s)  Median Motor (Abd Poll Brev)  30.3C  Wrist 3.1 3.0 0.1 8.5 11.2 24.1 Elbow  Wrist 51 53 2  Elbow 6.6 6.6 0.0 9.1 7.2 20.9       Ulnar Motor (Abd Dig Min)  30.3C  Wrist 3.0  2.8 0.2 6.5 7.2 9.7 B Elbow Wrist 62 61 1  B Elbow 5.9 5.9 0.0 6.6 8.6 23.3 A Elbow B Elbow 77 63 14  A Elbow 7.2 7.5 0.3 6.0 5.6 6.7          Waveforms:                      Clinical History: No specialty comments available.     Objective:  VS:  HT:     WT:    BMI:      BP:    HR: bpm   TEMP: ( )   RESP:  Physical Exam Musculoskeletal:        General: No swelling, tenderness or deformity.     Comments: Inspection reveals no atrophy of the bilateral APB or FDI or hand intrinsics. There is no swelling, color changes, allodynia or dystrophic changes. There is 5 out of 5 strength in the bilateral wrist extension, finger abduction and long finger flexion. There is intact sensation to light touch in all dermatomal and peripheral nerve distributions. There is a negative Hoffmann's test bilaterally.  Skin:    General: Skin is warm and dry.     Findings: No erythema or rash.  Neurological:     General: No focal deficit present.     Mental Status: She is alert and oriented to person, place, and time.     Motor: No weakness or abnormal muscle tone.     Coordination: Coordination normal.  Psychiatric:        Mood and Affect: Mood normal.        Behavior: Behavior normal.     Imaging: No results found.

## 2021-12-11 NOTE — Progress Notes (Signed)
Pt state both hands and wrist has numbness, burning and tingling. Pt state her hands goes cold and nail bed turns blue. Pt state when sitting and reading or at her laptop. Pt state she has to shake her hands and take pain meds to help ease her pain.  Numeric Pain Rating Scale and Functional Assessment Average Pain 2   In the last MONTH (on 0-10 scale) has pain interfered with the following?  1. General activity like being  able to carry out your everyday physical activities such as walking, climbing stairs, carrying groceries, or moving a chair?  Rating(10)    -BT, -Dye Allergies.

## 2021-12-16 DIAGNOSIS — Z79899 Other long term (current) drug therapy: Secondary | ICD-10-CM | POA: Diagnosis not present

## 2021-12-17 NOTE — Procedures (Signed)
EMG & NCV Findings: All nerve conduction studies (as indicated in the following tables) were within normal limits.  All left vs. right side differences were within normal limits.    All examined muscles (as indicated in the following table) showed no evidence of electrical instability.    Impression: Essentially NORMAL electrodiagnostic study of both upper limbs.  There is no significant electrodiagnostic evidence of nerve entrapment, brachial plexopathy or cervical radiculopathy.  As you know, purely sensory or demyelinating radiculopathies and chemical radiculitis may not be detected with this particular electrodiagnostic study.  Recommendations: 1.  Follow-up with referring physician. 2.  Continue current management of symptoms.  ___________________________ Olivia Fuller FAAPMR Board Certified, American Board of Physical Medicine and Rehabilitation    Nerve Conduction Studies Anti Sensory Summary Table   Stim Site NR Peak (ms) Norm Peak (ms) P-T Amp (V) Norm P-T Amp Site1 Site2 Delta-P (ms) Dist (cm) Vel (m/s) Norm Vel (m/s)  Left Median Acr Palm Anti Sensory (2nd Digit)  30.8C  Wrist    3.4 <3.6 36.0 >10 Wrist Palm 1.6 0.0    Palm    1.8 <2.0 36.8         Right Median Acr Palm Anti Sensory (2nd Digit)  30.8C  Wrist    3.4 <3.6 48.7 >10 Wrist Palm 1.6 0.0    Palm    1.8 <2.0 55.7         Left Radial Anti Sensory (Base 1st Digit)  30.5C  Wrist    2.1 <3.1 41.7  Wrist Base 1st Digit 2.1 0.0    Right Radial Anti Sensory (Base 1st Digit)  30.9C  Wrist    2.2 <3.1 47.9  Wrist Base 1st Digit 2.2 0.0    Left Ulnar Anti Sensory (5th Digit)  30.9C  Wrist    3.3 <3.7 21.7 >15.0 Wrist 5th Digit 3.3 14.0 42 >38  Right Ulnar Anti Sensory (5th Digit)  31C  Wrist    3.3 <3.7 23.0 >15.0 Wrist 5th Digit 3.3 14.0 42 >38   Motor Summary Table   Stim Site NR Onset (ms) Norm Onset (ms) O-P Amp (mV) Norm O-P Amp Site1 Site2 Delta-0 (ms) Dist (cm) Vel (m/s) Norm Vel (m/s)  Left Median Motor  (Abd Poll Brev)  30.3C  Wrist    3.1 <4.2 8.5 >5 Elbow Wrist 3.5 18.0 51 >50  Elbow    6.6  9.1         Right Median Motor (Abd Poll Brev)  30.9C  Wrist    3.0 <4.2 11.2 >5 Elbow Wrist 3.6 19.0 53 >50  Elbow    6.6  7.2         Left Ulnar Motor (Abd Dig Min)  30.3C  Wrist    3.0 <4.2 6.5 >3 B Elbow Wrist 2.9 18.0 62 >53  B Elbow    5.9  6.6  A Elbow B Elbow 1.3 10.0 77 >53  A Elbow    7.2  6.0         Right Ulnar Motor (Abd Dig Min)  30.9C  Wrist    2.8 <4.2 7.2 >3 B Elbow Wrist 3.1 19.0 61 >53  B Elbow    5.9  8.6  A Elbow B Elbow 1.6 10.0 63 >53  A Elbow    7.5  5.6          EMG   Side Muscle Nerve Root Ins Act Fibs Psw Amp Dur Poly Recrt Int Dennie Bible Comment  Right Abd [employer]  Brev Median C8-T1 Nml Nml Nml Nml Nml 0 Nml Nml   Right 1stDorInt Ulnar C8-T1 Nml Nml Nml Nml Nml 0 Nml Nml   Right PronatorTeres Median C6-7 Nml Nml Nml Nml Nml 0 Nml Nml   Right Biceps Musculocut C5-6 Nml Nml Nml Nml Nml 0 Nml Nml   Right Deltoid Axillary C5-6 Nml Nml Nml Nml Nml 0 Nml Nml     Nerve Conduction Studies Anti Sensory Left/Right Comparison   Stim Site L Lat (ms) R Lat (ms) L-R Lat (ms) L Amp (V) R Amp (V) L-R Amp (%) Site1 Site2 L Vel (m/s) R Vel (m/s) L-R Vel (m/s)  Median Acr Palm Anti Sensory (2nd Digit)  30.8C  Wrist 3.4 3.4 0.0 36.0 48.7 26.1 Wrist Palm     Palm 1.8 1.8 0.0 36.8 55.7 33.9       Radial Anti Sensory (Base 1st Digit)  30.5C  Wrist 2.1 2.2 0.1 41.7 47.9 12.9 Wrist Base 1st Digit     Ulnar Anti Sensory (5th Digit)  30.9C  Wrist 3.3 3.3 0.0 21.7 23.0 5.7 Wrist 5th Digit 42 42 0   Motor Left/Right Comparison   Stim Site L Lat (ms) R Lat (ms) L-R Lat (ms) L Amp (mV) R Amp (mV) L-R Amp (%) Site1 Site2 L Vel (m/s) R Vel (m/s) L-R Vel (m/s)  Median Motor (Abd Poll Brev)  30.3C  Wrist 3.1 3.0 0.1 8.5 11.2 24.1 Elbow Wrist 51 53 2  Elbow 6.6 6.6 0.0 9.1 7.2 20.9       Ulnar Motor (Abd Dig Min)  30.3C  Wrist 3.0 2.8 0.2 6.5 7.2 9.7 B Elbow Wrist 62 61 1  B Elbow 5.9 5.9  0.0 6.6 8.6 23.3 A Elbow B Elbow 77 63 14  A Elbow 7.2 7.5 0.3 6.0 5.6 6.7          Waveforms:

## 2021-12-25 DIAGNOSIS — Z6834 Body mass index (BMI) 34.0-34.9, adult: Secondary | ICD-10-CM | POA: Diagnosis not present

## 2021-12-25 DIAGNOSIS — Z79899 Other long term (current) drug therapy: Secondary | ICD-10-CM | POA: Diagnosis not present

## 2021-12-25 DIAGNOSIS — Z6833 Body mass index (BMI) 33.0-33.9, adult: Secondary | ICD-10-CM | POA: Diagnosis not present

## 2021-12-25 DIAGNOSIS — R030 Elevated blood-pressure reading, without diagnosis of hypertension: Secondary | ICD-10-CM | POA: Diagnosis not present

## 2021-12-25 DIAGNOSIS — F901 Attention-deficit hyperactivity disorder, predominantly hyperactive type: Secondary | ICD-10-CM | POA: Diagnosis not present

## 2021-12-25 DIAGNOSIS — E7800 Pure hypercholesterolemia, unspecified: Secondary | ICD-10-CM | POA: Diagnosis not present

## 2021-12-31 ENCOUNTER — Ambulatory Visit: Payer: Medicaid Other | Admitting: Orthopedic Surgery

## 2021-12-31 ENCOUNTER — Telehealth: Payer: Self-pay | Admitting: Radiology

## 2021-12-31 ENCOUNTER — Encounter: Payer: Self-pay | Admitting: Orthopedic Surgery

## 2021-12-31 ENCOUNTER — Other Ambulatory Visit: Payer: Self-pay

## 2021-12-31 DIAGNOSIS — E539 Vitamin B deficiency, unspecified: Secondary | ICD-10-CM | POA: Diagnosis not present

## 2021-12-31 DIAGNOSIS — R202 Paresthesia of skin: Secondary | ICD-10-CM

## 2021-12-31 DIAGNOSIS — I7300 Raynaud's syndrome without gangrene: Secondary | ICD-10-CM

## 2021-12-31 NOTE — Progress Notes (Signed)
Chief Complaint  Patient presents with   Results    Review nerve study bilateral hands    Encounter Diagnoses  Name Primary?   Paresthesia of hand, bilateral Yes   Raynaud's phenomenon without gangrene    Vitamin B deficiency      Olivia Fuller had nerve conduction studies for the numbness and tingling that she feels in her both hands but the nerve studies were normal  Speaking to her more she says that she gets discoloration and cool to cold feeling in the hands and feet when the numbness occurs  She may have Raynaud's disease or phenomenon  She is no longer a smoker quit in 2013  Recommend that she see hand surgeon for this as well as a neurologist to manage this and work-up for metabolic deficiency.

## 2021-12-31 NOTE — Telephone Encounter (Signed)
-----   Message from Stanley E Harrison, MD sent at 12/31/2021 10:31 AM EST ----- °Set up a consult for Dr. Benfield to evaluate the patient for Raynaud's disease ° °Also set up appoint with Dr. Doonquah to have the patient work-up for metabolic deficiency leading to paresthesias ° °

## 2021-12-31 NOTE — Telephone Encounter (Signed)
Done

## 2021-12-31 NOTE — Telephone Encounter (Signed)
-----   Message from Vickki Hearing, MD sent at 12/31/2021 10:31 AM EST ----- Set up a consult for Dr. Frazier Butt to evaluate the patient for Raynaud's disease  Also set up appoint with Dr. Gerilyn Pilgrim to have the patient work-up for metabolic deficiency leading to paresthesias

## 2022-01-06 DIAGNOSIS — Z79899 Other long term (current) drug therapy: Secondary | ICD-10-CM | POA: Diagnosis not present

## 2022-01-08 ENCOUNTER — Ambulatory Visit: Payer: Medicaid Other | Admitting: Orthopedic Surgery

## 2022-01-11 ENCOUNTER — Encounter: Payer: Self-pay | Admitting: Orthopedic Surgery

## 2022-01-11 ENCOUNTER — Other Ambulatory Visit: Payer: Self-pay

## 2022-01-11 ENCOUNTER — Ambulatory Visit (INDEPENDENT_AMBULATORY_CARE_PROVIDER_SITE_OTHER): Payer: Medicaid Other | Admitting: Orthopedic Surgery

## 2022-01-11 DIAGNOSIS — R200 Anesthesia of skin: Secondary | ICD-10-CM | POA: Diagnosis not present

## 2022-01-11 NOTE — Progress Notes (Signed)
Office Visit Note   Patient: Olivia Fuller           Date of Birth: 01-05-1986           MRN: [id number] Visit Date: 01/11/2022              Requested by: Vickki Hearing, MD [address] [place],  [place] [address] PCP: Merryl Hacker, NP   Assessment & Plan: Visit Diagnoses:  1. Numbness in both hands     Plan: Discussed with patient that her story, symptoms, exam, and nerve conduction study findings are not consistent with typical carpal tunnel syndrome.  Her symptoms seem to be related to stress.  She also has the exact same symptoms in bilateral feet.  She has been referred to neurology for further work-up which I agree with.  I can see her back as needed.  Follow-Up Instructions: No follow-ups on file.   Orders:  No orders of the defined types were placed in this encounter.  No orders of the defined types were placed in this encounter.     Procedures: No procedures performed   Clinical Data: No additional findings.   Subjective: Chief Complaint  Patient presents with   Left Hand - Pain, Numbness   Right Hand - Pain, Numbness    There is a 36 year old right-hand-dominant female who presents with bilateral hand numbness and tingling for a year or so.  She thinks is gotten worse over the last 3 months.  She says she feels tingling that radiates down all of her fingers.  She has no involvement of her palm.  This happens she says her nailbeds turn blue and her fingers get cold.  Her fingers never actually changed color just the nailbed itself.  This is brought on mostly by stress.  She thinks her symptoms also happen with prolonged activity but stress seems to be biggest trigger.  She underwent EMG/nerve conduction study which was found to be normal.  She tried night braces which actually made her symptoms worse.  She also has the exact symptoms in bilateral feet including the numbness in her toes and the discoloration of her nailbeds.  She has some history  of back issues with the previous surgery for a disc herniation per patient.   Review of Systems   Objective: Vital Signs: BP 139/83    Pulse 83   Physical Exam Constitutional:      Appearance: Normal appearance.  Cardiovascular:     Rate and Rhythm: Normal rate.     Pulses: Normal pulses.  Pulmonary:     Effort: Pulmonary effort is normal.  Skin:    General: Skin is warm and dry.     Capillary Refill: Capillary refill takes less than 2 seconds.  Neurological:     Mental Status: She is alert.    Right Hand Exam   Tenderness  The patient is experiencing no tenderness.   Range of Motion  The patient has normal right wrist ROM.   Muscle Strength  The patient has normal right wrist strength.  Other  Erythema: absent Sensation: normal Pulse: present  Comments:  Negative Tinel or Phalen at wrist.  Negative Tinel at elbow.  Fingers warm and well perfused. SILT in median, ulnar, radial distributions.    Left Hand Exam   Tenderness  The patient is experiencing no tenderness.   Range of Motion  The patient has normal left wrist ROM.  Muscle Strength  The patient has normal left wrist  strength.  Other  Erythema: absent Pulse: present  Comments:  Negative Tinel or Phalen at wrist.  Negative Tinel at elbow.  Fingers warm and well perfused. SILT in median, ulnar, radial distributions.      Specialty Comments:  No specialty comments available.  Imaging: No results found.   PMFS History: Patient Active Problem List   Diagnosis Date Noted   Numbness in both hands 01/11/2022   Chronic prescription opiate use 03/09/2017   History of infertility 03/09/2017   Family history of aortic stenosis 03/09/2017   Daughter w/ congenital urinary reflux 03/09/2017   History of shoulder dystocia in prior pregnancy 08/10/2016   Marijuana use 01/06/2016   Anxiety 11/11/2015   Left wrist pain 04/25/2015   ALLERGIC RHINITIS 11/24/2010   ASTHMA 11/24/2010   GERD  11/24/2010   DYSPNEA 11/24/2010   CHEST PAIN, ATYPICAL, HX OF 11/24/2010   Past Medical History:  Diagnosis Date   Anxiety    Asthma    DDD (degenerative disc disease)    Hypercholesteremia    Scoliosis    UTI (lower urinary tract infection)     Family History  Problem Relation Age of Onset   Seizures Mother    Alcohol abuse Mother    Drug abuse Mother    Bipolar disorder Father    Drug abuse Brother    Alzheimer's disease Maternal Grandmother    Lumbar disc disease Maternal Grandmother    Cancer Paternal Grandmother        colon   Hyperlipidemia Paternal Grandmother    Hypertension Paternal Grandmother    Cancer Paternal Grandfather        prostate, bone   Diabetes Paternal Grandfather    Heart disease Paternal Grandfather    Other Daughter        acid reflux    Past Surgical History:  Procedure Laterality Date   BACK SURGERY     LUMBAR LAMINECTOMY/DECOMPRESSION MICRODISCECTOMY  07/06/2012   Procedure: LUMBAR LAMINECTOMY/DECOMPRESSION MICRODISCECTOMY 1 LEVEL;  Surgeon: Cristi Loron, MD;  Location: [facility];  Service: Neurosurgery;  Laterality: Left;  LEFT Lumbar Five-Sacral One diskectomy   No Surgical History     Social History   Occupational History   Not on file  Tobacco Use   Smoking status: Former    Years: 5.00    Types: Cigarettes    Quit date: 02/14/2012    Years since quitting: 9.9   Smokeless tobacco: Former  Building services engineer Use: Never used  Substance and Sexual Activity   Alcohol use: No   Drug use: No    Types: Marijuana   Sexual activity: Yes    Birth control/protection: None

## 2022-03-24 ENCOUNTER — Encounter: Payer: Self-pay | Admitting: Emergency Medicine

## 2022-03-24 ENCOUNTER — Other Ambulatory Visit: Payer: Self-pay

## 2022-03-24 ENCOUNTER — Ambulatory Visit
Admission: EM | Admit: 2022-03-24 | Discharge: 2022-03-24 | Disposition: A | Discharge status: Home / Self Care | Payer: Medicaid Other | Attending: Family Medicine | Admitting: Family Medicine

## 2022-03-24 DIAGNOSIS — L089 Local infection of the skin and subcutaneous tissue, unspecified: Secondary | ICD-10-CM

## 2022-03-24 DIAGNOSIS — B9689 Other specified bacterial agents as the cause of diseases classified elsewhere: Secondary | ICD-10-CM | POA: Diagnosis not present

## 2022-03-24 MED ORDER — DOXYCYCLINE HYCLATE 100 MG PO CAPS: 100.0000 mg | ORAL_CAPSULE | Freq: Two times a day (BID) | ORAL | 0 refills | Status: DC

## 2022-03-24 MED ORDER — CEFTRIAXONE SODIUM 1 G IJ SOLR
1.0000 g | Freq: Once | INTRAMUSCULAR | Status: AC
  Administered 2022-03-24: 1 g via INTRAMUSCULAR

## 2022-03-24 NOTE — Discharge Instructions (Signed)
Meds ordered this encounter  ?Medications  ? cefTRIAXone (ROCEPHIN) injection 1 g  ? doxycycline (VIBRAMYCIN) 100 MG capsule  ?  Sig: Take 1 capsule (100 mg total) by mouth 2 (two) times daily.  ?  Dispense:  14 capsule  ?  Refill:  0  ? ? ?

## 2022-03-24 NOTE — ED Provider Notes (Signed)
?[facility] ? ? ?[id number] ?03/24/22 Arrival Time: 9678 ? ?ASSESSMENT & PLAN: ? ?1. Localized bacterial infection of skin   ? ?No areas of fluctuance. Do not feel I&D warranted at this point. Hopefully catching this early enough that antibiotics will help. Suspect underlying epidermoid cyst that may need surgical f/u in the future. ?No signs of orbital//peri-orbital cellulitis. ? ?Meds ordered this encounter  ?Medications  ? cefTRIAXone (ROCEPHIN) injection 1 g  ? doxycycline (VIBRAMYCIN) 100 MG capsule  ?  Sig: Take 1 capsule (100 mg total) by mouth 2 (two) times daily.  ?  Dispense:  14 capsule  ?  Refill:  0  ? ? Follow-up Information   ? ? [facility].   ?Specialty: Urgent Care ?Why: If worsening or failing to improve as anticipated. ?Contact information: ?[address] ?[place] [address] ?[phone] ? ?  ?  ? ?  ?  ? ?  ? ?Reviewed expectations re: course of current medical issues. Questions answered. ?Outlined signs and symptoms indicating need for more acute intervention. ?Patient verbalized understanding. ?After Visit Summary given. ? ? ?SUBJECTIVE: ?History from: patient. ? ?Olivia Fuller is a 36 y.o. female who presents with complaint of swelling and redness between eyes; gradual onset over past 3 months; current redness over past week or so. Now tender. Afebrile. No drainage or bleeding. No pain with eye movements. No visual changes. ? ?Social History  ? ?Tobacco Use  ?Smoking Status Former  ? Years: 5.00  ? Types: Cigarettes  ? Quit date: 02/14/2012  ? Years since quitting: 10.1  ?Smokeless Tobacco Former  ? ? ?OBJECTIVE: ? ?Vitals:  ? 03/24/22 0810 03/24/22 0811  ?BP: 112/79   ?Pulse: (!) 107   ?Resp: 18   ?Temp: 98.6 ?F (37 ?C)   ?TempSrc: Oral   ?SpO2: 97%   ?Weight:  97.5 kg  ?Height:  5\' 7"  (1.702 m)  ?  ?Slight tachycardia noted. ?General appearance: alert; appears fatigued ?HEENT: [place]; AT; mild erythema and skin thickening at  bridge of nose extending toward forehead; measures approx 1.5 cm; no areas of fluctuance; no drainage or bleeding ?Neck: supple without LAD ?Lungs: unlabored respirations ?Skin: warm and dry ?Psychological: alert and cooperative; normal mood and affect ? ?Allergies  ?Allergen Reactions  ? Tylenol [Acetaminophen] Itching and Nausea And Vomiting  ? Zofran [Ondansetron Hcl] Hives and Itching  ? ? ?Past Medical History:  ?Diagnosis Date  ? Anxiety   ? Asthma   ? DDD (degenerative disc disease)   ? Hypercholesteremia   ? Scoliosis   ? UTI (lower urinary tract infection)   ? ?Family History  ?Problem Relation Age of Onset  ? Seizures Mother   ? Alcohol abuse Mother   ? Drug abuse Mother   ? Bipolar disorder Father   ? Drug abuse Brother   ? Alzheimer's disease Maternal Grandmother   ? Lumbar disc disease Maternal Grandmother   ? Cancer Paternal Grandmother   ?     colon  ? Hyperlipidemia Paternal Grandmother   ? Hypertension Paternal Grandmother   ? Cancer Paternal Grandfather   ?     prostate, bone  ? Diabetes Paternal Grandfather   ? Heart disease Paternal Grandfather   ? Other Daughter   ?     acid reflux  ? ?Social History  ? ?Socioeconomic History  ? Marital status: Single  ?  Spouse name: Not on file  ? Number of children: Not on  file  ? Years of education: Not on file  ? Highest education level: Not on file  ?Occupational History  ? Not on file  ?Tobacco Use  ? Smoking status: Former  ?  Years: 5.00  ?  Types: Cigarettes  ?  Quit date: 02/14/2012  ?  Years since quitting: 10.1  ? Smokeless tobacco: Former  ?Vaping Use  ? Vaping Use: Never used  ?Substance and Sexual Activity  ? Alcohol use: No  ? Drug use: No  ?  Types: Marijuana  ? Sexual activity: Yes  ?  Birth control/protection: None  ?Other Topics Concern  ? Not on file  ?Social History Narrative  ? Not on file  ? ?Social Determinants of Health  ? ?Financial Resource Strain: Not on file  ?Food Insecurity: Not on file  ?Transportation Needs: Not on file   ?Physical Activity: Not on file  ?Stress: Not on file  ?Social Connections: Not on file  ?Intimate Partner Violence: Not on file  ? ? ? ? ? ? ? ? ? ?  ?Mardella Layman, MD ?03/24/22 0831 ? ?

## 2022-03-24 NOTE — ED Triage Notes (Signed)
Pt reports over last several months had had "spot that started as a pimple" between eyes. Pt reports site is now hard with swelling and redness that started a few days ago and reports pain "is now going into my eyes."  ?

## 2023-01-11 DIAGNOSIS — F1120 Opioid dependence, uncomplicated: Secondary | ICD-10-CM | POA: Diagnosis not present

## 2023-01-11 DIAGNOSIS — R030 Elevated blood-pressure reading, without diagnosis of hypertension: Secondary | ICD-10-CM | POA: Diagnosis not present

## 2023-01-11 DIAGNOSIS — F901 Attention-deficit hyperactivity disorder, predominantly hyperactive type: Secondary | ICD-10-CM | POA: Diagnosis not present

## 2023-02-03 ENCOUNTER — Encounter: Payer: Self-pay | Admitting: Radiology

## 2023-05-09 ENCOUNTER — Encounter: Payer: Self-pay | Admitting: Obstetrics & Gynecology

## 2023-05-09 ENCOUNTER — Ambulatory Visit (INDEPENDENT_AMBULATORY_CARE_PROVIDER_SITE_OTHER): Payer: 59 | Admitting: Obstetrics & Gynecology

## 2023-05-09 ENCOUNTER — Other Ambulatory Visit (HOSPITAL_COMMUNITY)
Admission: RE | Admit: 2023-05-09 | Discharge: 2023-05-09 | Disposition: A | Discharge status: Home / Self Care | Payer: 59 | Source: Ambulatory Visit | Attending: Obstetrics & Gynecology | Admitting: Obstetrics & Gynecology

## 2023-05-09 VITALS — BP 123/81 | HR 120 | Ht 67.0 in | Wt 211.4 lb

## 2023-05-09 DIAGNOSIS — Z01419 Encounter for gynecological examination (general) (routine) without abnormal findings: Secondary | ICD-10-CM | POA: Insufficient documentation

## 2023-05-09 DIAGNOSIS — Z8742 Personal history of other diseases of the female genital tract: Secondary | ICD-10-CM | POA: Diagnosis not present

## 2023-05-09 NOTE — Progress Notes (Signed)
   WELL-WOMAN EXAMINATION Patient name: Olivia Fuller MRN [id number]  Date of birth: March 01, 1986 Chief Complaint:   Gynecologic Exam  History of Present Illness:   Olivia Fuller is a 37 y.o. G29P1001 female being seen today for a routine well-woman exam.   Menses last for about 4 days and typically around the same time.  Denies significant dysmenorrhea or intermenstrual bleeding.  Pt would consider another pregnancy- prior pregnancy required REI/ART. 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  Recommended f/u with REI for further treatment.  Patient's last menstrual period was 05/08/2023. Denies issues with her menses The current method of family planning is none.    Last pap 2021.  Last mammogram: n/a. Last colonoscopy: n/a     05/09/2023    8:37 AM 08/07/2020    8:42 AM  Depression screen PHQ 2/9  Decreased Interest 0 0  Down, Depressed, Hopeless 0 0  PHQ - 2 Score 0 0  Altered sleeping 0 0  Tired, decreased energy 0 0  Change in appetite 0 0  Feeling bad or failure about yourself  0 0  Trouble concentrating 0 0  Moving slowly or fidgety/restless 0 0  Suicidal thoughts 0 0  PHQ-9 Score 0 0      Review of Systems:   Pertinent items are noted in HPI Denies any headaches, blurred vision, fatigue, shortness of breath, chest pain, abdominal pain, bowel movements, urination, or intercourse unless otherwise stated above.  Pertinent History Reviewed:  Reviewed past medical,surgical, social and family history.  Reviewed problem list, medications and allergies. Physical Assessment:   Vitals:   05/09/23 0832  BP: 123/81  Pulse: (!) 120  Weight: 211 lb 6.4 oz (95.9 kg)  Height: 5\' 7"  (1.702 m)  Body mass index is 33.11 kg/m.        Physical Examination:   General appearance - well appearing, and in no distress  Mental status - alert, oriented to person, place, and time  Psych:   She has a normal mood and affect  Skin - warm and dry, normal color, no suspicious lesions noted  Chest - effort normal, all lung fields clear to auscultation bilaterally  Heart - normal rate and regular rhythm  Neck:  midline trachea, no thyromegaly or nodules  Breasts - breasts appear normal, no suspicious masses, no skin or nipple changes or  axillary nodes  Abdomen - soft, nontender, nondistended, no masses or organomegaly  Pelvic - VULVA: normal appearing vulva with no masses, tenderness or lesions  VAGINA: normal appearing vagina with normal color and discharge, no lesions  CERVIX: normal appearing cervix without discharge or lesions, no CMT  Thin prep pap is done with HR HPV cotesting  UTERUS: uterus is felt to be normal size, shape, consistency and nontender   ADNEXA: No adnexal masses or tenderness noted.  Extremities:  No swelling or varicosities noted  Chaperone:  Dr. Quincy Simmonds      Assessment & Plan:  1) Well-Woman Exam Pap collected, reviewed ASCCP guidelines  2) Infertility -management per REI   Meds: No orders of the defined types were placed in this encounter.   Follow-up: Return in about 1 year (around 05/08/2024) for Annual.   Myna Hidalgo, DO Attending Obstetrician & Gynecologist, [facility], [facility]

## 2023-05-11 LAB — CYTOLOGY - PAP
Chlamydia: NEGATIVE
Comment: NEGATIVE
Comment: NEGATIVE
Comment: NORMAL
Diagnosis: NEGATIVE
High risk HPV: NEGATIVE
Neisseria Gonorrhea: NEGATIVE

## 2024-05-16 ENCOUNTER — Encounter: Payer: Self-pay | Admitting: Orthopaedic Surgery

## 2024-05-16 ENCOUNTER — Ambulatory Visit: Admitting: Orthopaedic Surgery

## 2024-05-16 ENCOUNTER — Other Ambulatory Visit (INDEPENDENT_AMBULATORY_CARE_PROVIDER_SITE_OTHER)

## 2024-05-16 DIAGNOSIS — M79671 Pain in right foot: Secondary | ICD-10-CM | POA: Diagnosis not present

## 2024-05-16 NOTE — Progress Notes (Signed)
 I hurt my foot.  She injured her right foot about two weeks ago.  She has pain of the first distal metatarsal area.  She went to [facility].  X-rays were done. She was told she had a fracture and given a CAM walker.  She still has some pain.  She has no redness, no numbness.  I have reviewed the X-rays from [facility].  I find them negative for fracture.  I have reviewed the notes also.  Right foot is tender over the lateral first metatarsal head area.  She has no redness.  ROM of the ankle and toes is normal.  NV intact.  She has slight limp to the right.  She denies history of gout in family.  X-rays were done of the right foot today, reported separately.  Negative for fracture.  Encounter Diagnosis  Name Primary?   Pain in right foot Yes   I have given contrast bath sheet and told her how to do the baths.  I have told her to use ibuprofen  or Aleve.  I have told her to use Aspercreme, Biofreeze or Voltaren gel as needed.  Return in one week.  If not improved, I will get serum uric acid level.  Call if any problem.  Precautions discussed.  Electronically Signed Pleasant Brilliant, MD 6/11/202510:57 AM

## 2024-05-23 ENCOUNTER — Encounter: Payer: Self-pay | Admitting: Orthopaedic Surgery

## 2024-05-23 ENCOUNTER — Ambulatory Visit: Admitting: Orthopaedic Surgery

## 2024-05-23 VITALS — BP 107/75 | HR 97 | Ht 67.0 in | Wt 211.0 lb

## 2024-05-23 DIAGNOSIS — M79671 Pain in right foot: Secondary | ICD-10-CM

## 2024-05-23 NOTE — Patient Instructions (Addendum)
 Follow up with Dr. Iline Mallory in 2 weeks.   Please take the order to [facility] to [address] [place].

## 2024-05-23 NOTE — Progress Notes (Signed)
 My foot still hurts.  She has continued pain and swelling of the MTP joint of the right foot.  I am concerned about gout.  She has used ice, elevation and decreased activity.  She denies gout in the family.  The right foot has swelling and very slight redness of the MTP area, it is tender.  NV intact.    Encounter Diagnosis  Name Primary?   Pain in right foot Yes   I will get serum uric acid done.  Return in two weeks.  Call if any problem.  Precautions discussed.  Electronically Signed Pleasant Brilliant, MD 6/18/202510:33 AM

## 2024-06-05 LAB — URIC ACID: Uric Acid, Serum: 4.3 mg/dL (ref 2.5–7.0)

## 2024-06-06 ENCOUNTER — Ambulatory Visit: Admitting: Orthopaedic Surgery

## 2024-06-06 ENCOUNTER — Encounter: Payer: Self-pay | Admitting: Orthopaedic Surgery

## 2024-06-06 VITALS — BP 142/91 | HR 80 | Ht 67.0 in | Wt 211.0 lb

## 2024-06-06 DIAGNOSIS — M79671 Pain in right foot: Secondary | ICD-10-CM | POA: Diagnosis not present

## 2024-06-06 MED ORDER — PREDNISONE 5 MG (21) PO TBPK: ORAL_TABLET | ORAL | 0 refills | Status: AC

## 2024-06-06 NOTE — Progress Notes (Signed)
 My foot hurts some still.  Her uric acid was 4.3, normal.  She has less pain in the right foot but it still hurts and swells.  She has no new trauma.  She has no change in shoe wear.  Right foot is tender more in the forefoot area with slight swelling, no redness.  NV intact.  ROM of the ankle is full.  Encounter Diagnosis  Name Primary?   Pain in right foot Yes   I will give prednisone  dose pack.  Return in two weeks.  Call if any problem.  Precautions discussed.  Electronically Signed Lemond Stable, MD 7/2/20258:36 AM

## 2024-06-20 ENCOUNTER — Ambulatory Visit: Admitting: Orthopaedic Surgery

## 2024-07-27 ENCOUNTER — Encounter: Payer: Self-pay | Admitting: Radiology

## 2024-10-08 ENCOUNTER — Encounter: Payer: Self-pay | Admitting: Radiology
